# Patient Record
Sex: Female | Born: 1975
Health system: Southern US, Community
[De-identification: ages and names within clinical notes are randomized; demographics above are authoritative.]

## PROBLEM LIST (undated history)

## (undated) DIAGNOSIS — R454 Irritability and anger: Secondary | ICD-10-CM

## (undated) DIAGNOSIS — M545 Low back pain, unspecified: Secondary | ICD-10-CM

## (undated) DIAGNOSIS — O3410 Maternal care for benign tumor of corpus uteri, unspecified trimester: Secondary | ICD-10-CM

## (undated) DIAGNOSIS — D259 Leiomyoma of uterus, unspecified: Secondary | ICD-10-CM

## (undated) DIAGNOSIS — F419 Anxiety disorder, unspecified: Secondary | ICD-10-CM

## (undated) DIAGNOSIS — I10 Essential (primary) hypertension: Secondary | ICD-10-CM

## (undated) DIAGNOSIS — F32A Depression, unspecified: Secondary | ICD-10-CM

## (undated) DIAGNOSIS — F439 Reaction to severe stress, unspecified: Secondary | ICD-10-CM

## (undated) DIAGNOSIS — J45909 Unspecified asthma, uncomplicated: Secondary | ICD-10-CM

## (undated) DIAGNOSIS — G43909 Migraine, unspecified, not intractable, without status migrainosus: Secondary | ICD-10-CM

## (undated) HISTORY — PX: TONSILLECTOMY: SUR1361

## (undated) HISTORY — PX: ABDOMINAL HYSTERECTOMY: SUR658

## (undated) HISTORY — PX: MYOMECTOMY: SHX85

## (undated) HISTORY — DX: Irritability and anger: R45.4

## (undated) HISTORY — DX: Depression, unspecified: F32.A

## (undated) HISTORY — DX: Anxiety disorder, unspecified: F41.9

## (undated) HISTORY — PX: ABDOMINAL HYSTERECTOMY: SHX81

---

## 2000-08-20 ENCOUNTER — Encounter: Payer: Self-pay | Admitting: Emergency Medicine

## 2000-08-20 ENCOUNTER — Emergency Department (HOSPITAL_COMMUNITY): Admission: EM | Admit: 2000-08-20 | Discharge: 2000-08-20 | Payer: Self-pay | Admitting: Emergency Medicine

## 2001-04-17 ENCOUNTER — Emergency Department (HOSPITAL_COMMUNITY): Admission: EM | Admit: 2001-04-17 | Discharge: 2001-04-17 | Payer: Self-pay | Admitting: Emergency Medicine

## 2001-06-28 ENCOUNTER — Emergency Department (HOSPITAL_COMMUNITY): Admission: EM | Admit: 2001-06-28 | Discharge: 2001-06-29 | Payer: Self-pay | Admitting: Emergency Medicine

## 2001-07-16 ENCOUNTER — Encounter: Admission: RE | Admit: 2001-07-16 | Discharge: 2001-07-16 | Payer: Self-pay | Admitting: Internal Medicine

## 2001-07-19 ENCOUNTER — Ambulatory Visit (HOSPITAL_COMMUNITY): Admission: RE | Admit: 2001-07-19 | Discharge: 2001-07-19 | Payer: Self-pay | Admitting: Internal Medicine

## 2001-07-19 ENCOUNTER — Encounter: Payer: Self-pay | Admitting: Internal Medicine

## 2001-10-06 ENCOUNTER — Emergency Department (HOSPITAL_COMMUNITY): Admission: EM | Admit: 2001-10-06 | Discharge: 2001-10-06 | Payer: Self-pay

## 2001-10-07 ENCOUNTER — Emergency Department (HOSPITAL_COMMUNITY): Admission: EM | Admit: 2001-10-07 | Discharge: 2001-10-08 | Payer: Self-pay | Admitting: Emergency Medicine

## 2001-10-21 ENCOUNTER — Other Ambulatory Visit: Admission: RE | Admit: 2001-10-21 | Discharge: 2001-10-21 | Payer: Self-pay | Admitting: Obstetrics and Gynecology

## 2001-11-25 ENCOUNTER — Emergency Department (HOSPITAL_COMMUNITY): Admission: EM | Admit: 2001-11-25 | Discharge: 2001-11-25 | Payer: Self-pay | Admitting: Emergency Medicine

## 2001-12-02 ENCOUNTER — Encounter: Admission: RE | Admit: 2001-12-02 | Discharge: 2001-12-02 | Payer: Self-pay | Admitting: Internal Medicine

## 2002-01-27 ENCOUNTER — Encounter: Admission: RE | Admit: 2002-01-27 | Discharge: 2002-01-27 | Payer: Self-pay | Admitting: Internal Medicine

## 2002-09-01 ENCOUNTER — Ambulatory Visit (HOSPITAL_COMMUNITY): Admission: RE | Admit: 2002-09-01 | Discharge: 2002-09-01 | Payer: Self-pay | Admitting: *Deleted

## 2002-10-06 ENCOUNTER — Encounter: Payer: Self-pay | Admitting: Obstetrics & Gynecology

## 2002-10-06 ENCOUNTER — Ambulatory Visit (HOSPITAL_COMMUNITY): Admission: RE | Admit: 2002-10-06 | Discharge: 2002-10-06 | Payer: Self-pay | Admitting: Obstetrics & Gynecology

## 2002-11-11 ENCOUNTER — Encounter: Payer: Self-pay | Admitting: Obstetrics & Gynecology

## 2002-11-11 ENCOUNTER — Ambulatory Visit (HOSPITAL_COMMUNITY): Admission: RE | Admit: 2002-11-11 | Discharge: 2002-11-11 | Payer: Self-pay | Admitting: Obstetrics & Gynecology

## 2002-12-19 ENCOUNTER — Ambulatory Visit (HOSPITAL_COMMUNITY): Admission: RE | Admit: 2002-12-19 | Discharge: 2002-12-19 | Payer: Self-pay | Admitting: Obstetrics & Gynecology

## 2002-12-19 ENCOUNTER — Encounter: Payer: Self-pay | Admitting: Obstetrics & Gynecology

## 2003-01-29 ENCOUNTER — Inpatient Hospital Stay (HOSPITAL_COMMUNITY): Admission: AD | Admit: 2003-01-29 | Discharge: 2003-02-01 | Payer: Self-pay | Admitting: *Deleted

## 2003-01-29 ENCOUNTER — Encounter (INDEPENDENT_AMBULATORY_CARE_PROVIDER_SITE_OTHER): Payer: Self-pay | Admitting: *Deleted

## 2003-10-30 ENCOUNTER — Emergency Department (HOSPITAL_COMMUNITY): Admission: EM | Admit: 2003-10-30 | Discharge: 2003-10-30 | Payer: Self-pay | Admitting: *Deleted

## 2003-11-15 ENCOUNTER — Emergency Department (HOSPITAL_COMMUNITY): Admission: EM | Admit: 2003-11-15 | Discharge: 2003-11-16 | Payer: Self-pay | Admitting: Emergency Medicine

## 2003-11-26 ENCOUNTER — Emergency Department (HOSPITAL_COMMUNITY): Admission: EM | Admit: 2003-11-26 | Discharge: 2003-11-26 | Payer: Self-pay | Admitting: Emergency Medicine

## 2003-12-02 ENCOUNTER — Emergency Department (HOSPITAL_COMMUNITY): Admission: EM | Admit: 2003-12-02 | Discharge: 2003-12-02 | Payer: Self-pay | Admitting: Family Medicine

## 2004-02-05 ENCOUNTER — Emergency Department (HOSPITAL_COMMUNITY): Admission: EM | Admit: 2004-02-05 | Discharge: 2004-02-05 | Payer: Self-pay | Admitting: Emergency Medicine

## 2004-02-06 ENCOUNTER — Emergency Department (HOSPITAL_COMMUNITY): Admission: EM | Admit: 2004-02-06 | Discharge: 2004-02-06 | Payer: Self-pay | Admitting: Emergency Medicine

## 2004-04-19 ENCOUNTER — Emergency Department (HOSPITAL_COMMUNITY): Admission: EM | Admit: 2004-04-19 | Discharge: 2004-04-19 | Payer: Self-pay | Admitting: Emergency Medicine

## 2004-06-01 ENCOUNTER — Emergency Department (HOSPITAL_COMMUNITY): Admission: EM | Admit: 2004-06-01 | Discharge: 2004-06-01 | Payer: Self-pay | Admitting: Family Medicine

## 2004-11-06 ENCOUNTER — Emergency Department (HOSPITAL_COMMUNITY): Admission: EM | Admit: 2004-11-06 | Discharge: 2004-11-06 | Payer: Self-pay | Admitting: Emergency Medicine

## 2004-12-26 ENCOUNTER — Emergency Department (HOSPITAL_COMMUNITY): Admission: EM | Admit: 2004-12-26 | Discharge: 2004-12-26 | Payer: Self-pay | Admitting: Emergency Medicine

## 2005-01-30 ENCOUNTER — Emergency Department (HOSPITAL_COMMUNITY): Admission: EM | Admit: 2005-01-30 | Discharge: 2005-01-31 | Payer: Self-pay | Admitting: Emergency Medicine

## 2005-02-18 ENCOUNTER — Ambulatory Visit (HOSPITAL_COMMUNITY): Admission: RE | Admit: 2005-02-18 | Discharge: 2005-02-18 | Payer: Self-pay | Admitting: *Deleted

## 2005-03-05 ENCOUNTER — Ambulatory Visit: Payer: Self-pay | Admitting: *Deleted

## 2005-03-05 ENCOUNTER — Inpatient Hospital Stay (HOSPITAL_COMMUNITY): Admission: AD | Admit: 2005-03-05 | Discharge: 2005-03-06 | Payer: Self-pay | Admitting: Obstetrics and Gynecology

## 2005-05-02 ENCOUNTER — Ambulatory Visit (HOSPITAL_COMMUNITY): Admission: RE | Admit: 2005-05-02 | Discharge: 2005-05-02 | Payer: Self-pay | Admitting: *Deleted

## 2005-05-30 ENCOUNTER — Inpatient Hospital Stay (HOSPITAL_COMMUNITY): Admission: AD | Admit: 2005-05-30 | Discharge: 2005-05-30 | Payer: Self-pay | Admitting: *Deleted

## 2005-05-30 ENCOUNTER — Ambulatory Visit: Payer: Self-pay | Admitting: Family Medicine

## 2005-06-11 ENCOUNTER — Ambulatory Visit: Payer: Self-pay | Admitting: Family Medicine

## 2005-06-11 ENCOUNTER — Inpatient Hospital Stay (HOSPITAL_COMMUNITY): Admission: AD | Admit: 2005-06-11 | Discharge: 2005-06-11 | Payer: Self-pay | Admitting: *Deleted

## 2005-06-28 ENCOUNTER — Inpatient Hospital Stay (HOSPITAL_COMMUNITY): Admission: AD | Admit: 2005-06-28 | Discharge: 2005-06-28 | Payer: Self-pay | Admitting: Obstetrics and Gynecology

## 2005-07-10 ENCOUNTER — Encounter (INDEPENDENT_AMBULATORY_CARE_PROVIDER_SITE_OTHER): Payer: Self-pay | Admitting: Specialist

## 2005-07-10 ENCOUNTER — Inpatient Hospital Stay (HOSPITAL_COMMUNITY): Admission: RE | Admit: 2005-07-10 | Discharge: 2005-07-14 | Payer: Self-pay | Admitting: Obstetrics & Gynecology

## 2005-07-10 ENCOUNTER — Ambulatory Visit: Payer: Self-pay | Admitting: Obstetrics & Gynecology

## 2005-07-16 ENCOUNTER — Ambulatory Visit: Payer: Self-pay | Admitting: Gynecology

## 2005-07-23 ENCOUNTER — Ambulatory Visit: Payer: Self-pay | Admitting: Obstetrics & Gynecology

## 2005-11-22 ENCOUNTER — Emergency Department (HOSPITAL_COMMUNITY): Admission: EM | Admit: 2005-11-22 | Discharge: 2005-11-22 | Payer: Self-pay | Admitting: Emergency Medicine

## 2006-03-31 ENCOUNTER — Emergency Department (HOSPITAL_COMMUNITY): Admission: EM | Admit: 2006-03-31 | Discharge: 2006-04-01 | Payer: Self-pay | Admitting: Emergency Medicine

## 2006-11-20 ENCOUNTER — Emergency Department (HOSPITAL_COMMUNITY): Admission: EM | Admit: 2006-11-20 | Discharge: 2006-11-20 | Payer: Self-pay | Admitting: Emergency Medicine

## 2007-04-18 ENCOUNTER — Emergency Department (HOSPITAL_COMMUNITY): Admission: EM | Admit: 2007-04-18 | Discharge: 2007-04-18 | Payer: Self-pay | Admitting: Emergency Medicine

## 2010-08-02 NOTE — Op Note (Signed)
NAMEKEALY, LEWTER            ACCOUNT NO.:  [id number]   MEDICAL RECORD NO.:  [id number]          PATIENT TYPE:  INP   LOCATION:  9316                          FACILITY:  [facility]   PHYSICIAN:  Angie B. Merlene Morse, MD  DATE OF BIRTH:  30-Nov-1975   DATE OF PROCEDURE:  DATE OF DISCHARGE:                                 OPERATIVE REPORT   PREOPERATIVE DIAGNOSES:  1.  38 and 5 week intrauterine pregnancy.  2.  Previous C section.   POSTOPERATIVE DIAGNOSES:  Not given.   PROCEDURE:  Repeat low transverse cesarean via Pfannenstiel.   SURGEON:  Lesly Dukes, M.D.   ASSISTANTKaroline Caldwell B. Merlene Morse, M.D.   ANESTHESIA:  Spinal.   COMPLICATIONS:  None.   ESTIMATED BLOOD LOSS:  800 mL.   FLUIDS:  3300.   URINE OUTPUT:  20 mL of concentrated urine at the end of the procedure.   INDICATIONS FOR PROCEDURE:  A 35 year old, G4, P2-0-1-2 with previous C  section at 63 and 5 here for repeat C section.   FINDINGS:  Female infant in cephalic presentation. Apgar's are 8 and 9, normal  uterus, tubes and ovaries, except small fibroids noted on the uterine  subserosa.   DESCRIPTION OF PROCEDURE:  The patient was taken to the operating room where  spinal anesthesia was obtained and found to be adequate. She was then  prepped and draped in normal sterile fashion, dorsal supine position with  leftward tilt. A Pfannenstiel skin incision was then made with a scalpel and  carried through to the underlying layer of fascia with the Bovie. The fascia  was incised in the midline, excision extended laterally with the Mayo  scissors. The patient had significant scarring of the superior aspect  ___________ was then grasped with Kocher clamps, elevated and underlying  rectus muscles were resected off bluntly. The rectus muscle was separated in  the midline, the peritoneum identified, tented up and entered sharply with  Metzenbaum scissors. The peritoneal incision was extended superiorly and  inferiorly  with good visualization of the bladder. A bladder blade was then  inserted. The vesicouterine peritoneum was identified and grasped with  pickups and entered sharply with the Metzenbaum scissors. The incision was  then extended laterally digitally. The bladder blade was then reinserted and  the lower uterine segment incised in a transverse fashion with the scalpel.  The uterine incision was then extended laterally with the ___________ and  infant's head delivered atraumatically with the assistance of a vacuum. Nose  and mouth were suctioned clear with bulb syringe, cord clamped and cut. The  infant was handed off to the waiting physician. Cord blood was sent. The  placenta was removed manually and uterus exteriorized, cleared of all clots  and debris. The uterine incision was repaired in a running locked fashion.  Gutters were cleared of all clots and the fascia was reapproximated in a  running fashion. The skin was closed with staples. The patient tolerated the  procedure well. Sponge, lap and needle count were correct x2. One gram of  Ancef was given at cord clamp. The patient was taken  to the recovery room in  stable condition.           ______________________________  August Saucer Merlene Morse, MD     ABC/MEDQ  D:  07/10/2005  T:  07/11/2005  Job:  [id number]

## 2010-08-02 NOTE — Discharge Summary (Signed)
NAMEMarland Kitchen  SAMAN, GIDDENS            ACCOUNT NO.:  [id number]   MEDICAL RECORD NO.:  [id number]          PATIENT TYPE:  INP   LOCATION:                                [facility]  [facility]   PHYSICIAN:  Angie B. Merlene Morse, MD  DATE OF BIRTH:  [birth year]   DATE OF ADMISSION:  DATE OF DISCHARGE:                                 DISCHARGE SUMMARY   ADDENDUM:  Significant scarring was identified. A malar incision was  performed to allow for more visualization and good hemostasis was obtained.  Inner seed was placed on the uterus.   In addition to the significant scarring, the patient may benefit from a  vertical incision later and future C-section.           ______________________________  August Saucer Merlene Morse, MD     ABC/MEDQ  D:  07/14/2005  T:  07/14/2005  Job:  [id number]

## 2010-08-02 NOTE — Discharge Summary (Signed)
NAME:  Shirley [name]                 ACCOUNT NO.:  [id number]   MEDICAL RECORD NO.:  [id number]          PATIENT TYPE:  INP   LOCATION:                                [facility]   PHYSICIAN:  Angie B. Merlene Morse, MD  DATE OF BIRTH:  11-10-1975   DATE OF ADMISSION:  07/10/2005  DATE OF DISCHARGE:  07/14/2005                                 DISCHARGE SUMMARY   HISTORY OF PRESENT ILLNESS:  The patient is a 35 year old G4, P2-0-1-2 who  presented for repeat  C-section at 38-5/7 weeks, who wished to put the baby up for adoption.  Please see history and physical for more information.   HOSPITAL COURSE:  The patient was admitted.  She underwent a C-section done  by Dr. Penne Lash on April 26.  She remained stable after her C-section.  On  April 29 she started complaining of left-sided numbness which she states was  her left upper and lower extremity as well as her trunk and abdomen.  She  denied any head numbness.  Anesthesia was consulted.  They felt that it was  unrelated to her anesthesia.  The patient did have a BMP which was  essentially normal with the exception of a slightly low calcium as well as a  B12 which was 226.  She was found to be anemic and started on iron.  The  patient was reassured about the numbness as it later on April 29 extended  from her shoulder to her elbow and her hip to her knee.  She was reassured  that this did not fit any neurological pattern and was counseled that it  would get better over time.  The numbness was intermittent and lasted at the  most 20 minutes.   DISPOSITION:  The patient was discharged to home.   FOLLOW UP:  She was to follow up with [facility] on May 2 at 11  a.m. for staple removal.  The patient will return to [facility] in six  weeks.   DISCHARGE MEDICATIONS:  Percocet, ibuprofen, iron, Colace.           ______________________________  August Saucer. Merlene Morse, MD     ABC/MEDQ  D:  07/14/2005  T:  07/14/2005  Job:   [id number]

## 2010-08-02 NOTE — Discharge Summary (Signed)
NAMEDario [name]                          ACCOUNT NO.:  [id number]   MEDICAL RECORD NO.:  [id number]                   PATIENT TYPE:  INP   LOCATION:  9304                                 [facility]   PHYSICIAN:  Charles A. Clearance Coots, M.D.             DATE OF BIRTH:  1976/03/16   DATE OF ADMISSION:  01/29/2003  DATE OF DISCHARGE:                                 DISCHARGE SUMMARY   ADMITTING DIAGNOSIS:  Intrauterine pregnancy at term with previous dystocia  at delivery.  The patient desirous of a primary cesarean section.   DISCHARGE DIAGNOSES:  1. Intrauterine pregnancy at term with previous dystocia at delivery.  The     patient desirous of a primary cesarean section.  2. Status post primary low transverse cesarean section.  3. Viable female delivered on January 29, 2003 at 0931.  Apgars of 9 at one     minute and 9 at five minutes, weight of 3770 grams, length of 51.5 cm.     Mother and infant discharged home in good condition.   REASON FOR ADMISSION:  A 35 year old black female at term presented to  [facility] with uterine contractions.  The patient had been scheduled  for a primary low transverse cesarean section on the following Monday  because of previous dystocia with her last vaginal delivery and did not want  to attempt another vaginal delivery.  It was agreed that cesarean section  delivery would be performed for this delivery.  The patient was therefore  taken for a primary cesarean section.   PAST MEDICAL HISTORY:  1. Surgery:  None.  2. Illnesses:  Asthma, depression.   MEDICATIONS:  Prenatal vitamins.   ALLERGIES:  No known drug allergies.   SOCIAL HISTORY:  Single.  Negative tobacco, alcohol, or recreational drug  use.   FAMILY HISTORY:  Noncontributory.   PHYSICAL EXAMINATION:  GENERAL:  Well-nourished, well-developed black female  in no acute distress.  VITAL SIGNS:  She is afebrile and vital signs are stable.  LUNGS:  Clear to auscultation  bilaterally.  HEART:  Regular rate and rhythm.  ABDOMEN:  Gravid, soft, nontender.  PELVIC:  Cervix on examination was 1-2 cm, 50%, and the vertex was at a -2  station.   ADMITTING LABORATORY VALUES:  Hemoglobin 12.3; hematocrit 36; white blood  cell count 14,000; platelets 221,000.   HOSPITAL COURSE:  The patient underwent a primary low transverse cesarean  section on January 29, 2003.  There were no intraoperative complications.  Postoperative course was uncomplicated and the patient was discharged home  on postoperative day #3 in good condition.   DISCHARGE LABORATORY VALUES:  Hemoglobin 9.4; hematocrit 27.7; white cell  count 11,000; platelets 185,000.   DISCHARGE DISPOSITION:  1. Medications:  Tylox, ibuprofen, and iron were prescribed.  2. The patient was given routine written instructions for obstetrical     discharge after cesarean section.  3. She  is to follow up at the [facility] in six weeks.                                               Charles A. Clearance Coots, M.D.    CAH/MEDQ  D:  02/01/2003  T:  02/01/2003  Job:  [id number]

## 2010-08-02 NOTE — Op Note (Signed)
   NAMEDario [name]                          ACCOUNT NO.:  [id number]   MEDICAL RECORD NO.:  [id number]                   PATIENT TYPE:   LOCATION:                                       [facility]   PHYSICIAN:  Kathreen Cosier, M.D.           DATE OF BIRTH:  [birth year]   DATE OF PROCEDURE:  01/29/2003  DATE OF DISCHARGE:                                 OPERATIVE REPORT   PREOPERATIVE DIAGNOSIS:  Intrauterine pregnancy at term with a previous  macrosomic baby.  The patient desired cesarean section.   SURGEON:  Kathreen Cosier, M.D.   ANESTHESIA:  Spinal.   DESCRIPTION OF PROCEDURE:  The patient was placed on the operating table in  the supine position after the abdomen had been prepped and draped.  The  bladder was emptied with a Foley catheter.  A transverse suprapubic incision  was made and carried down through the rectus fascia.  The fascia was cleaned  and incised the length of the incision.  The recti muscles were retracted  laterally.  The peritoneum was incised longitudinally.  A transverse  incision was made in the distal peritoneum above the bladder and the bladder  mobilized inferiorly.  A transverse lower uterine incision was made.  The  fluid was meconium stained.  The baby was DeLee suctioned prior to delivery  of the shoulders.  There was a nuchal cord present x 1.  The team was in  attendance.  She was a vertex LOA.  The baby weighed 8 pounds 5 ounces with  Apgars of 4 and 9 __________.  The placenta was posterior, removed manually,  and sent to pathology.  The uterine cavity was cleaned with dry laps.  The  uterine incision was closed in one layer with continuous suture of 1-0  chromic.  The bladder flap with reattached with 2-0 chromic.  The uterus was  well contracted.  The tubes and ovaries were normal.  There was a 1 cm  midline fundal myoma and there was a 4 cm fundal myoma on the right.  The  lap and sponge counts were correct.  The abdomen was  closed in layers; the  peritoneum with continuous suture of 0 chromic, the fascia with continuous  suture of 0 Dexon, and the skin closed with subcuticular stitch of 3-0  Monocryl.  Blood loss 600 mL.  The patient tolerated the procedure well and  was taken to the recovery room in good condition.                                               Kathreen Cosier, M.D.    BAM/MEDQ  D:  01/29/2003  T:  01/29/2003  Job:  [id number]

## 2010-08-02 NOTE — H&P (Signed)
   NAME:  Dario Guardian                          ACCOUNT NO.:  [id number]   MEDICAL RECORD NO.:  [id number]                   PATIENT TYPE:  INP   LOCATION:  NA                                   [facility]   PHYSICIAN:  Kathreen Cosier, M.D.           DATE OF BIRTH:  01/19/1976   DATE OF ADMISSION:  01/29/2003  DATE OF DISCHARGE:                                HISTORY & PHYSICAL   HISTORY OF PRESENT ILLNESS:  The patient is a 35 year old gravida 4, para 1-  0-2-1 who had a macrosomic baby the first time and had some fetal damage and  she is now at term, [facility] February 01, 2003 and desired cesarean section  elective.  She was admitted in labor, contracting every three minutes.  The  cervix was 3 cm, 50% with the vertex at -2.   PHYSICAL EXAMINATION:  GENERAL:  Obese female in labor.  HEENT:  Negative.  LUNGS:  Clear.  HEART:  Regular rhythm.  No murmurs.  No gallops.  ABDOMEN:  Term sized uterus.  Estimated fetal weight 8 pounds 10 ounces.  BREAST:  No masses.  EXTREMITIES:  Legs:  Negative.                                               Kathreen Cosier, M.D.    BAM/MEDQ  D:  01/29/2003  T:  01/29/2003  Job:  [id number]

## 2010-12-06 LAB — RAPID STREP SCREEN (MED CTR MEBANE ONLY): Streptococcus, Group A Screen (Direct): NEGATIVE

## 2012-12-09 ENCOUNTER — Encounter (HOSPITAL_BASED_OUTPATIENT_CLINIC_OR_DEPARTMENT_OTHER): Payer: Self-pay | Admitting: *Deleted

## 2012-12-09 ENCOUNTER — Emergency Department (HOSPITAL_BASED_OUTPATIENT_CLINIC_OR_DEPARTMENT_OTHER)
Admission: EM | Admit: 2012-12-09 | Discharge: 2012-12-09 | Disposition: A | Discharge status: Home / Self Care | Payer: Medicaid Other | Attending: Emergency Medicine | Admitting: Emergency Medicine

## 2012-12-09 DIAGNOSIS — I10 Essential (primary) hypertension: Secondary | ICD-10-CM | POA: Insufficient documentation

## 2012-12-09 DIAGNOSIS — J45909 Unspecified asthma, uncomplicated: Secondary | ICD-10-CM | POA: Insufficient documentation

## 2012-12-09 DIAGNOSIS — Z91040 Latex allergy status: Secondary | ICD-10-CM | POA: Insufficient documentation

## 2012-12-09 DIAGNOSIS — E876 Hypokalemia: Secondary | ICD-10-CM | POA: Insufficient documentation

## 2012-12-09 DIAGNOSIS — K2970 Gastritis, unspecified, without bleeding: Secondary | ICD-10-CM | POA: Insufficient documentation

## 2012-12-09 HISTORY — DX: Essential (primary) hypertension: I10

## 2012-12-09 HISTORY — DX: Unspecified asthma, uncomplicated: J45.909

## 2012-12-09 HISTORY — DX: Reaction to severe stress, unspecified: F43.9

## 2012-12-09 LAB — CBC WITH DIFFERENTIAL/PLATELET
Basophils Absolute: 0 10*3/uL (ref 0.0–0.1)
Basophils Relative: 0 % (ref 0–1)
Eosinophils Absolute: 0.2 10*3/uL (ref 0.0–0.7)
Eosinophils Relative: 2 % (ref 0–5)
HCT: 39.3 % (ref 36.0–46.0)
MCH: 27.7 pg (ref 26.0–34.0)
MCHC: 33.6 g/dL (ref 30.0–36.0)
MCV: 82.4 fL (ref 78.0–100.0)
Monocytes Absolute: 0.7 10*3/uL (ref 0.1–1.0)
Neutro Abs: 6.7 10*3/uL (ref 1.7–7.7)
RDW: 13.9 % (ref 11.5–15.5)

## 2012-12-09 LAB — URINALYSIS, ROUTINE W REFLEX MICROSCOPIC
Glucose, UA: NEGATIVE mg/dL
Leukocytes, UA: NEGATIVE
Nitrite: NEGATIVE
pH: 5.5 (ref 5.0–8.0)

## 2012-12-09 LAB — COMPREHENSIVE METABOLIC PANEL
AST: 35 U/L (ref 0–37)
Albumin: 4.7 g/dL (ref 3.5–5.2)
BUN: 20 mg/dL (ref 6–23)
Calcium: 10.4 mg/dL (ref 8.4–10.5)
Creatinine, Ser: 0.9 mg/dL (ref 0.50–1.10)
Total Protein: 7.9 g/dL (ref 6.0–8.3)

## 2012-12-09 LAB — LIPASE, BLOOD: Lipase: 32 U/L (ref 11–59)

## 2012-12-09 MED ORDER — POTASSIUM CHLORIDE CRYS ER 20 MEQ PO TBCR
40.0000 meq | EXTENDED_RELEASE_TABLET | Freq: Once | ORAL | Status: AC
  Administered 2012-12-09: 40 meq via ORAL
  Filled 2012-12-09: qty 2

## 2012-12-09 MED ORDER — LANSOPRAZOLE 30 MG PO CPDR: 30.0000 mg | DELAYED_RELEASE_CAPSULE | Freq: Every day | ORAL | Status: DC

## 2012-12-09 MED ORDER — ONDANSETRON 4 MG PO TBDP
4.0000 mg | ORAL_TABLET | Freq: Once | ORAL | Status: AC
  Administered 2012-12-09: 4 mg via ORAL
  Filled 2012-12-09: qty 1

## 2012-12-09 MED ORDER — ONDANSETRON 4 MG PO TBDP: ORAL_TABLET | ORAL | Status: DC

## 2012-12-09 MED ORDER — GI COCKTAIL ~~LOC~~
30.0000 mL | Freq: Once | ORAL | Status: AC
  Administered 2012-12-09: 30 mL via ORAL
  Filled 2012-12-09: qty 30

## 2012-12-09 NOTE — ED Provider Notes (Signed)
CSN: [id number]     Arrival date & time 12/09/12  1242 History   First MD Initiated Contact with Patient 12/09/12 1253     Chief Complaint  Patient presents with  . Dizziness   (Consider location/radiation/quality/duration/timing/severity/associated sxs/prior Treatment) HPI Patient states that she has had a history of a gastric ulcer in the past. She has been under increased stress lately do to school. Yesterday she was feeling nausea and epigastric pain. She also states that she had mild lightheadedness. This morning after class she had an episode of vomiting she does a very small amount of red streaks in the vomit. She's had no coffee-ground emesis and no melena. She denies taking any ibuprofen or other NSAIDs. She's had no fevers or chills. She states she still has mild lightheadedness especially when standing. Her abdominal pain has improved. She denies persistent cough, shortness of breath or chest pain. Patient has had a hysterectomy. She denies urinary symptoms.  Past Medical History  Diagnosis Date  . Hypertension   . Asthma   . Stress    History reviewed. No pertinent past surgical history. No family history on file. History  Substance Use Topics  . Smoking status: Not on file  . Smokeless tobacco: Not on file  . Alcohol Use: Not on file   OB History   Grav Para Term Preterm Abortions TAB SAB Ect Mult Living                 Review of Systems  Constitutional: Negative for fever and chills.  Respiratory: Positive for cough. Negative for shortness of breath and wheezing.   Cardiovascular: Negative for chest pain and palpitations.  Gastrointestinal: Positive for nausea, vomiting and abdominal pain. Negative for diarrhea, constipation and blood in stool.  Genitourinary: Negative for dysuria, vaginal bleeding and pelvic pain.  Musculoskeletal: Negative for back pain.  Skin: Negative for rash and wound.  Neurological: Positive for dizziness and light-headedness. Negative for  syncope, weakness, numbness and headaches.  All other systems reviewed and are negative.    Allergies  Latex  Home Medications  No current outpatient prescriptions on file. BP 143/90  Pulse 66  Temp(Src) 98.8 F (37.1 C) (Oral)  Resp 16  Ht 5' 6.5" (1.689 m)  Wt 255 lb (115.667 kg)  BMI 40.55 kg/m2  SpO2 100% Physical Exam  Nursing note and vitals reviewed. Constitutional: She is oriented to person, place, and time. She appears well-developed and well-nourished. No distress.  HENT:  Head: Normocephalic and atraumatic.  Mouth/Throat: Oropharynx is clear and moist.  Eyes: EOM are normal. Pupils are equal, round, and reactive to light.  No nystagmus  Neck: Normal range of motion. Neck supple.  Cardiovascular: Normal rate and regular rhythm.   Pulmonary/Chest: Effort normal and breath sounds normal. No respiratory distress. She has no wheezes. She has no rales.  Abdominal: Soft. Bowel sounds are normal. She exhibits no distension and no mass. There is tenderness (mild epigastric tenderness to palpation.). There is no rebound and no guarding.  Musculoskeletal: Normal range of motion. She exhibits no edema and no tenderness.  swelling or tenderness.  Neurological: She is alert and oriented to person, place, and time.  Patient is alert and oriented x3 with clear, goal oriented speech. Patient has 5/5 motor in all extremities. Sensation is intact to light touch. Patient has a normal gait and walks without assistance.   Skin: Skin is warm and dry. No rash noted. No erythema.  Psychiatric: She has a normal mood and  affect. Her behavior is normal.    ED Course  Procedures (including critical care time) Labs Review Labs Reviewed  CBC WITH DIFFERENTIAL  COMPREHENSIVE METABOLIC PANEL  LIPASE, BLOOD  URINALYSIS, ROUTINE W REFLEX MICROSCOPIC   Imaging Review No results found.  Date: 12/09/2012  Rate: 60  Rhythm: normal sinus rhythm  QRS Axis: normal  Intervals: normal  ST/T  Wave abnormalities: normal  Conduction Disutrbances:none  Narrative Interpretation:   Old EKG Reviewed: unchanged   MDM    On further questioning patient says she's had chronic symptoms of gastrointestinal reflux disease. She is to see a gastroenterologist. She has no more episodes of dizziness in the emergency department. Her epigastric pain is resolved after GI cocktail. Her hemoglobin vital signs are normal. We replace her potassium in the emergency department. Start the patient on PPI. I've given a handout on dietary and behavioral modification to improve symptoms. I've given return precautions which include worsening or persistent blood in vomit, melena, syncope, worsening abdominal pain, fevers or any concerns. patient has been given a referral to the gastroenterologist on call. She is been advised to followup for persistent symptoms.  Loren Racer, MD 12/09/12 [phone]

## 2012-12-09 NOTE — ED Notes (Signed)
States her nausea is better 

## 2012-12-09 NOTE — ED Notes (Signed)
Nausea last night. Today at school she had an episode of nausea when she strained to vomit a small amount of gastric contents and noticed streaks of blood. Afterward she coughed and noted streaks of blood in the sputum.

## 2013-05-19 ENCOUNTER — Encounter (HOSPITAL_BASED_OUTPATIENT_CLINIC_OR_DEPARTMENT_OTHER): Payer: Self-pay | Admitting: Emergency Medicine

## 2013-05-19 ENCOUNTER — Emergency Department (HOSPITAL_BASED_OUTPATIENT_CLINIC_OR_DEPARTMENT_OTHER)
Admission: EM | Admit: 2013-05-19 | Discharge: 2013-05-19 | Disposition: A | Discharge status: Home / Self Care | Payer: Medicaid Other | Attending: Emergency Medicine | Admitting: Emergency Medicine

## 2013-05-19 ENCOUNTER — Emergency Department (HOSPITAL_BASED_OUTPATIENT_CLINIC_OR_DEPARTMENT_OTHER): Payer: Medicaid Other

## 2013-05-19 DIAGNOSIS — J45909 Unspecified asthma, uncomplicated: Secondary | ICD-10-CM | POA: Insufficient documentation

## 2013-05-19 DIAGNOSIS — J3489 Other specified disorders of nose and nasal sinuses: Secondary | ICD-10-CM | POA: Insufficient documentation

## 2013-05-19 DIAGNOSIS — R519 Headache, unspecified: Secondary | ICD-10-CM

## 2013-05-19 DIAGNOSIS — R05 Cough: Secondary | ICD-10-CM

## 2013-05-19 DIAGNOSIS — Z79899 Other long term (current) drug therapy: Secondary | ICD-10-CM | POA: Insufficient documentation

## 2013-05-19 DIAGNOSIS — I10 Essential (primary) hypertension: Secondary | ICD-10-CM | POA: Insufficient documentation

## 2013-05-19 DIAGNOSIS — F17200 Nicotine dependence, unspecified, uncomplicated: Secondary | ICD-10-CM | POA: Insufficient documentation

## 2013-05-19 DIAGNOSIS — R059 Cough, unspecified: Secondary | ICD-10-CM

## 2013-05-19 DIAGNOSIS — R51 Headache: Secondary | ICD-10-CM | POA: Insufficient documentation

## 2013-05-19 LAB — RAPID STREP SCREEN (MED CTR MEBANE ONLY): Streptococcus, Group A Screen (Direct): NEGATIVE

## 2013-05-19 MED ORDER — HYDROCOD POLST-CHLORPHEN POLST 10-8 MG/5ML PO LQCR: 5.0000 mL | Freq: Two times a day (BID) | ORAL | Status: DC | PRN

## 2013-05-19 MED ORDER — SODIUM CHLORIDE 0.9 % IV BOLUS (SEPSIS)
1000.0000 mL | Freq: Once | INTRAVENOUS | Status: AC
  Administered 2013-05-19: 1000 mL via INTRAVENOUS

## 2013-05-19 MED ORDER — DIPHENHYDRAMINE HCL 50 MG/ML IJ SOLN
25.0000 mg | Freq: Once | INTRAMUSCULAR | Status: AC
  Administered 2013-05-19: 25 mg via INTRAVENOUS
  Filled 2013-05-19: qty 1

## 2013-05-19 MED ORDER — METOCLOPRAMIDE HCL 5 MG/ML IJ SOLN
10.0000 mg | Freq: Once | INTRAMUSCULAR | Status: AC
  Administered 2013-05-19: 10 mg via INTRAVENOUS
  Filled 2013-05-19: qty 2

## 2013-05-19 MED ORDER — KETOROLAC TROMETHAMINE 30 MG/ML IJ SOLN
30.0000 mg | Freq: Once | INTRAMUSCULAR | Status: AC
  Administered 2013-05-19: 30 mg via INTRAVENOUS
  Filled 2013-05-19: qty 2

## 2013-05-19 MED ORDER — DEXAMETHASONE SODIUM PHOSPHATE 10 MG/ML IJ SOLN
10.0000 mg | Freq: Once | INTRAMUSCULAR | Status: AC
  Administered 2013-05-19: 10 mg via INTRAVENOUS
  Filled 2013-05-19: qty 1

## 2013-05-19 NOTE — Discharge Instructions (Signed)
As discussed, your evaluation today has been largely reassuring.  But, it is important that you monitor your condition carefully, and do not hesitate to return to the ED if you develop new, or concerning changes in your condition. ? ?Otherwise, please follow-up with your physician for appropriate ongoing care. ? ?

## 2013-05-19 NOTE — ED Notes (Signed)
Lights dimmed per pt request. TV on at loud volume, pt requests tv remain on so she can watch. Pt denies any further needs at this time.

## 2013-05-19 NOTE — ED Notes (Signed)
Patient returned from X-ray 

## 2013-05-19 NOTE — ED Provider Notes (Signed)
CSN: [id number]     Arrival date & time 05/19/13  0726 History   First MD Initiated Contact with Patient 05/19/13 0730     Chief Complaint  Patient presents with  . Sore Throat  . Cough  . Nasal Congestion      HPI  Patient presents with concern of a headache, cough, congestion, sore throat.  Symptoms began at 1.5 weeks ago.  Since onset symptoms have been persistent. Patient has not taken any medication for relief thus far. Patient notes that the headache is right sided, nonradiating, sore, throbbing.  Lungs there is no associated visual loss, confusion, syncope, vomiting or No unilateral weakness. Patient has one sick child in her family. Patient has a history of migraines, states that this headache is similar to all prior episodes. The patient has not seen a neurologist, but does see a primary care physician for migraine management.   Past Medical History  Diagnosis Date  . Hypertension   . Asthma   . Stress    Past Surgical History  Procedure Laterality Date  . Abdominal hysterectomy    . Tonsillectomy     History reviewed. No pertinent family history. History  Substance Use Topics  . Smoking status: Current Some Day Smoker    Types: Cigarettes  . Smokeless tobacco: Not on file  . Alcohol Use: No   OB History   Grav Para Term Preterm Abortions TAB SAB Ect Mult Living                 Review of Systems  Constitutional:       Per HPI, otherwise negative  HENT:       Per HPI, otherwise negative  Respiratory:       Per HPI, otherwise negative  Cardiovascular:       Per HPI, otherwise negative  Gastrointestinal: Negative for nausea, vomiting and abdominal pain.  Endocrine:       Negative aside from HPI  Genitourinary:       Neg aside from HPI   Musculoskeletal:       Per HPI, otherwise negative  Skin: Negative.   Neurological: Positive for headaches. Negative for syncope.      Allergies  Latex and Peanuts  Home Medications   Current Outpatient Rx   Name  Route  Sig  Dispense  Refill  . ALBUTEROL IN   Inhalation   Inhale into the lungs.         Marland Kitchen HYDROCHLOROTHIAZIDE PO   Oral   Take by mouth.         . lansoprazole (PREVACID) 30 MG capsule   Oral   Take 1 capsule (30 mg total) by mouth daily.   30 capsule   0   . LORazepam (ATIVAN PO)   Oral   Take by mouth.         . ondansetron (ZOFRAN ODT) 4 MG disintegrating tablet      4mg  ODT q4 hours prn nausea/vomit   8 tablet   0    BP 121/76  Pulse 79  Temp(Src) 98.4 F (36.9 C) (Oral)  Resp 16  Ht 5' 6.5" (1.689 m)  Wt 207 lb (93.895 kg)  BMI 32.91 kg/m2  SpO2 99% Physical Exam  Nursing note and vitals reviewed. Constitutional: She is oriented to person, place, and time. She appears well-developed and well-nourished. No distress.  HENT:  Head: Normocephalic and atraumatic.  Eyes: Conjunctivae and EOM are normal.  Cardiovascular: Normal rate and regular rhythm.  Pulmonary/Chest: Effort normal and breath sounds normal. No stridor. No respiratory distress.  Persistent cough  Abdominal: She exhibits no distension.  Musculoskeletal: She exhibits no edema.  Neurological: She is alert and oriented to person, place, and time. No cranial nerve deficit.  Skin: Skin is warm and dry.  Psychiatric: She has a normal mood and affect.    ED Course  Procedures (including critical care time) Labs Review Labs Reviewed  RAPID STREP SCREEN   I saw the x-ray, agree with the interpretation.  No notable findings.  9:01 AM Patient feeling "much better."  MDM   This patient with a history of migraines presents with one week of URI like symptoms and headache.  The patient is neurologically intact hemodynamically stable, and in no distress, though she continues to complain of cough.  Patient's pain improved.  Absent red flags, there is low suspicion for occult systemic infection or imminent decompensation.  Patient has a primary care physician.  She'll follow up with that  individual as well as with neurology.  Carmin Muskrat, MD 05/19/13 [phone]

## 2013-05-19 NOTE — ED Notes (Signed)
Pt amb to room 11 with quick steady gait in nad. Pt reports 10 days of cough, congestion and sore throat, her son recently dx with strep.

## 2013-05-19 NOTE — ED Notes (Signed)
Patient transported to X-ray 

## 2013-05-19 NOTE — ED Notes (Signed)
Pt calling family member to arrange ride home from ER.

## 2013-05-21 LAB — CULTURE, GROUP A STREP

## 2013-06-02 ENCOUNTER — Encounter: Payer: Self-pay | Admitting: Neurology

## 2013-06-02 ENCOUNTER — Ambulatory Visit (INDEPENDENT_AMBULATORY_CARE_PROVIDER_SITE_OTHER): Payer: Medicaid Other | Admitting: Neurology

## 2013-06-02 VITALS — BP 130/80 | HR 76 | Temp 97.9°F | Ht 66.0 in | Wt 257.0 lb

## 2013-06-02 DIAGNOSIS — R51 Headache: Secondary | ICD-10-CM

## 2013-06-02 DIAGNOSIS — G4440 Drug-induced headache, not elsewhere classified, not intractable: Secondary | ICD-10-CM | POA: Insufficient documentation

## 2013-06-02 DIAGNOSIS — I10 Essential (primary) hypertension: Secondary | ICD-10-CM

## 2013-06-02 DIAGNOSIS — R519 Headache, unspecified: Secondary | ICD-10-CM | POA: Insufficient documentation

## 2013-06-02 MED ORDER — ZOLMITRIPTAN 5 MG NA SOLN: 1.0000 | NASAL | Status: DC | PRN

## 2013-06-02 MED ORDER — PREDNISONE 10 MG PO TABS: ORAL_TABLET | ORAL | Status: DC

## 2013-06-02 NOTE — Patient Instructions (Addendum)
1. MRI brain without contrast April 6 @ 3pm please arrive 15 minutes prior for check in. [facility] Pontes [phone]  2. Start Prednisone taper: 10mg  tabs, take 6 tabs on day 1, 5 tabs on day 2, 4 tabs on day 3, 3 tabs on day 4, 2 tabs on days 5 and 6, 1 tab on days 7 and 8 3. As needed Zomig nasal spray, use at onset of headache, may use second dose after 1 hour. Do not use more than 3 times a week 4. Minimize use of as needed rescue medication 5. Start magnesium 400mg  daily and riboflavin 400mg  daily as supplements to help with headaches 6. Follow-up in 6 week
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
## 2013-06-03 ENCOUNTER — Telehealth: Payer: Self-pay | Admitting: Neurology

## 2013-06-03 MED ORDER — ONDANSETRON 4 MG PO TBDP: ORAL_TABLET | ORAL | Status: DC

## 2013-06-03 NOTE — Telephone Encounter (Signed)
Pt called and wanted to give you the medication she was on this is her spelling of them  omeprozle 20 mg heart burn  nortiptyline 25 mg depression  omeprozle 500 mg migraine Naproxen 500 mg  Oxycodone back pain

## 2013-06-03 NOTE — Addendum Note (Signed)
Addended by: Melissa Noon C on: 06/03/2013 10:20 AM   Modules accepted: Orders

## 2013-06-03 NOTE — Telephone Encounter (Signed)
Pt called/returning your call at 10:50AM.

## 2013-06-03 NOTE — Telephone Encounter (Signed)
updated

## 2013-06-03 NOTE — Addendum Note (Signed)
Addended by: Melissa Noon C on: 06/03/2013 11:06 AM   Modules accepted: Orders

## 2013-06-20 ENCOUNTER — Ambulatory Visit (HOSPITAL_COMMUNITY)
Admission: RE | Admit: 2013-06-20 | Discharge: 2013-06-20 | Disposition: A | Discharge status: Home / Self Care | Payer: Medicaid Other | Source: Ambulatory Visit | Attending: Neurology | Admitting: Neurology

## 2013-06-20 DIAGNOSIS — R51 Headache: Secondary | ICD-10-CM | POA: Insufficient documentation

## 2013-06-22 ENCOUNTER — Telehealth: Payer: Self-pay | Admitting: Neurology

## 2013-06-22 NOTE — Telephone Encounter (Signed)
Patient called.

## 2013-06-22 NOTE — Telephone Encounter (Signed)
Pt said someone had called her and she was returning the call please call [phone]

## 2013-11-09 ENCOUNTER — Emergency Department (HOSPITAL_BASED_OUTPATIENT_CLINIC_OR_DEPARTMENT_OTHER): Payer: Medicaid Other

## 2013-11-09 ENCOUNTER — Encounter (HOSPITAL_BASED_OUTPATIENT_CLINIC_OR_DEPARTMENT_OTHER): Payer: Self-pay | Admitting: Emergency Medicine

## 2013-11-09 ENCOUNTER — Emergency Department (HOSPITAL_BASED_OUTPATIENT_CLINIC_OR_DEPARTMENT_OTHER)
Admission: EM | Admit: 2013-11-09 | Discharge: 2013-11-09 | Disposition: A | Discharge status: Home / Self Care | Payer: Medicaid Other | Attending: Emergency Medicine | Admitting: Emergency Medicine

## 2013-11-09 DIAGNOSIS — Z91040 Latex allergy status: Secondary | ICD-10-CM | POA: Diagnosis not present

## 2013-11-09 DIAGNOSIS — J45909 Unspecified asthma, uncomplicated: Secondary | ICD-10-CM | POA: Diagnosis not present

## 2013-11-09 DIAGNOSIS — Z791 Long term (current) use of non-steroidal anti-inflammatories (NSAID): Secondary | ICD-10-CM | POA: Diagnosis not present

## 2013-11-09 DIAGNOSIS — F17200 Nicotine dependence, unspecified, uncomplicated: Secondary | ICD-10-CM | POA: Insufficient documentation

## 2013-11-09 DIAGNOSIS — S99919A Unspecified injury of unspecified ankle, initial encounter: Secondary | ICD-10-CM | POA: Diagnosis present

## 2013-11-09 DIAGNOSIS — Z79899 Other long term (current) drug therapy: Secondary | ICD-10-CM | POA: Diagnosis not present

## 2013-11-09 DIAGNOSIS — S8990XA Unspecified injury of unspecified lower leg, initial encounter: Secondary | ICD-10-CM | POA: Diagnosis present

## 2013-11-09 DIAGNOSIS — S93409A Sprain of unspecified ligament of unspecified ankle, initial encounter: Secondary | ICD-10-CM | POA: Diagnosis not present

## 2013-11-09 DIAGNOSIS — I10 Essential (primary) hypertension: Secondary | ICD-10-CM | POA: Diagnosis not present

## 2013-11-09 DIAGNOSIS — IMO0002 Reserved for concepts with insufficient information to code with codable children: Secondary | ICD-10-CM | POA: Insufficient documentation

## 2013-11-09 DIAGNOSIS — Y9389 Activity, other specified: Secondary | ICD-10-CM | POA: Insufficient documentation

## 2013-11-09 DIAGNOSIS — S82899A Other fracture of unspecified lower leg, initial encounter for closed fracture: Secondary | ICD-10-CM | POA: Diagnosis not present

## 2013-11-09 DIAGNOSIS — X500XXA Overexertion from strenuous movement or load, initial encounter: Secondary | ICD-10-CM | POA: Diagnosis not present

## 2013-11-09 DIAGNOSIS — S93402A Sprain of unspecified ligament of left ankle, initial encounter: Secondary | ICD-10-CM

## 2013-11-09 DIAGNOSIS — S99929A Unspecified injury of unspecified foot, initial encounter: Secondary | ICD-10-CM

## 2013-11-09 DIAGNOSIS — Y9289 Other specified places as the place of occurrence of the external cause: Secondary | ICD-10-CM | POA: Insufficient documentation

## 2013-11-09 DIAGNOSIS — S82892A Other fracture of left lower leg, initial encounter for closed fracture: Secondary | ICD-10-CM

## 2013-11-09 MED ORDER — NAPROXEN 500 MG PO TABS: 500.0000 mg | ORAL_TABLET | Freq: Two times a day (BID) | ORAL | Status: DC

## 2013-11-09 NOTE — ED Provider Notes (Signed)
CSN: [id number]     Arrival date & time 11/09/13  1101 History   First MD Initiated Contact with Patient 11/09/13 1115     Chief Complaint  Patient presents with  . Ankle Injury     (Consider location/radiation/quality/duration/timing/severity/associated sxs/prior Treatment) HPI Comments: 2 weeks of L ankle pain/edema after inversion injury on steps 2 weeks ago. Both sides of ankle hurt, lateral > medial  Patient is a 38 y.o. female presenting with lower extremity injury. The history is provided by the patient. No language interpreter was used.  Ankle Injury This is a new problem. The current episode started more than 1 week ago (2 weeks). The problem occurs constantly. The problem has not changed since onset.Pertinent negatives include no chest pain, no abdominal pain, no headaches and no shortness of breath. The symptoms are aggravated by walking and standing. Nothing relieves the symptoms. She has tried rest for the symptoms. The treatment provided no relief.    Past Medical History  Diagnosis Date  . Hypertension   . Asthma   . Stress    Past Surgical History  Procedure Laterality Date  . Abdominal hysterectomy    . Tonsillectomy     Family History  Problem Relation Age of Onset  . Ataxia Neg Hx   . Chorea Neg Hx   . Dementia Neg Hx   . Mental retardation Neg Hx   . Migraines Neg Hx   . Multiple sclerosis Neg Hx   . Neurofibromatosis Neg Hx   . Neuropathy Neg Hx   . Parkinsonism Neg Hx   . Seizures Neg Hx   . Stroke Neg Hx    History  Substance Use Topics  . Smoking status: Current Some Day Smoker    Types: Cigarettes  . Smokeless tobacco: Not on file  . Alcohol Use: No   OB History   Grav Para Term Preterm Abortions TAB SAB Ect Mult Living                 Review of Systems  Constitutional: Negative for fever, chills, diaphoresis, activity change, appetite change and fatigue.  HENT: Negative for congestion, facial swelling, rhinorrhea and sore throat.    Eyes: Negative for photophobia and discharge.  Respiratory: Negative for cough, chest tightness and shortness of breath.   Cardiovascular: Negative for chest pain, palpitations and leg swelling.  Gastrointestinal: Negative for nausea, vomiting, abdominal pain and diarrhea.  Endocrine: Negative for polydipsia and polyuria.  Genitourinary: Negative for dysuria, frequency, difficulty urinating and pelvic pain.  Musculoskeletal: Negative for arthralgias, back pain, neck pain and neck stiffness.  Skin: Negative for color change and wound.  Allergic/Immunologic: Negative for immunocompromised state.  Neurological: Negative for facial asymmetry, weakness, numbness and headaches.  Hematological: Does not bruise/bleed easily.  Psychiatric/Behavioral: Negative for confusion and agitation.      Allergies  Latex and Peanuts  Home Medications   Prior to Admission medications   Medication Sig Start Date End Date Taking? Authorizing Provider  ALBUTEROL IN Inhale into the lungs.    Historical Provider, MD  naproxen (NAPROSYN) 500 MG tablet Take 500 mg by mouth 2 (two) times daily with a meal.    Historical Provider, MD  naproxen (NAPROSYN) 500 MG tablet Take 1 tablet (500 mg total) by mouth 2 (two) times daily. 11/09/13   Ernestina Patches, MD  nortriptyline (PAMELOR) 25 MG capsule Take 25 mg by mouth at bedtime.    Historical Provider, MD  omeprazole (PRILOSEC) 20 MG capsule Take 20 mg  by mouth daily.    Historical Provider, MD  ondansetron (ZOFRAN ODT) 4 MG disintegrating tablet 4mg  ODT q4 hours prn nausea/vomit 06/03/13   Cameron Sprang, MD  predniSONE (DELTASONE) 10 MG tablet Take 10 mg by mouth daily with breakfast.    Historical Provider, MD  topiramate (TOPAMAX) 50 MG tablet Take 50 mg by mouth 2 (two) times daily.    Historical Provider, MD  zolmitriptan (ZOMIG) 5 MG nasal solution Place 1 spray into the nose as needed for migraine (spray in nostril at onset of headache, may use second dose after  1 hour. Do not use more than 3 times a week). 06/02/13   Cameron Sprang, MD   BP 138/85  Pulse 65  Temp(Src) 98.1 F (36.7 C) (Oral)  Resp 18  Ht 5\' 5"  (1.651 m)  Wt 207 lb (93.895 kg)  BMI 34.45 kg/m2  SpO2 100% Physical Exam  Constitutional: She is oriented to person, place, and time. She appears well-developed and well-nourished. No distress.  HENT:  Head: Normocephalic and atraumatic.  Mouth/Throat: No oropharyngeal exudate.  Eyes: Pupils are equal, round, and reactive to light.  Neck: Normal range of motion. Neck supple.  Cardiovascular: Normal rate, regular rhythm and normal heart sounds.  Exam reveals no gallop and no friction rub.   No murmur heard. Pulmonary/Chest: Effort normal and breath sounds normal. No respiratory distress. She has no wheezes. She has no rales.  Abdominal: Soft. Bowel sounds are normal. She exhibits no distension and no mass. There is no tenderness. There is no rebound and no guarding.  Musculoskeletal: Normal range of motion. She exhibits no edema.       Left ankle: She exhibits swelling. She exhibits no ecchymosis, no deformity, no laceration and normal pulse. Tenderness. Lateral malleolus and medial malleolus tenderness found. No posterior TFL, no head of 5th metatarsal and no proximal fibula tenderness found.  Neurological: She is alert and oriented to person, place, and time.  Skin: Skin is warm and dry.  Psychiatric: She has a normal mood and affect.    ED Course  Procedures (including critical care time) Labs Review Labs Reviewed - No data to display  Imaging Review Dg Ankle Complete Left  11/09/2013   CLINICAL DATA:  Pain post trauma  EXAM: LEFT ANKLE COMPLETE - 3+ VIEW  COMPARISON:  None.  FINDINGS: Frontal, oblique, and lateral views were obtained. There is soft tissue swelling. There is a small avulsion arising from the lateral malleolus. No other fracture. Ankle mortise appears intact. No appreciable joint effusion. There is a benign  exostosis arising from the dorsal distal talus.  IMPRESSION: Avulsion along the lateral malleolus. Soft tissue swelling. Ankle mortise appears intact.   Electronically Signed   By: Lowella Grip M.D.   On: 11/09/2013 11:40     EKG Interpretation None      MDM   Final diagnoses:  Left ankle sprain, initial encounter  Avulsion fracture of ankle, left, closed, initial encounter    Pt is a 38 y.o. female with Pmhx as above who presents with 2 weeks of L ankle pain/edema after inversion injury on steps 2 weeks ago. NVI distally. No knee pain. +ttp over lateral>medial malleolus. XR w/ avulsion fx of lateral malleolus. Will place in ASO brace, crutches. WBAT, f/u with sports medicine.         Ernestina Patches, MD 11/09/13 1253

## 2013-11-09 NOTE — Discharge Instructions (Signed)
Ankle Fracture A fracture is a break in a bone. A cast or splint may be used to protect the ankle and heal the break. Sometimes, surgery is needed. HOME CARE  Use crutches as told by your doctor. It is very important that you use your crutches correctly.  Do not put weight or pressure on the injured ankle until told by your doctor.  Keep your ankle raised (elevated) when sitting or lying down.  Apply ice to the ankle:  Put ice in a plastic bag.  Place a towel between your cast and the bag.  Leave the ice on for 20 minutes, 2-3 times a day.  If you have a plaster or fiberglass cast:  Do not try to scratch under the cast with any objects.  Check the skin around the cast every day. You may put lotion on red or sore areas.  Keep your cast dry and clean.  If you have a plaster splint:  Wear the splint as told by your doctor.  You can loosen the elastic around the splint if your toes get numb, tingle, or turn cold or blue.  Do not put pressure on any part of your cast or splint. It may break. Rest your plaster splint or cast only on a pillow the first 24 hours until it is fully hardened.  Cover your cast or splint with a plastic bag during showers.  Do not lower your cast or splint into water.  Take medicine as told by your doctor.  Do not drive until your doctor says it is safe.  Follow-up with your doctor as told. It is very important that you go to your follow-up visits. GET HELP IF: The swelling and discomfort gets worse.  GET HELP RIGHT AWAY IF:   Your splint or cast breaks.  You continue to have very bad pain.  You have new pain or swelling after your splint or cast was put on.  Your skin or toes below the injured ankle:  Turn blue or gray.  Feel cold, numb, or you cannot feel them.  There is a bad smell or yellowish white fluid (pus) coming from under the splint or cast. MAKE SURE YOU:   Understand these instructions.  Will watch your  condition.  Will get help right away if you are not doing well or get worse. Document Released: 12/29/2008 Document Revised: 12/22/2012 Document Reviewed: 09/30/2012 [facility] Patient Information 2015 [place], [place]. This information is not intended to replace advice given to you by your health care provider. Make sure you discuss any questions you have with your health care provider.  Ankle Sprain An ankle sprain is an injury to the strong, fibrous tissues (ligaments) that hold the bones of your ankle joint together.  CAUSES An ankle sprain is usually caused by a fall or by twisting your ankle. Ankle sprains most commonly occur when you step on the outer edge of your foot, and your ankle turns inward. People who participate in sports are more prone to these types of injuries.  SYMPTOMS   Pain in your ankle. The pain may be present at rest or only when you are trying to stand or walk.  Swelling.  Bruising. Bruising may develop immediately or within 1 to 2 days after your injury.  Difficulty standing or walking, particularly when turning corners or changing directions. DIAGNOSIS  Your caregiver will ask you details about your injury and perform a physical exam of your ankle to determine if you have an ankle sprain.  During the physical exam, your caregiver will press on and apply pressure to specific areas of your foot and ankle. Your caregiver will try to move your ankle in certain ways. An X-ray exam may be done to be sure a bone was not broken or a ligament did not separate from one of the bones in your ankle (avulsion fracture).  TREATMENT  Certain types of braces can help stabilize your ankle. Your caregiver can make a recommendation for this. Your caregiver may recommend the use of medicine for pain. If your sprain is severe, your caregiver may refer you to a surgeon who helps to restore function to parts of your skeletal system (orthopedist) or a physical therapist. Otero ice to your injury for 1-2 days or as directed by your caregiver. Applying ice helps to reduce inflammation and pain.  Put ice in a plastic bag.  Place a towel between your skin and the bag.  Leave the ice on for 15-20 minutes at a time, every 2 hours while you are awake.  Only take over-the-counter or prescription medicines for pain, discomfort, or fever as directed by your caregiver.  Elevate your injured ankle above the level of your heart as much as possible for 2-3 days.  If your caregiver recommends crutches, use them as instructed. Gradually put weight on the affected ankle. Continue to use crutches or a cane until you can walk without feeling pain in your ankle.  If you have a plaster splint, wear the splint as directed by your caregiver. Do not rest it on anything harder than a pillow for the first 24 hours. Do not put weight on it. Do not get it wet. You may take it off to take a shower or bath.  You may have been given an elastic bandage to wear around your ankle to provide support. If the elastic bandage is too tight (you have numbness or tingling in your foot or your foot becomes cold and blue), adjust the bandage to make it comfortable.  If you have an air splint, you may blow more air into it or let air out to make it more comfortable. You may take your splint off at night and before taking a shower or bath. Wiggle your toes in the splint several times per day to decrease swelling. SEEK MEDICAL CARE IF:   You have rapidly increasing bruising or swelling.  Your toes feel extremely cold or you lose feeling in your foot.  Your pain is not relieved with medicine. SEEK IMMEDIATE MEDICAL CARE IF:  Your toes are numb or blue.  You have severe pain that is increasing. MAKE SURE YOU:   Understand these instructions.  Will watch your condition.  Will get help right away if you are not doing well or get worse. Document Released: 03/03/2005 Document Revised:  11/26/2011 Document Reviewed: 03/15/2011 [facility] Patient Information 2015 [place], [place]. This information is not intended to replace advice given to you by your health care provider. Make sure you discuss any questions you have with your health care provider.

## 2013-11-09 NOTE — ED Notes (Signed)
Left ankle injury 2 weeks ago-cont'd pain, swelling

## 2013-12-28 ENCOUNTER — Encounter (HOSPITAL_BASED_OUTPATIENT_CLINIC_OR_DEPARTMENT_OTHER): Payer: Self-pay | Admitting: Emergency Medicine

## 2013-12-28 ENCOUNTER — Emergency Department (HOSPITAL_BASED_OUTPATIENT_CLINIC_OR_DEPARTMENT_OTHER): Payer: Medicaid Other

## 2013-12-28 ENCOUNTER — Emergency Department (HOSPITAL_BASED_OUTPATIENT_CLINIC_OR_DEPARTMENT_OTHER)
Admission: EM | Admit: 2013-12-28 | Discharge: 2013-12-28 | Disposition: A | Discharge status: Home / Self Care | Payer: Medicaid Other | Attending: Emergency Medicine | Admitting: Emergency Medicine

## 2013-12-28 DIAGNOSIS — Z79899 Other long term (current) drug therapy: Secondary | ICD-10-CM | POA: Diagnosis not present

## 2013-12-28 DIAGNOSIS — Z91040 Latex allergy status: Secondary | ICD-10-CM | POA: Diagnosis not present

## 2013-12-28 DIAGNOSIS — N390 Urinary tract infection, site not specified: Secondary | ICD-10-CM | POA: Diagnosis not present

## 2013-12-28 DIAGNOSIS — M722 Plantar fascial fibromatosis: Secondary | ICD-10-CM | POA: Insufficient documentation

## 2013-12-28 DIAGNOSIS — G43909 Migraine, unspecified, not intractable, without status migrainosus: Secondary | ICD-10-CM

## 2013-12-28 DIAGNOSIS — R102 Pelvic and perineal pain: Secondary | ICD-10-CM | POA: Diagnosis not present

## 2013-12-28 DIAGNOSIS — J45909 Unspecified asthma, uncomplicated: Secondary | ICD-10-CM | POA: Diagnosis not present

## 2013-12-28 DIAGNOSIS — Z791 Long term (current) use of non-steroidal anti-inflammatories (NSAID): Secondary | ICD-10-CM | POA: Diagnosis not present

## 2013-12-28 DIAGNOSIS — Z720 Tobacco use: Secondary | ICD-10-CM | POA: Diagnosis not present

## 2013-12-28 DIAGNOSIS — Z3202 Encounter for pregnancy test, result negative: Secondary | ICD-10-CM | POA: Diagnosis not present

## 2013-12-28 DIAGNOSIS — I10 Essential (primary) hypertension: Secondary | ICD-10-CM | POA: Insufficient documentation

## 2013-12-28 DIAGNOSIS — R109 Unspecified abdominal pain: Secondary | ICD-10-CM | POA: Diagnosis present

## 2013-12-28 LAB — URINALYSIS, ROUTINE W REFLEX MICROSCOPIC
Bilirubin Urine: NEGATIVE
Glucose, UA: NEGATIVE mg/dL
HGB URINE DIPSTICK: NEGATIVE
Ketones, ur: 15 mg/dL — AB
LEUKOCYTES UA: NEGATIVE
NITRITE: NEGATIVE
PROTEIN: NEGATIVE mg/dL
SPECIFIC GRAVITY, URINE: 1.035 — AB (ref 1.005–1.030)
Urobilinogen, UA: 0.2 mg/dL (ref 0.0–1.0)
pH: 5 (ref 5.0–8.0)

## 2013-12-28 LAB — CBC WITH DIFFERENTIAL/PLATELET
Basophils Absolute: 0 10*3/uL (ref 0.0–0.1)
Basophils Relative: 0 % (ref 0–1)
EOS ABS: 0.2 10*3/uL (ref 0.0–0.7)
EOS PCT: 1 % (ref 0–5)
HCT: 41.2 % (ref 36.0–46.0)
Hemoglobin: 13.8 g/dL (ref 12.0–15.0)
LYMPHS ABS: 2.7 10*3/uL (ref 0.7–4.0)
Lymphocytes Relative: 25 % (ref 12–46)
MCH: 27.9 pg (ref 26.0–34.0)
MCHC: 33.5 g/dL (ref 30.0–36.0)
MCV: 83.4 fL (ref 78.0–100.0)
MONO ABS: 0.5 10*3/uL (ref 0.1–1.0)
Monocytes Relative: 5 % (ref 3–12)
Neutro Abs: 7.5 10*3/uL (ref 1.7–7.7)
Neutrophils Relative %: 69 % (ref 43–77)
PLATELETS: 247 10*3/uL (ref 150–400)
RBC: 4.94 MIL/uL (ref 3.87–5.11)
RDW: 14.3 % (ref 11.5–15.5)
WBC: 10.8 10*3/uL — ABNORMAL HIGH (ref 4.0–10.5)

## 2013-12-28 LAB — URINE MICROSCOPIC-ADD ON

## 2013-12-28 LAB — COMPREHENSIVE METABOLIC PANEL
ALT: 41 U/L — AB (ref 0–35)
ANION GAP: 13 (ref 5–15)
AST: 27 U/L (ref 0–37)
Albumin: 4.3 g/dL (ref 3.5–5.2)
Alkaline Phosphatase: 100 U/L (ref 39–117)
BUN: 15 mg/dL (ref 6–23)
CALCIUM: 9.6 mg/dL (ref 8.4–10.5)
CO2: 24 mEq/L (ref 19–32)
CREATININE: 0.8 mg/dL (ref 0.50–1.10)
Chloride: 104 mEq/L (ref 96–112)
GFR calc non Af Amer: >90 mL/min (ref >90)
GLUCOSE: 105 mg/dL — AB (ref 70–99)
Potassium: 4.7 mEq/L (ref 3.7–5.3)
SODIUM: 141 meq/L (ref 137–147)
TOTAL PROTEIN: 7.8 g/dL (ref 6.0–8.3)
Total Bilirubin: 0.3 mg/dL (ref 0.3–1.2)

## 2013-12-28 LAB — PREGNANCY, URINE: Preg Test, Ur: NEGATIVE

## 2013-12-28 MED ORDER — CEPHALEXIN 500 MG PO CAPS: 500.0000 mg | ORAL_CAPSULE | Freq: Four times a day (QID) | ORAL | Status: DC

## 2013-12-28 MED ORDER — NAPROXEN 375 MG PO TABS: 375.0000 mg | ORAL_TABLET | Freq: Two times a day (BID) | ORAL | Status: DC

## 2013-12-28 MED ORDER — NAPROXEN 250 MG PO TABS
250.0000 mg | ORAL_TABLET | Freq: Once | ORAL | Status: AC
  Administered 2013-12-28: 250 mg via ORAL
  Filled 2013-12-28: qty 1

## 2013-12-28 NOTE — ED Provider Notes (Signed)
CSN: [id number]     Arrival date & time 12/28/13  8295 History   First MD Initiated Contact with Patient 12/28/13 [phone]     Chief Complaint  Patient presents with  . Abdominal Pain     (Consider location/radiation/quality/duration/timing/severity/associated sxs/prior Treatment) HPI Multiple complaints 1- migraine headache- headache present for several days.  She has not taken meds as out of meds.  Naproxen usually works.  Headache bitemporal and pain "unable to describe" but like usual migraines.  Comes on without prodrome.  No fever, injury, neck stiffness, or neuro symptoms.  F/ B pmd Dr. Vista Lawman and was sent to headache specialist but did not follow up, because "they never called me back with a follow up appointment."  2- left foot pain- patient states she injured her foot before last visit, then has had left foot pressure which is worse at night.  In am feels like she can hardly put weight on foot and walks on toe, then able to gradually dorsiflex and walk.  No new injury, redness, warmth.  3- lower abdominal pain- several days, crampy midline, no nausea, vomiting, or diarrhea.  Patient states no periods since giving birth nine years ago and thinks hysterectomy.  Nothing make pain better or worse.  No uti symptoms.   Past Medical History  Diagnosis Date  . Hypertension   . Asthma   . Stress    Past Surgical History  Procedure Laterality Date  . Abdominal hysterectomy    . Tonsillectomy     Family History  Problem Relation Age of Onset  . Ataxia Neg Hx   . Chorea Neg Hx   . Dementia Neg Hx   . Mental retardation Neg Hx   . Migraines Neg Hx   . Multiple sclerosis Neg Hx   . Neurofibromatosis Neg Hx   . Neuropathy Neg Hx   . Parkinsonism Neg Hx   . Seizures Neg Hx   . Stroke Neg Hx    History  Substance Use Topics  . Smoking status: Current Some Day Smoker    Types: Cigarettes  . Smokeless tobacco: Not on file  . Alcohol Use: No   OB History   Grav Para Term  Preterm Abortions TAB SAB Ect Mult Living                 Review of Systems  All other systems reviewed and are negative.     Allergies  Latex  Home Medications   Prior to Admission medications   Medication Sig Start Date End Date Taking? Authorizing Provider  ALBUTEROL IN Inhale into the lungs.    Historical Provider, MD  naproxen (NAPROSYN) 500 MG tablet Take 500 mg by mouth 2 (two) times daily with a meal.    Historical Provider, MD  naproxen (NAPROSYN) 500 MG tablet Take 1 tablet (500 mg total) by mouth 2 (two) times daily. 11/09/13   Ernestina Patches, MD  nortriptyline (PAMELOR) 25 MG capsule Take 25 mg by mouth at bedtime.    Historical Provider, MD  omeprazole (PRILOSEC) 20 MG capsule Take 20 mg by mouth daily.    Historical Provider, MD  ondansetron (ZOFRAN ODT) 4 MG disintegrating tablet 4mg  ODT q4 hours prn nausea/vomit 06/03/13   Cameron Sprang, MD  predniSONE (DELTASONE) 10 MG tablet Take 10 mg by mouth daily with breakfast.    Historical Provider, MD  topiramate (TOPAMAX) 50 MG tablet Take 50 mg by mouth 2 (two) times daily.    Historical Provider, MD  zolmitriptan (ZOMIG) 5 MG nasal solution Place 1 spray into the nose as needed for migraine (spray in nostril at onset of headache, may use second dose after 1 hour. Do not use more than 3 times a week). 06/02/13   Cameron Sprang, MD   BP 138/67  Pulse 60  Temp(Src) 98.5 F (36.9 C) (Oral)  Resp 18  Ht 5\' 6"  (1.676 m)  Wt 187 lb (84.823 kg)  BMI 30.20 kg/m2  SpO2 100% Physical Exam  Nursing note and vitals reviewed. Constitutional: She is oriented to person, place, and time. She appears well-developed and well-nourished.  Morbidly obese  HENT:  Head: Normocephalic and atraumatic.  Right Ear: External ear normal.  Left Ear: External ear normal.  Nose: Nose normal.  Mouth/Throat: Oropharynx is clear and moist.  Eyes: Conjunctivae and EOM are normal. Pupils are equal, round, and reactive to light.  Neck: Normal range  of motion. Neck supple.  Cardiovascular: Normal rate, regular rhythm, normal heart sounds and intact distal pulses.   Pulmonary/Chest: Effort normal and breath sounds normal.  Abdominal: Soft. Bowel sounds are normal.  Mild diffuse ttp  Musculoskeletal: Normal range of motion.  Neurological: She is alert and oriented to person, place, and time. She has normal reflexes.  Skin: Skin is warm and dry.  Psychiatric: She has a normal mood and affect. Her behavior is normal. Judgment and thought content normal.    ED Course  Procedures (including critical care time) Labs Review Labs Reviewed  CBC WITH DIFFERENTIAL - Abnormal; Notable for the following:    WBC 10.8 (*)    All other components within normal limits  COMPREHENSIVE METABOLIC PANEL - Abnormal; Notable for the following:    Glucose, Bld 105 (*)    ALT 41 (*)    All other components within normal limits  URINALYSIS, ROUTINE W REFLEX MICROSCOPIC - Abnormal; Notable for the following:    Color, Urine AMBER (*)    APPearance TURBID (*)    Specific Gravity, Urine 1.035 (*)    Ketones, ur 15 (*)    All other components within normal limits  URINE MICROSCOPIC-ADD ON - Abnormal; Notable for the following:    Squamous Epithelial / LPF FEW (*)    Crystals CA OXALATE CRYSTALS (*)    All other components within normal limits  URINE CULTURE  PREGNANCY, URINE    Imaging Review Dg Abd 1 View  12/28/2013   CLINICAL DATA:  Pelvic pain for 2 days. Diarrhea for 4 days. Vomiting for 3 days.  EXAM: ABDOMEN - 1 VIEW  COMPARISON:  None.  FINDINGS: The bowel gas pattern is normal. No radio-opaque calculi or other significant radiographic abnormality are seen.  IMPRESSION: Negative.   Electronically Signed   By: Rolm Baptise M.D.   On: 12/28/2013 10:17     EKG Interpretation None      MDM   Final diagnoses:  Migraine without status migrainosus, not intractable, unspecified migraine type  Plantar fasciitis, left  UTI (lower urinary tract  infection)  Pelvic pain in female    1- migraine- improved here after naprosyn. 2- plantar fasciitis 3- pelvic pain- unclear etiology- possible uti.  Plan keflex and follow up.    Shaune Pollack, MD 12/28/13 [phone]

## 2013-12-28 NOTE — ED Notes (Addendum)
Patient states she has had a migraine for a week (has not taken any medications) having pain in the bottom of her right foot, and having abd pain. Abd pain started two days ago, vomited 3 times and has had diarrhea for 4 days. States pain is in lower mid area. Patient states that she "wants a test to verify that she had a hysterectomy" because she states she still has menstrual cramps. She also states that she was seen last month for a sprained ankle and her ankle no longer hurts, but now has pain towards her toes on the bottom of her foot.

## 2013-12-28 NOTE — Discharge Instructions (Signed)
Migraine Headache A migraine headache is very bad, throbbing pain on one or both sides of your head. Talk to your doctor about what things may bring on (trigger) your migraine headaches. HOME CARE  Only take medicines as told by your doctor.  Lie down in a dark, quiet room when you have a migraine.  Keep a journal to find out if certain things bring on migraine headaches. For example, write down:  What you eat and drink.  How much sleep you get.  Any change to your diet or medicines.  Lessen how much alcohol you drink.  Quit smoking if you smoke.  Get enough sleep.  Lessen any stress in your life.  Keep lights dim if bright lights bother you or make your migraines worse. GET HELP RIGHT AWAY IF:   Your migraine becomes really bad.  You have a fever.  You have a stiff neck.  You have trouble seeing.  Your muscles are weak, or you lose muscle control.  You lose your balance or have trouble walking.  You feel like you will pass out (faint), or you pass out.  You have really bad symptoms that are different than your first symptoms. MAKE SURE YOU:   Understand these instructions.  Will watch your condition.  Will get help right away if you are not doing well or get worse. Document Released: 12/11/2007 Document Revised: 05/26/2011 Document Reviewed: 11/08/2012 [facility] Patient Information 2015 [place], [place]. This information is not intended to replace advice given to you by your health care provider. Make sure you discuss any questions you have with your health care provider. Pelvic Pain Pelvic pain is pain felt below the belly button and between your hips. It can be caused by many different things. It is important to get help right away. This is especially true for severe, sharp, or unusual pain that comes on suddenly.  HOME CARE  Only take medicine as told by your doctor.  Rest as told by your doctor.  Eat a healthy diet, such as fruits, vegetables, and lean  meats.  Drink enough fluids to keep your pee (urine) clear or pale yellow, or as told.  Avoid sex (intercourse) if it causes pain.  Apply warm or cold packs to your lower belly (abdomen). Use the type of pack that helps the pain.  Avoid situations that cause you stress.  Keep a journal to track your pain. Write down:  When the pain started.  Where it is located.  If there are things that seem to be related to the pain, such as food or your period.  Follow up with your doctor as told. GET HELP RIGHT AWAY IF:   You have heavy bleeding from the vagina.  You have more pelvic pain.  You feel lightheaded or pass out (faint).  You have chills.  You have pain when you pee or have blood in your pee.  You cannot stop having watery poop (diarrhea).  You cannot stop throwing up (vomiting).  You have a fever or lasting symptoms for more than 3 days.  You have a fever and your symptoms suddenly get worse.  You are being physically or sexually abused.  Your medicine does not help your pain.  You have fluid (discharge) coming from your vagina that is not normal. MAKE SURE YOU:  Understand these instructions.  Will watch your condition.  Will get help if you are not doing well or get worse. Document Released: 08/20/2007 Document Revised: 09/02/2011 Document Reviewed: 06/23/2011 [employer]  Patient Information 2015 [place]. This information is not intended to replace advice given to you by your health care provider. Make sure you discuss any questions you have with your health care provider. Plantar Fasciitis Plantar fasciitis is a common condition that causes foot pain. It is soreness (inflammation) of the band of tough fibrous tissue on the bottom of the foot that runs from the heel bone (calcaneus) to the ball of the foot. The cause of this soreness may be from excessive standing, poor fitting shoes, running on hard surfaces, being overweight, having an abnormal walk, or  overuse (this is common in runners) of the painful foot or feet. It is also common in aerobic exercise dancers and ballet dancers. SYMPTOMS  Most people with plantar fasciitis complain of:  Severe pain in the morning on the bottom of their foot especially when taking the first steps out of bed. This pain recedes after a few minutes of walking.  Severe pain is experienced also during walking following a long period of inactivity.  Pain is worse when walking barefoot or up stairs DIAGNOSIS   Your caregiver will diagnose this condition by examining and feeling your foot.  Special tests such as X-rays of your foot, are usually not needed. PREVENTION   Consult a sports medicine professional before beginning a new exercise program.  Walking programs offer a good workout. With walking there is a lower chance of overuse injuries common to runners. There is less impact and less jarring of the joints.  Begin all new exercise programs slowly. If problems or pain develop, decrease the amount of time or distance until you are at a comfortable level.  Wear good shoes and replace them regularly.  Stretch your foot and the heel cords at the back of the ankle (Achilles tendon) both before and after exercise.  Run or exercise on even surfaces that are not hard. For example, asphalt is better than pavement.  Do not run barefoot on hard surfaces.  If using a treadmill, vary the incline.  Do not continue to workout if you have foot or joint problems. Seek professional help if they do not improve. HOME CARE INSTRUCTIONS   Avoid activities that cause you pain until you recover.  Use ice or cold packs on the problem or painful areas after working out.  Only take over-the-counter or prescription medicines for pain, discomfort, or fever as directed by your caregiver.  Soft shoe inserts or athletic shoes with air or gel sole cushions may be helpful.  If problems continue or become more severe,  consult a sports medicine caregiver or your own health care provider. Cortisone is a potent anti-inflammatory medication that may be injected into the painful area. You can discuss this treatment with your caregiver. MAKE SURE YOU:   Understand these instructions.  Will watch your condition.  Will get help right away if you are not doing well or get worse. Document Released: 11/26/2000 Document Revised: 05/26/2011 Document Reviewed: 01/26/2008 [facility] Patient Information 2015 [place], [place]. This information is not intended to replace advice given to you by your health care provider. Make sure you discuss any questions you have with your health care provider.

## 2013-12-30 LAB — URINE CULTURE
Colony Count: >=100000
Special Requests: NORMAL

## 2014-05-11 ENCOUNTER — Emergency Department (HOSPITAL_BASED_OUTPATIENT_CLINIC_OR_DEPARTMENT_OTHER)
Admission: EM | Admit: 2014-05-11 | Discharge: 2014-05-11 | Disposition: A | Discharge status: Home / Self Care | Payer: Medicaid Other | Attending: Emergency Medicine | Admitting: Emergency Medicine

## 2014-05-11 ENCOUNTER — Encounter (HOSPITAL_BASED_OUTPATIENT_CLINIC_OR_DEPARTMENT_OTHER): Payer: Self-pay | Admitting: Emergency Medicine

## 2014-05-11 ENCOUNTER — Emergency Department (HOSPITAL_BASED_OUTPATIENT_CLINIC_OR_DEPARTMENT_OTHER): Payer: Medicaid Other

## 2014-05-11 DIAGNOSIS — I10 Essential (primary) hypertension: Secondary | ICD-10-CM | POA: Diagnosis not present

## 2014-05-11 DIAGNOSIS — M549 Dorsalgia, unspecified: Secondary | ICD-10-CM | POA: Insufficient documentation

## 2014-05-11 DIAGNOSIS — Z91040 Latex allergy status: Secondary | ICD-10-CM | POA: Diagnosis not present

## 2014-05-11 DIAGNOSIS — J45909 Unspecified asthma, uncomplicated: Secondary | ICD-10-CM | POA: Insufficient documentation

## 2014-05-11 DIAGNOSIS — R102 Pelvic and perineal pain: Secondary | ICD-10-CM | POA: Diagnosis not present

## 2014-05-11 DIAGNOSIS — Z720 Tobacco use: Secondary | ICD-10-CM | POA: Insufficient documentation

## 2014-05-11 DIAGNOSIS — Z7952 Long term (current) use of systemic steroids: Secondary | ICD-10-CM | POA: Diagnosis not present

## 2014-05-11 DIAGNOSIS — Z791 Long term (current) use of non-steroidal anti-inflammatories (NSAID): Secondary | ICD-10-CM | POA: Insufficient documentation

## 2014-05-11 DIAGNOSIS — R109 Unspecified abdominal pain: Secondary | ICD-10-CM | POA: Diagnosis present

## 2014-05-11 DIAGNOSIS — R1030 Lower abdominal pain, unspecified: Secondary | ICD-10-CM

## 2014-05-11 LAB — CBC WITH DIFFERENTIAL/PLATELET
BASOS ABS: 0 10*3/uL (ref 0.0–0.1)
Basophils Relative: 0 % (ref 0–1)
EOS PCT: 2 % (ref 0–5)
Eosinophils Absolute: 0.1 10*3/uL (ref 0.0–0.7)
HCT: 39.9 % (ref 36.0–46.0)
HEMOGLOBIN: 13.1 g/dL (ref 12.0–15.0)
Lymphocytes Relative: 31 % (ref 12–46)
Lymphs Abs: 2.8 10*3/uL (ref 0.7–4.0)
MCH: 27.9 pg (ref 26.0–34.0)
MCHC: 32.8 g/dL (ref 30.0–36.0)
MCV: 84.9 fL (ref 78.0–100.0)
MONO ABS: 0.6 10*3/uL (ref 0.1–1.0)
Monocytes Relative: 6 % (ref 3–12)
NEUTROS ABS: 5.5 10*3/uL (ref 1.7–7.7)
Neutrophils Relative %: 61 % (ref 43–77)
Platelets: 240 10*3/uL (ref 150–400)
RBC: 4.7 MIL/uL (ref 3.87–5.11)
RDW: 13.8 % (ref 11.5–15.5)
WBC: 9 10*3/uL (ref 4.0–10.5)

## 2014-05-11 LAB — BASIC METABOLIC PANEL
Anion gap: 2 — ABNORMAL LOW (ref 5–15)
BUN: 15 mg/dL (ref 6–23)
CO2: 26 mmol/L (ref 19–32)
Calcium: 9 mg/dL (ref 8.4–10.5)
Chloride: 110 mmol/L (ref 96–112)
Creatinine, Ser: 0.98 mg/dL (ref 0.50–1.10)
GFR calc Af Amer: 83 mL/min — ABNORMAL LOW (ref >90)
GFR calc non Af Amer: 72 mL/min — ABNORMAL LOW (ref >90)
GLUCOSE: 88 mg/dL (ref 70–99)
Potassium: 3.9 mmol/L (ref 3.5–5.1)
SODIUM: 138 mmol/L (ref 135–145)

## 2014-05-11 LAB — PREGNANCY, URINE: Preg Test, Ur: NEGATIVE

## 2014-05-11 LAB — WET PREP, GENITAL
TRICH WET PREP: NONE SEEN
YEAST WET PREP: NONE SEEN

## 2014-05-11 LAB — URINALYSIS, ROUTINE W REFLEX MICROSCOPIC
BILIRUBIN URINE: NEGATIVE
Glucose, UA: NEGATIVE mg/dL
Hgb urine dipstick: NEGATIVE
KETONES UR: NEGATIVE mg/dL
NITRITE: NEGATIVE
PH: 5.5 (ref 5.0–8.0)
Protein, ur: NEGATIVE mg/dL
Specific Gravity, Urine: 1.021 (ref 1.005–1.030)
UROBILINOGEN UA: 0.2 mg/dL (ref 0.0–1.0)

## 2014-05-11 LAB — URINE MICROSCOPIC-ADD ON

## 2014-05-11 LAB — I-STAT CG4 LACTIC ACID, ED: Lactic Acid, Venous: 1.36 mmol/L (ref 0.5–2.0)

## 2014-05-11 MED ORDER — METRONIDAZOLE 500 MG PO TABS: 500.0000 mg | ORAL_TABLET | Freq: Two times a day (BID) | ORAL | Status: DC

## 2014-05-11 MED ORDER — IOHEXOL 300 MG/ML  SOLN
25.0000 mL | Freq: Once | INTRAMUSCULAR | Status: AC | PRN
  Administered 2014-05-11: 25 mL via ORAL

## 2014-05-11 MED ORDER — IOHEXOL 300 MG/ML  SOLN
100.0000 mL | Freq: Once | INTRAMUSCULAR | Status: AC | PRN
  Administered 2014-05-11: 100 mL via INTRAVENOUS

## 2014-05-11 MED ORDER — HYDROCODONE-ACETAMINOPHEN 5-325 MG PO TABS: 2.0000 | ORAL_TABLET | ORAL | Status: DC | PRN

## 2014-05-11 MED ORDER — CEFTRIAXONE SODIUM 250 MG IJ SOLR
250.0000 mg | Freq: Once | INTRAMUSCULAR | Status: AC
  Administered 2014-05-11: 250 mg via INTRAMUSCULAR
  Filled 2014-05-11: qty 250

## 2014-05-11 MED ORDER — SODIUM CHLORIDE 0.9 % IV SOLN
Freq: Once | INTRAVENOUS | Status: AC
  Administered 2014-05-11: 1000 mL via INTRAVENOUS

## 2014-05-11 MED ORDER — LIDOCAINE HCL (PF) 1 % IJ SOLN
INTRAMUSCULAR | Status: AC
  Administered 2014-05-11: 5 mL
  Filled 2014-05-11: qty 5

## 2014-05-11 MED ORDER — AZITHROMYCIN 250 MG PO TABS
1000.0000 mg | ORAL_TABLET | Freq: Once | ORAL | Status: AC
  Administered 2014-05-11: 1000 mg via ORAL
  Filled 2014-05-11: qty 4

## 2014-05-11 NOTE — ED Provider Notes (Signed)
CSN: [id number]     Arrival date & time 05/11/14  9767 History   First MD Initiated Contact with Patient 05/11/14 [phone]     Chief Complaint  Patient presents with  . Abdominal Pain     (Consider location/radiation/quality/duration/timing/severity/associated sxs/prior Treatment) HPI Comments: Patient presents with complaints of back and abdominal pain. Patient thinks these are 2 separate problems, she has been experiencing low back pain for 4 days. Patient reports that she has had chronic intermittent low back pain that has been identical to her symptoms currently. No radiation of pain, no loss of strength or sensation in lower extremities. No change in bowel or bladder function.  Circumflex complaining of low abdominal and pelvic pain. This started 2 days ago. She has associated nausea but no vomiting. She denies diarrhea and constipation. She has not had any urinary frequency, dysuria, hematuria. Denies vaginal bleeding and discharge. Pain is constant but she has a sharp stabbing component that occurs intermittently.  Patient is a 39 y.o. female presenting with abdominal pain.  Abdominal Pain   Past Medical History  Diagnosis Date  . Hypertension   . Asthma   . Stress    Past Surgical History  Procedure Laterality Date  . Tonsillectomy    . Myomectomy     Family History  Problem Relation Age of Onset  . Ataxia Neg Hx   . Chorea Neg Hx   . Dementia Neg Hx   . Mental retardation Neg Hx   . Migraines Neg Hx   . Multiple sclerosis Neg Hx   . Neurofibromatosis Neg Hx   . Neuropathy Neg Hx   . Parkinsonism Neg Hx   . Seizures Neg Hx   . Stroke Neg Hx    History  Substance Use Topics  . Smoking status: Current Some Day Smoker    Types: Cigarettes  . Smokeless tobacco: Not on file  . Alcohol Use: No   OB History    No data available     Review of Systems  Gastrointestinal: Positive for abdominal pain.  Genitourinary: Positive for pelvic pain.  Musculoskeletal:  Positive for back pain.  All other systems reviewed and are negative.     Allergies  Latex  Home Medications   Prior to Admission medications   Medication Sig Start Date End Date Taking? Authorizing Provider  ALBUTEROL IN Inhale into the lungs.    Historical Provider, MD  HYDROcodone-acetaminophen (NORCO/VICODIN) 5-325 MG per tablet Take 2 tablets by mouth every 4 (four) hours as needed for moderate pain. 05/11/14   Orpah Greek, MD  metroNIDAZOLE (FLAGYL) 500 MG tablet Take 1 tablet (500 mg total) by mouth 2 (two) times daily. One po bid x 7 days 05/11/14   Orpah Greek, MD  naproxen (NAPROSYN) 500 MG tablet Take 500 mg by mouth 2 (two) times daily with a meal.    Historical Provider, MD  naproxen (NAPROSYN) 500 MG tablet Take 1 tablet (500 mg total) by mouth 2 (two) times daily. 11/09/13   Ernestina Patches, MD  predniSONE (DELTASONE) 10 MG tablet Take 10 mg by mouth daily with breakfast.    Historical Provider, MD   BP 124/80 mmHg  Pulse 66  Temp(Src) 98 F (36.7 C) (Oral)  Resp 16  Ht 5' 5.5" (1.664 m)  Wt 258 lb 11.2 oz (117.346 kg)  BMI 42.38 kg/m2  SpO2 100% Physical Exam  Constitutional: She is oriented to person, place, and time. She appears well-developed and well-nourished. No distress.  HENT:  Head: Normocephalic and atraumatic.  Right Ear: Hearing normal.  Left Ear: Hearing normal.  Nose: Nose normal.  Mouth/Throat: Oropharynx is clear and moist and mucous membranes are normal.  Eyes: Conjunctivae and EOM are normal. Pupils are equal, round, and reactive to light.  Neck: Normal range of motion. Neck supple.  Cardiovascular: Regular rhythm, S1 normal and S2 normal.  Exam reveals no gallop and no friction rub.   No murmur heard. Pulmonary/Chest: Effort normal and breath sounds normal. No respiratory distress. She exhibits no tenderness.  Abdominal: Soft. Normal appearance and bowel sounds are normal. There is no hepatosplenomegaly. There is  tenderness in the right lower quadrant, suprapubic area and left lower quadrant. There is no rebound, no guarding, no tenderness at McBurney's point and negative Murphy's sign. No hernia.  Genitourinary: Vagina normal. There is no rash or tenderness on the right labia. There is no rash or tenderness on the left labia. Cervix exhibits no motion tenderness, no discharge and no friability. Right adnexum displays no mass. Left adnexum displays no mass.  Musculoskeletal: Normal range of motion.  Neurological: She is alert and oriented to person, place, and time. She has normal strength. No cranial nerve deficit or sensory deficit. Coordination normal. GCS eye subscore is 4. GCS verbal subscore is 5. GCS motor subscore is 6.  Skin: Skin is warm, dry and intact. No rash noted. No cyanosis.  Psychiatric: She has a normal mood and affect. Her speech is normal and behavior is normal. Thought content normal.  Nursing note and vitals reviewed.   ED Course  Procedures (including critical care time) Labs Review Labs Reviewed  WET PREP, GENITAL - Abnormal; Notable for the following:    Clue Cells Wet Prep HPF POC MODERATE (*)    WBC, Wet Prep HPF POC FEW (*)    All other components within normal limits  BASIC METABOLIC PANEL - Abnormal; Notable for the following:    GFR calc non Af Amer 72 (*)    GFR calc Af Amer 83 (*)    Anion gap 2 (*)    All other components within normal limits  URINALYSIS, ROUTINE W REFLEX MICROSCOPIC - Abnormal; Notable for the following:    APPearance CLOUDY (*)    Leukocytes, UA SMALL (*)    All other components within normal limits  URINE MICROSCOPIC-ADD ON - Abnormal; Notable for the following:    Squamous Epithelial / LPF FEW (*)    Bacteria, UA MANY (*)    All other components within normal limits  CBC WITH DIFFERENTIAL/PLATELET  PREGNANCY, URINE  RPR  HIV ANTIBODY (ROUTINE TESTING)  I-STAT CG4 LACTIC ACID, ED  GC/CHLAMYDIA PROBE AMP ([place])    Imaging  Review Ct Abdomen Pelvis W Contrast  05/11/2014   CLINICAL DATA:  LEFT lower quadrant pain for a few days, nausea, hypertension, asthma, smoker, history of cyst and mass removed from uterus 10+ years ago  EXAM: CT ABDOMEN AND PELVIS WITH CONTRAST  TECHNIQUE: Multidetector CT imaging of the abdomen and pelvis was performed using the standard protocol following bolus administration of intravenous contrast. Sagittal and coronal MPR images reconstructed from axial data set.  CONTRAST:  9mL OMNIPAQUE IOHEXOL 300 MG/ML SOLN, 150mL OMNIPAQUE IOHEXOL 300 MG/ML SOLN IV  COMPARISON:  12/19/2007  FINDINGS: Lung bases clear.  Gallbladder contracted.  Small cyst upper pole LEFT kidney 10 mm diameter.  Liver, spleen, pancreas, kidneys, and adrenal glands otherwise unremarkable.  Question minimal food debris within stomach but unable to exclude small  polyp at fundus.  Normal appendix located low in pelvis.  Bowel loops unremarkable.  Bladder decompressed with unremarkable ureters.  No mass, adenopathy, free fluid, inflammatory process or hernia.  Osseous structures unremarkable.  IMPRESSION: Unable to exclude small polyp at gallbladder fundus.  No acute intra-abdominal or intra pelvic inflammatory process.   Electronically Signed   By: Lavonia Dana M.D.   On: 05/11/2014 10:06     EKG Interpretation None      MDM   Final diagnoses:  Lower abdominal pain  Pelvic pain in female    Patient presents to the ER for evaluation of lower abdominal and pelvic pain. Symptoms have been present for 2 days. She has not had urinary symptoms. There is no vaginal discharge. Patient reports previous myomectomy, does not have menstrual cycles. Examination revealed mild tenderness throughout the lower abdominal and pelvis region. No signs of acute surgical process on exam. Pelvic exam revealed no evidence of mass. There was no vaginal discharge. CT scan was therefore performed. No intra-abdominal or intrapelvic process was identified.  Patient does have evidence of vaginosis. She'll be treated empirically with Rocephin, Zithromax here in the ER, analgesia and Flagyl as an outpatient. Follow-up with PCP and OB/GYN.    Orpah Greek, MD 05/11/14 [phone]

## 2014-05-11 NOTE — ED Notes (Signed)
Patient preparing for discharge. 

## 2014-05-11 NOTE — Discharge Instructions (Signed)
Abdominal Pain °Many things can cause abdominal pain. Usually, abdominal pain is not caused by a disease and will improve without treatment. It can often be observed and treated at home. Your health care provider will do a physical exam and possibly order blood tests and X-rays to help determine the seriousness of your pain. However, in many cases, more time must pass before a clear cause of the pain can be found. Before that point, your health care provider may not know if you need more testing or further treatment. °HOME CARE INSTRUCTIONS  °Monitor your abdominal pain for any changes. The following actions may help to alleviate any discomfort you are experiencing: °· Only take over-the-counter or prescription medicines as directed by your health care provider. °· Do not take laxatives unless directed to do so by your health care provider. °· Try a clear liquid diet (broth, tea, or water) as directed by your health care provider. Slowly move to a bland diet as tolerated. °SEEK MEDICAL CARE IF: °· You have unexplained abdominal pain. °· You have abdominal pain associated with nausea or diarrhea. °· You have pain when you urinate or have a bowel movement. °· You experience abdominal pain that wakes you in the night. °· You have abdominal pain that is worsened or improved by eating food. °· You have abdominal pain that is worsened with eating fatty foods. °· You have a fever. °SEEK IMMEDIATE MEDICAL CARE IF:  °· Your pain does not go away within 2 hours. °· You keep throwing up (vomiting). °· Your pain is felt only in portions of the abdomen, such as the right side or the left lower portion of the abdomen. °· You pass bloody or black tarry stools. °MAKE SURE YOU: °· Understand these instructions.   °· Will watch your condition.   °· Will get help right away if you are not doing well or get worse.   °Document Released: 12/11/2004 Document Revised: 03/08/2013 Document Reviewed: 11/10/2012 °[employer]® Patient Information  ©2015 [employer]. This information is not intended to replace advice given to you by your health care provider. Make sure you discuss any questions you have with your health care provider. ° ° ° °Pelvic Pain °Female pelvic pain can be caused by many different things and start from a variety of places. Pelvic pain refers to pain that is located in the lower half of the abdomen and between your hips. The pain may occur over a short period of time (acute) or may be reoccurring (chronic). The cause of pelvic pain may be related to disorders affecting the female reproductive organs (gynecologic), but it may also be related to the bladder, kidney stones, an intestinal complication, or muscle or skeletal problems. Getting help right away for pelvic pain is important, especially if there has been severe, sharp, or a sudden onset of unusual pain. It is also important to get help right away because some types of pelvic pain can be life threatening.  °CAUSES  °Below are only some of the causes of pelvic pain. The causes of pelvic pain can be in one of several categories.  °· Gynecologic. °¨ Pelvic inflammatory disease. °¨ Sexually transmitted infection. °¨ Ovarian cyst or a twisted ovarian ligament (ovarian torsion). °¨ Uterine lining that grows outside the uterus (endometriosis). °¨ Fibroids, cysts, or tumors. °¨ Ovulation. °· Pregnancy. °¨ Pregnancy that occurs outside the uterus (ectopic pregnancy). °¨ Miscarriage. °¨ Labor. °¨ Abruption of the placenta or ruptured uterus. °· Infection. °¨ Uterine infection (endometritis). °¨ Bladder infection. °¨ Diverticulitis. °¨   Miscarriage related to a uterine infection (septic abortion). °· Bladder. °¨ Inflammation of the bladder (cystitis). °¨ Kidney stone(s). °· Gastrointestinal. °¨ Constipation. °¨ Diverticulitis. °· Neurologic. °¨ Trauma. °¨ Feeling pelvic pain because of mental or emotional causes (psychosomatic). °· Cancers of the bowel or pelvis. °EVALUATION  °Your caregiver  will want to take a careful history of your concerns. This includes recent changes in your health, a careful gynecologic history of your periods (menses), and a sexual history. Obtaining your family history and medical history is also important. Your caregiver may suggest a pelvic exam. A pelvic exam will help identify the location and severity of the pain. It also helps in the evaluation of which organ system may be involved. In order to identify the cause of the pelvic pain and be properly treated, your caregiver may order tests. These tests may include:  °· A pregnancy test. °· Pelvic ultrasonography. °· An X-ray exam of the abdomen. °· A urinalysis or evaluation of vaginal discharge. °· Blood tests. °HOME CARE INSTRUCTIONS  °· Only take over-the-counter or prescription medicines for pain, discomfort, or fever as directed by your caregiver.   °· Rest as directed by your caregiver.   °· Eat a balanced diet.   °· Drink enough fluids to make your urine clear or pale yellow, or as directed.   °· Avoid sexual intercourse if it causes pain.   °· Apply warm or cold compresses to the lower abdomen depending on which one helps the pain.   °· Avoid stressful situations.   °· Keep a journal of your pelvic pain. Write down when it started, where the pain is located, and if there are things that seem to be associated with the pain, such as food or your menstrual cycle. °· Follow up with your caregiver as directed.   °SEEK MEDICAL CARE IF: °· Your medicine does not help your pain. °· You have abnormal vaginal discharge. °SEEK IMMEDIATE MEDICAL CARE IF:  °· You have heavy bleeding from the vagina.   °· Your pelvic pain increases.   °· You feel light-headed or faint.   °· You have chills.   °· You have pain with urination or blood in your urine.   °· You have uncontrolled diarrhea or vomiting.   °· You have a fever or persistent symptoms for more than 3 days. °· You have a fever and your symptoms suddenly get worse.   °· You are  being physically or sexually abused.   °MAKE SURE YOU: °· Understand these instructions. °· Will watch your condition. °· Will get help if you are not doing well or get worse. °Document Released: 01/29/2004 Document Revised: 07/18/2013 Document Reviewed: 06/23/2011 °[employer]® Patient Information ©2015 [employer]. This information is not intended to replace advice given to you by your health care provider. Make sure you discuss any questions you have with your health care provider. ° °

## 2014-05-11 NOTE — ED Notes (Addendum)
Lower abdominal pain and some lower back pain (chronic) for several days.  No fever.  Some nausea.  Some dysuria.  No vaginal discharge.

## 2014-05-12 LAB — RPR: RPR: NONREACTIVE

## 2014-05-12 LAB — HIV ANTIBODY (ROUTINE TESTING W REFLEX): HIV Screen 4th Generation wRfx: NONREACTIVE

## 2014-05-12 LAB — GC/CHLAMYDIA PROBE AMP (~~LOC~~) NOT AT ARMC
Chlamydia: NEGATIVE
Neisseria Gonorrhea: NEGATIVE

## 2014-07-25 ENCOUNTER — Ambulatory Visit: Payer: Self-pay | Admitting: Podiatry

## 2014-09-01 ENCOUNTER — Emergency Department (HOSPITAL_BASED_OUTPATIENT_CLINIC_OR_DEPARTMENT_OTHER)
Admission: EM | Admit: 2014-09-01 | Discharge: 2014-09-01 | Disposition: A | Discharge status: Home / Self Care | Payer: Medicaid Other | Attending: Emergency Medicine | Admitting: Emergency Medicine

## 2014-09-01 ENCOUNTER — Encounter (HOSPITAL_BASED_OUTPATIENT_CLINIC_OR_DEPARTMENT_OTHER): Payer: Self-pay | Admitting: *Deleted

## 2014-09-01 ENCOUNTER — Emergency Department (HOSPITAL_BASED_OUTPATIENT_CLINIC_OR_DEPARTMENT_OTHER): Payer: Medicaid Other

## 2014-09-01 DIAGNOSIS — Z91040 Latex allergy status: Secondary | ICD-10-CM | POA: Diagnosis not present

## 2014-09-01 DIAGNOSIS — Y9389 Activity, other specified: Secondary | ICD-10-CM | POA: Diagnosis not present

## 2014-09-01 DIAGNOSIS — Z8659 Personal history of other mental and behavioral disorders: Secondary | ICD-10-CM | POA: Insufficient documentation

## 2014-09-01 DIAGNOSIS — S8392XA Sprain of unspecified site of left knee, initial encounter: Secondary | ICD-10-CM | POA: Insufficient documentation

## 2014-09-01 DIAGNOSIS — J45909 Unspecified asthma, uncomplicated: Secondary | ICD-10-CM | POA: Insufficient documentation

## 2014-09-01 DIAGNOSIS — S29012A Strain of muscle and tendon of back wall of thorax, initial encounter: Secondary | ICD-10-CM | POA: Insufficient documentation

## 2014-09-01 DIAGNOSIS — Y998 Other external cause status: Secondary | ICD-10-CM | POA: Diagnosis not present

## 2014-09-01 DIAGNOSIS — W19XXXA Unspecified fall, initial encounter: Secondary | ICD-10-CM

## 2014-09-01 DIAGNOSIS — Z720 Tobacco use: Secondary | ICD-10-CM | POA: Diagnosis not present

## 2014-09-01 DIAGNOSIS — S39012A Strain of muscle, fascia and tendon of lower back, initial encounter: Secondary | ICD-10-CM

## 2014-09-01 DIAGNOSIS — Z79899 Other long term (current) drug therapy: Secondary | ICD-10-CM | POA: Diagnosis not present

## 2014-09-01 DIAGNOSIS — Y92190 Kitchen in other specified residential institution as the place of occurrence of the external cause: Secondary | ICD-10-CM | POA: Diagnosis not present

## 2014-09-01 DIAGNOSIS — I10 Essential (primary) hypertension: Secondary | ICD-10-CM | POA: Insufficient documentation

## 2014-09-01 DIAGNOSIS — S3992XA Unspecified injury of lower back, initial encounter: Secondary | ICD-10-CM | POA: Diagnosis present

## 2014-09-01 DIAGNOSIS — W1839XA Other fall on same level, initial encounter: Secondary | ICD-10-CM | POA: Diagnosis not present

## 2014-09-01 MED ORDER — CYCLOBENZAPRINE HCL 10 MG PO TABS: 10.0000 mg | ORAL_TABLET | Freq: Two times a day (BID) | ORAL | Status: DC | PRN

## 2014-09-01 MED ORDER — TRAMADOL HCL 50 MG PO TABS: 50.0000 mg | ORAL_TABLET | Freq: Four times a day (QID) | ORAL | Status: DC | PRN

## 2014-09-01 NOTE — ED Notes (Signed)
Pt amb to room 10 with quick steady gait, smiling in nad. Pt reports "moving my leg wrong last night" heard a pop in her left knee, tried to correct herself and fell to the floor onto her left side. Pt reports "my whole left side of my body is in severe pain, from my shoulder to my toes". Pt moving her left arm full rom while explaining the fall. Pt states "I just feel sore all over, especially my back". Denies any head injury or loc.

## 2014-09-01 NOTE — ED Notes (Signed)
Patient transported to X-ray 

## 2014-09-01 NOTE — ED Provider Notes (Signed)
CSN: [id number]     Arrival date & time 09/01/14  3536 History   First MD Initiated Contact with Patient 09/01/14 0805     Chief Complaint  Patient presents with  . Fall     (Consider location/radiation/quality/duration/timing/severity/associated sxs/prior Treatment) Patient is a 39 y.o. female presenting with fall. The history is provided by the patient.  Fall This is a new (Patient was in her kitchen cooking last night when she turned to the left and her knee popped. If she attempted to overcorrect by turning back to the right causing her to fall on her left side onto a tile floor) problem. The current episode started 6 to 12 hours ago. The problem occurs constantly. The problem has not changed since onset.Pertinent negatives include no headaches. Associated symptoms comments: Left sided back pain.  Left knee pain. The symptoms are aggravated by bending and twisting. Nothing relieves the symptoms. Treatments tried: Ibuprofen. The treatment provided no relief.    Past Medical History  Diagnosis Date  . Hypertension   . Asthma   . Stress    Past Surgical History  Procedure Laterality Date  . Tonsillectomy    . Myomectomy     Family History  Problem Relation Age of Onset  . Ataxia Neg Hx   . Chorea Neg Hx   . Dementia Neg Hx   . Mental retardation Neg Hx   . Migraines Neg Hx   . Multiple sclerosis Neg Hx   . Neurofibromatosis Neg Hx   . Neuropathy Neg Hx   . Parkinsonism Neg Hx   . Seizures Neg Hx   . Stroke Neg Hx    History  Substance Use Topics  . Smoking status: Current Some Day Smoker    Types: Cigarettes  . Smokeless tobacco: Not on file  . Alcohol Use: No   OB History    No data available     Review of Systems  Neurological: Negative for headaches.  All other systems reviewed and are negative.     Allergies  Latex  Home Medications   Prior to Admission medications   Medication Sig Start Date End Date Taking? Authorizing Provider  amLODipine  (NORVASC) 5 MG tablet Take 5 mg by mouth daily.   Yes Historical Provider, MD  butalbital-acetaminophen-caffeine (FIORICET, ESGIC) 50-325-40 MG per tablet Take by mouth 2 (two) times daily as needed for headache.   Yes Historical Provider, MD  topiramate (TOPAMAX) 50 MG tablet Take 50 mg by mouth 2 (two) times daily.   Yes Historical Provider, MD  ALBUTEROL IN Inhale into the lungs.    Historical Provider, MD   BP 129/75 mmHg  Pulse 60  Temp(Src) 98 F (36.7 C) (Oral)  Resp 18  Ht 5\' 6"  (1.676 m)  Wt 200 lb (90.719 kg)  BMI 32.30 kg/m2  SpO2 97%  LMP 09/01/2014 Physical Exam  Constitutional: She is oriented to person, place, and time. She appears well-developed and well-nourished. No distress.  HENT:  Head: Normocephalic and atraumatic.  Mouth/Throat: Oropharynx is clear and moist.  Eyes: Conjunctivae and EOM are normal. Pupils are equal, round, and reactive to light.  Neck: Normal range of motion. Neck supple.  Cardiovascular: Normal rate and intact distal pulses.   Pulmonary/Chest: Effort normal.  Musculoskeletal: Normal range of motion. She exhibits tenderness. She exhibits no edema.       Left knee: She exhibits normal range of motion, no swelling, no effusion, no ecchymosis, normal alignment and no LCL laxity. Tenderness found. Medial  joint line tenderness noted.       Thoracic back: She exhibits tenderness, bony tenderness, pain and spasm. She exhibits normal pulse.       Lumbar back: She exhibits tenderness, bony tenderness, pain and spasm.  Neurological: She is alert and oriented to person, place, and time.  Skin: Skin is warm and dry. No rash noted. No erythema.  Psychiatric: She has a normal mood and affect. Her behavior is normal.  Nursing note and vitals reviewed.   ED Course  Procedures (including critical care time) Labs Review Labs Reviewed - No data to display  Imaging Review Dg Thoracic Spine W/swimmers  09/01/2014   CLINICAL DATA:  Fall yesterday with  persistent back pain, initial encounter  EXAM: THORACIC SPINE - 2 VIEW + SWIMMERS  COMPARISON:  None.  FINDINGS: There is no evidence of thoracic spine fracture. Alignment is normal. No other significant bone abnormalities are identified.  IMPRESSION: No acute abnormality noted.   Electronically Signed   By: Inez Catalina M.D.   On: 09/01/2014 08:51   Dg Lumbar Spine Complete  09/01/2014   CLINICAL DATA:  39 year old female with lumbar spine pain and left-sided radiculopathy after falling yesterday  EXAM: LUMBAR SPINE - COMPLETE 4+ VIEW  COMPARISON:  CT abdomen/ pelvis 05/11/2014  FINDINGS: Mild levoconvex scoliosis of the lumbar spine centered at L3. Curvature measures approximately 11 degrees from the superior endplate of L1 to the inferior endplate of L4. No evidence of acute fracture or malalignment. Vertebral body heights are maintained. Minimal degenerative endplate changes. Positive for lower lumbar facet arthropathy at L4-L5 and L5-S1. The visualized bowel gas pattern is unremarkable. No lytic or blastic lesion by conventional radiography.  IMPRESSION: 1. No acute fracture or malalignment. 2. Mild levoconvex scoliosis of the lumbar spine centered at L3. 3. Lower lumbar facet arthropathy.   Electronically Signed   By: Jacqulynn Cadet M.D.   On: 09/01/2014 08:58   Dg Knee Ap/lat W/sunrise Left  09/01/2014   CLINICAL DATA:  Fall yesterday.  Left knee pain.  EXAM: LEFT KNEE 3 VIEWS  COMPARISON:  None.  FINDINGS: Minimal but somewhat advanced for age spurring articular spurring of the patella. Small amount of fluid in the suprapatellar bursa. No acute bony findings.  IMPRESSION: 1. Advanced for age spurring along the patella. Trace knee effusion. No fractures identified on this three view series.   Electronically Signed   By: Van Clines M.D.   On: 09/01/2014 08:48     EKG Interpretation None      MDM   Final diagnoses:  Fall    Patient with a mechanical fall last night after she felt  a pop in her left knee and it gave out.  Patient denies hitting her head or LOC. She's had persistent left-sided back pain and knee pain since the fall. She's been able to ambulate but is very uncomfortable with her back pain. She tried ibuprofen without improvement. On exam she has mostly muscular pain in the left parathoracic and lumbar muscles. However she does have point tenderness on the T and L-spine. Also having pain in the left knee but patient is able to ambulate without swelling or notable effusion. The knee joint is stable on exam.  Imaging pending  9:16 AM Imaging neg.  Pt d/ced home.  Blanchie Dessert, MD 09/01/14 [phone]

## 2014-09-01 NOTE — Discharge Instructions (Signed)
Back Pain, Adult Low back pain is very common. About 1 in 5 people have back pain.The cause of low back pain is rarely dangerous. The pain often gets better over time.About half of people with a sudden onset of back pain feel better in just 2 weeks. About 8 in 10 people feel better by 6 weeks.  CAUSES Some common causes of back pain include:  Strain of the muscles or ligaments supporting the spine.  Wear and tear (degeneration) of the spinal discs.  Arthritis.  Direct injury to the back. DIAGNOSIS Most of the time, the direct cause of low back pain is not known.However, back pain can be treated effectively even when the exact cause of the pain is unknown.Answering your caregiver's questions about your overall health and symptoms is one of the most accurate ways to make sure the cause of your pain is not dangerous. If your caregiver needs more information, he or she may order lab work or imaging tests (X-rays or MRIs).However, even if imaging tests show changes in your back, this usually does not require surgery. HOME CARE INSTRUCTIONS For many people, back pain returns.Since low back pain is rarely dangerous, it is often a condition that people can learn to [facility] their own.   Remain active. It is stressful on the back to sit or stand in one place. Do not sit, drive, or stand in one place for more than 30 minutes at a time. Take short walks on level surfaces as soon as pain allows.Try to increase the length of time you walk each day.  Do not stay in bed.Resting more than 1 or 2 days can delay your recovery.  Do not avoid exercise or work.Your body is made to move.It is not dangerous to be active, even though your back may hurt.Your back will likely heal faster if you return to being active before your pain is gone.  Pay attention to your body when you bend and lift. Many people have less discomfortwhen lifting if they bend their knees, keep the load close to their bodies,and  avoid twisting. Often, the most comfortable positions are those that put less stress on your recovering back.  Find a comfortable position to sleep. Use a firm mattress and lie on your side with your knees slightly bent. If you lie on your back, put a pillow under your knees.  Only take over-the-counter or prescription medicines as directed by your caregiver. Over-the-counter medicines to reduce pain and inflammation are often the most helpful.Your caregiver may prescribe muscle relaxant drugs.These medicines help dull your pain so you can more quickly return to your normal activities and healthy exercise.  Put ice on the injured area.  Put ice in a plastic bag.  Place a towel between your skin and the bag.  Leave the ice on for 15-20 minutes, 03-04 times a day for the first 2 to 3 days. After that, ice and heat may be alternated to reduce pain and spasms.  Ask your caregiver about trying back exercises and gentle massage. This may be of some benefit.  Avoid feeling anxious or stressed.Stress increases muscle tension and can worsen back pain.It is important to recognize when you are anxious or stressed and learn ways to manage it.Exercise is a great option. SEEK MEDICAL CARE IF:  You have pain that is not relieved with rest or medicine.  You have pain that does not improve in 1 week.  You have new symptoms.  You are generally not feeling well. SEEK  IMMEDIATE MEDICAL CARE IF:  °· You have pain that radiates from your back into your legs. °· You develop new bowel or bladder control problems. °· You have unusual weakness or numbness in your arms or legs. °· You develop nausea or vomiting. °· You develop abdominal pain. °· You feel faint. °Document Released: 03/03/2005 Document Revised: 09/02/2011 Document Reviewed: 07/05/2013 °[employer]® Patient Information ©2015 [employer]. This information is not intended to replace advice given to you by your health care provider. Make sure you  discuss any questions you have with your health care provider. ° °Back Exercises °Back exercises help treat and prevent back injuries. The goal is to increase your strength in your belly (abdominal) and back muscles. These exercises can also help with flexibility. Start these exercises when told by your doctor. °HOME CARE °Back exercises include: °Pelvic Tilt. °· Lie on your back with your knees bent. Tilt your pelvis until the lower part of your back is against the floor. Hold this position 5 to 10 sec. Repeat this exercise 5 to 10 times. °Knee to Chest. °· Pull 1 knee up against your chest and hold for 20 to 30 seconds. Repeat this with the other knee. This may be done with the other leg straight or bent, whichever feels better. Then, pull both knees up against your chest. °Sit-Ups or Curl-Ups. °· Bend your knees 90 degrees. Start with tilting your pelvis, and do a partial, slow sit-up. Only lift your upper half 30 to 45 degrees off the floor. Take at least 2 to 3 seonds for each sit-up. Do not do sit-ups with your knees out straight. If partial sit-ups are difficult, simply do the above but with only tightening your belly (abdominal) muscles and holding it as told. °Hip-Lift. °· Lie on your back with your knees flexed 90 degrees. Push down with your feet and shoulders as you raise your hips 2 inches off the floor. Hold for 10 seconds, repeat 5 to 10 times. °Back Arches. °· Lie on your stomach. Prop yourself up on bent elbows. Slowly press on your hands, causing an arch in your low back. Repeat 3 to 5 times. °Shoulder-Lifts. °· Lie face down with arms beside your body. Keep hips and belly pressed to floor as you slowly lift your head and shoulders off the floor. °Do not overdo your exercises. Be careful in the beginning. Exercises may cause you some mild back discomfort. If the pain lasts for more than 15 minutes, stop the exercises until you see your doctor. Improvement with exercise for back problems is slow.    °Document Released: 04/05/2010 Document Revised: 05/26/2011 Document Reviewed: 01/02/2011 °[employer]® Patient Information ©2015 [employer]. This information is not intended to replace advice given to you by your health care provider. Make sure you discuss any questions you have with your health care provider. ° °

## 2014-11-24 ENCOUNTER — Emergency Department (HOSPITAL_BASED_OUTPATIENT_CLINIC_OR_DEPARTMENT_OTHER)
Admission: EM | Admit: 2014-11-24 | Discharge: 2014-11-24 | Disposition: A | Discharge status: Home / Self Care | Payer: Medicaid Other | Attending: Emergency Medicine | Admitting: Emergency Medicine

## 2014-11-24 ENCOUNTER — Encounter (HOSPITAL_BASED_OUTPATIENT_CLINIC_OR_DEPARTMENT_OTHER): Payer: Self-pay | Admitting: Emergency Medicine

## 2014-11-24 DIAGNOSIS — I10 Essential (primary) hypertension: Secondary | ICD-10-CM | POA: Insufficient documentation

## 2014-11-24 DIAGNOSIS — Z91040 Latex allergy status: Secondary | ICD-10-CM | POA: Insufficient documentation

## 2014-11-24 DIAGNOSIS — M545 Low back pain, unspecified: Secondary | ICD-10-CM

## 2014-11-24 DIAGNOSIS — R102 Pelvic and perineal pain: Secondary | ICD-10-CM

## 2014-11-24 DIAGNOSIS — N760 Acute vaginitis: Secondary | ICD-10-CM | POA: Insufficient documentation

## 2014-11-24 DIAGNOSIS — Z79899 Other long term (current) drug therapy: Secondary | ICD-10-CM | POA: Insufficient documentation

## 2014-11-24 DIAGNOSIS — Z8659 Personal history of other mental and behavioral disorders: Secondary | ICD-10-CM | POA: Insufficient documentation

## 2014-11-24 DIAGNOSIS — J45909 Unspecified asthma, uncomplicated: Secondary | ICD-10-CM | POA: Insufficient documentation

## 2014-11-24 DIAGNOSIS — B9689 Other specified bacterial agents as the cause of diseases classified elsewhere: Secondary | ICD-10-CM

## 2014-11-24 DIAGNOSIS — Z720 Tobacco use: Secondary | ICD-10-CM | POA: Insufficient documentation

## 2014-11-24 LAB — URINALYSIS, ROUTINE W REFLEX MICROSCOPIC
Bilirubin Urine: NEGATIVE
Glucose, UA: NEGATIVE mg/dL
Hgb urine dipstick: NEGATIVE
KETONES UR: NEGATIVE mg/dL
LEUKOCYTES UA: NEGATIVE
NITRITE: NEGATIVE
PROTEIN: NEGATIVE mg/dL
Specific Gravity, Urine: 1.025 (ref 1.005–1.030)
Urobilinogen, UA: 0.2 mg/dL (ref 0.0–1.0)
pH: 5.5 (ref 5.0–8.0)

## 2014-11-24 LAB — COMPREHENSIVE METABOLIC PANEL
ALBUMIN: 3.8 g/dL (ref 3.5–5.0)
ALT: 46 U/L (ref 14–54)
ANION GAP: 6 (ref 5–15)
AST: 32 U/L (ref 15–41)
Alkaline Phosphatase: 82 U/L (ref 38–126)
BILIRUBIN TOTAL: 0.4 mg/dL (ref 0.3–1.2)
BUN: 13 mg/dL (ref 6–20)
CO2: 28 mmol/L (ref 22–32)
Calcium: 9 mg/dL (ref 8.9–10.3)
Chloride: 107 mmol/L (ref 101–111)
Creatinine, Ser: 0.75 mg/dL (ref 0.44–1.00)
GFR calc Af Amer: >60 mL/min (ref >60)
Glucose, Bld: 109 mg/dL — ABNORMAL HIGH (ref 65–99)
POTASSIUM: 4.3 mmol/L (ref 3.5–5.1)
Sodium: 141 mmol/L (ref 135–145)
TOTAL PROTEIN: 7 g/dL (ref 6.5–8.1)

## 2014-11-24 LAB — WET PREP, GENITAL
TRICH WET PREP: NONE SEEN
YEAST WET PREP: NONE SEEN

## 2014-11-24 LAB — CBC WITH DIFFERENTIAL/PLATELET
BASOS PCT: 0 % (ref 0–1)
Basophils Absolute: 0 10*3/uL (ref 0.0–0.1)
EOS PCT: 1 % (ref 0–5)
Eosinophils Absolute: 0.1 10*3/uL (ref 0.0–0.7)
HEMATOCRIT: 38 % (ref 36.0–46.0)
Hemoglobin: 12.4 g/dL (ref 12.0–15.0)
Lymphocytes Relative: 36 % (ref 12–46)
Lymphs Abs: 2.3 10*3/uL (ref 0.7–4.0)
MCH: 28.1 pg (ref 26.0–34.0)
MCHC: 32.6 g/dL (ref 30.0–36.0)
MCV: 86.2 fL (ref 78.0–100.0)
MONO ABS: 0.3 10*3/uL (ref 0.1–1.0)
MONOS PCT: 5 % (ref 3–12)
NEUTROS ABS: 3.8 10*3/uL (ref 1.7–7.7)
Neutrophils Relative %: 58 % (ref 43–77)
PLATELETS: 251 10*3/uL (ref 150–400)
RBC: 4.41 MIL/uL (ref 3.87–5.11)
RDW: 13.7 % (ref 11.5–15.5)
WBC: 6.8 10*3/uL (ref 4.0–10.5)

## 2014-11-24 MED ORDER — KETOROLAC TROMETHAMINE 60 MG/2ML IM SOLN
60.0000 mg | Freq: Once | INTRAMUSCULAR | Status: AC
  Administered 2014-11-24: 60 mg via INTRAMUSCULAR
  Filled 2014-11-24: qty 2

## 2014-11-24 MED ORDER — METRONIDAZOLE 500 MG PO TABS: 500.0000 mg | ORAL_TABLET | Freq: Two times a day (BID) | ORAL | Status: DC

## 2014-11-24 NOTE — Discharge Instructions (Signed)
Abdominal Pain, Women °Abdominal (stomach, pelvic, or belly) pain can be caused by many things. It is important to tell your doctor: °· The location of the pain. °· Does it come and go or is it present all the time? °· Are there things that start the pain (eating certain foods, exercise)? °· Are there other symptoms associated with the pain (fever, nausea, vomiting, diarrhea)? °All of this is helpful to know when trying to find the cause of the pain. °CAUSES  °· Stomach: virus or bacteria infection, or ulcer. °· Intestine: appendicitis (inflamed appendix), regional ileitis (Crohn's disease), ulcerative colitis (inflamed colon), irritable bowel syndrome, diverticulitis (inflamed diverticulum of the colon), or cancer of the stomach or intestine. °· Gallbladder disease or stones in the gallbladder. °· Kidney disease, kidney stones, or infection. °· Pancreas infection or cancer. °· Fibromyalgia (pain disorder). °· Diseases of the female organs: °¨ Uterus: fibroid (non-cancerous) tumors or infection. °¨ Fallopian tubes: infection or tubal pregnancy. °¨ Ovary: cysts or tumors. °¨ Pelvic adhesions (scar tissue). °¨ Endometriosis (uterus lining tissue growing in the pelvis and on the pelvic organs). °¨ Pelvic congestion syndrome (female organs filling up with blood just before the menstrual period). °¨ Pain with the menstrual period. °¨ Pain with ovulation (producing an egg). °¨ Pain with an IUD (intrauterine device, birth control) in the uterus. °¨ Cancer of the female organs. °· Functional pain (pain not caused by a disease, may improve without treatment). °· Psychological pain. °· Depression. °DIAGNOSIS  °Your doctor will decide the seriousness of your pain by doing an examination. °· Blood tests. °· X-rays. °· Ultrasound. °· CT scan (computed tomography, special type of X-ray). °· MRI (magnetic resonance imaging). °· Cultures, for infection. °· Barium enema (dye inserted in the large intestine, to better view it with  X-rays). °· Colonoscopy (looking in intestine with a lighted tube). °· Laparoscopy (minor surgery, looking in abdomen with a lighted tube). °· Major abdominal exploratory surgery (looking in abdomen with a large incision). °TREATMENT  °The treatment will depend on the cause of the pain.  °· Many cases can be observed and treated at home. °· Over-the-counter medicines recommended by your caregiver. °· Prescription medicine. °· Antibiotics, for infection. °· Birth control pills, for painful periods or for ovulation pain. °· Hormone treatment, for endometriosis. °· Nerve blocking injections. °· Physical therapy. °· Antidepressants. °· Counseling with a psychologist or psychiatrist. °· Minor or major surgery. °HOME CARE INSTRUCTIONS  °· Do not take laxatives, unless directed by your caregiver. °· Take over-the-counter pain medicine only if ordered by your caregiver. Do not take aspirin because it can cause an upset stomach or bleeding. °· Try a clear liquid diet (broth or water) as ordered by your caregiver. Slowly move to a bland diet, as tolerated, if the pain is related to the stomach or intestine. °· Have a thermometer and take your temperature several times a day, and record it. °· Bed rest and sleep, if it helps the pain. °· Avoid sexual intercourse, if it causes pain. °· Avoid stressful situations. °· Keep your follow-up appointments and tests, as your caregiver orders. °· If the pain does not go away with medicine or surgery, you may try: °¨ Acupuncture. °¨ Relaxation exercises (yoga, meditation). °¨ Group therapy. °¨ Counseling. °SEEK MEDICAL CARE IF:  °· You notice certain foods cause stomach pain. °· Your home care treatment is not helping your pain. °· You need stronger pain medicine. °· You want your IUD removed. °· You feel faint or   lightheaded. °· You develop nausea and vomiting. °· You develop a rash. °· You are having side effects or an allergy to your medicine. °SEEK IMMEDIATE MEDICAL CARE IF:  °· Your  pain does not go away or gets worse. °· You have a fever. °· Your pain is felt only in portions of the abdomen. The right side could possibly be appendicitis. The left lower portion of the abdomen could be colitis or diverticulitis. °· You are passing blood in your stools (bright red or black tarry stools, with or without vomiting). °· You have blood in your urine. °· You develop chills, with or without a fever. °· You pass out. °MAKE SURE YOU:  °· Understand these instructions. °· Will watch your condition. °· Will get help right away if you are not doing well or get worse. °Document Released: 12/29/2006 Document Revised: 07/18/2013 Document Reviewed: 01/18/2009 °Exit[employer]® Patient Information ©2015 [employer]. This information is not intended to replace advice given to you by your health care provider. Make sure you discuss any questions you have with your health care provider. ° °Back Pain, Adult °Low back pain is very common. About 1 in 5 people have back pain. The cause of low back pain is rarely dangerous. The pain often gets better over time. About half of people with a sudden onset of back pain feel better in just 2 weeks. About 8 in 10 people feel better by 6 weeks.  °CAUSES °Some common causes of back pain include: °· Strain of the muscles or ligaments supporting the spine. °· Wear and tear (degeneration) of the spinal discs. °· Arthritis. °· Direct injury to the back. °DIAGNOSIS °Most of the time, the direct cause of low back pain is not known. However, back pain can be treated effectively even when the exact cause of the pain is unknown. Answering your caregiver's questions about your overall health and symptoms is one of the most accurate ways to make sure the cause of your pain is not dangerous. If your caregiver needs more information, he or she may order lab work or imaging tests (X-rays or MRIs). However, even if imaging tests show changes in your back, this usually does not require surgery. °HOME  CARE INSTRUCTIONS °For many people, back pain returns. Since low back pain is rarely dangerous, it is often a condition that people can learn to manage on their own.  °· Remain active. It is stressful on the back to sit or stand in one place. Do not sit, drive, or stand in one place for more than 30 minutes at a time. Take short walks on level surfaces as soon as pain allows. Try to increase the length of time you walk each day. °· Do not stay in bed. Resting more than 1 or 2 days can delay your recovery. °· Do not avoid exercise or work. Your body is made to move. It is not dangerous to be active, even though your back may hurt. Your back will likely heal faster if you return to being active before your pain is gone. °· Pay attention to your body when you  bend and lift. Many people have less discomfort when lifting if they bend their knees, keep the load close to their bodies, and avoid twisting. Often, the most comfortable positions are those that put less stress on your recovering back. °· Find a comfortable position to sleep. Use a firm mattress and lie on your side with your knees slightly bent. If you lie on your back, put a pillow under your knees. °· Only take over-the-counter or prescription medicines as   directed by your caregiver. Over-the-counter medicines to reduce pain and inflammation are often the most helpful. Your caregiver may prescribe muscle relaxant drugs. These medicines help dull your pain so you can more quickly return to your normal activities and healthy exercise. °· Put ice on the injured area. °¨ Put ice in a plastic bag. °¨ Place a towel between your skin and the bag. °¨ Leave the ice on for 15-20 minutes, 03-04 times a day for the first 2 to 3 days. After that, ice and heat may be alternated to reduce pain and spasms. °· Ask your caregiver about trying back exercises and gentle massage. This may be of some benefit. °· Avoid feeling anxious or stressed. Stress increases muscle tension  and can worsen back pain. It is important to recognize when you are anxious or stressed and learn ways to manage it. Exercise is a great option. °SEEK MEDICAL CARE IF: °· You have pain that is not relieved with rest or medicine. °· You have pain that does not improve in 1 week. °· You have new symptoms. °· You are generally not feeling well. °SEEK IMMEDIATE MEDICAL CARE IF:  °· You have pain that radiates from your back into your legs. °· You develop new bowel or bladder control problems. °· You have unusual weakness or numbness in your arms or legs. °· You develop nausea or vomiting. °· You develop abdominal pain. °· You feel faint. °Document Released: 03/03/2005 Document Revised: 09/02/2011 Document Reviewed: 07/05/2013 °ExitCare® Patient Information ©2015 [employer]. This information is not intended to replace advice given to you by your health care provider. Make sure you discuss any questions you have with your health care provider. ° °
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
## 2014-11-24 NOTE — ED Notes (Signed)
Low abd pain and back pain

## 2014-11-27 LAB — GC/CHLAMYDIA PROBE AMP (~~LOC~~) NOT AT ARMC
Chlamydia: NEGATIVE
NEISSERIA GONORRHEA: NEGATIVE

## 2014-12-08 ENCOUNTER — Other Ambulatory Visit (HOSPITAL_COMMUNITY): Payer: Self-pay | Admitting: Emergency Medicine

## 2014-12-08 ENCOUNTER — Emergency Department (HOSPITAL_BASED_OUTPATIENT_CLINIC_OR_DEPARTMENT_OTHER)
Admission: EM | Admit: 2014-12-08 | Discharge: 2014-12-08 | Disposition: A | Discharge status: Home / Self Care | Payer: Medicaid Other | Attending: Emergency Medicine | Admitting: Emergency Medicine

## 2014-12-08 ENCOUNTER — Telehealth (HOSPITAL_BASED_OUTPATIENT_CLINIC_OR_DEPARTMENT_OTHER): Payer: Self-pay | Admitting: *Deleted

## 2014-12-08 ENCOUNTER — Encounter (HOSPITAL_BASED_OUTPATIENT_CLINIC_OR_DEPARTMENT_OTHER): Payer: Self-pay | Admitting: *Deleted

## 2014-12-08 ENCOUNTER — Ambulatory Visit (HOSPITAL_COMMUNITY)
Admit: 2014-12-08 | Discharge: 2014-12-08 | Disposition: A | Discharge status: Home / Self Care | Payer: Medicaid Other | Attending: Emergency Medicine | Admitting: Emergency Medicine

## 2014-12-08 DIAGNOSIS — M545 Low back pain: Secondary | ICD-10-CM | POA: Insufficient documentation

## 2014-12-08 DIAGNOSIS — M5440 Lumbago with sciatica, unspecified side: Secondary | ICD-10-CM

## 2014-12-08 DIAGNOSIS — Z79899 Other long term (current) drug therapy: Secondary | ICD-10-CM | POA: Insufficient documentation

## 2014-12-08 DIAGNOSIS — R52 Pain, unspecified: Secondary | ICD-10-CM

## 2014-12-08 DIAGNOSIS — M4806 Spinal stenosis, lumbar region: Secondary | ICD-10-CM | POA: Insufficient documentation

## 2014-12-08 DIAGNOSIS — Z3202 Encounter for pregnancy test, result negative: Secondary | ICD-10-CM | POA: Insufficient documentation

## 2014-12-08 DIAGNOSIS — I10 Essential (primary) hypertension: Secondary | ICD-10-CM | POA: Insufficient documentation

## 2014-12-08 DIAGNOSIS — J45909 Unspecified asthma, uncomplicated: Secondary | ICD-10-CM | POA: Insufficient documentation

## 2014-12-08 DIAGNOSIS — Z720 Tobacco use: Secondary | ICD-10-CM | POA: Insufficient documentation

## 2014-12-08 DIAGNOSIS — Z91040 Latex allergy status: Secondary | ICD-10-CM | POA: Insufficient documentation

## 2014-12-08 DIAGNOSIS — M5136 Other intervertebral disc degeneration, lumbar region: Secondary | ICD-10-CM | POA: Insufficient documentation

## 2014-12-08 LAB — URINALYSIS, ROUTINE W REFLEX MICROSCOPIC
Bilirubin Urine: NEGATIVE
GLUCOSE, UA: NEGATIVE mg/dL
HGB URINE DIPSTICK: NEGATIVE
KETONES UR: NEGATIVE mg/dL
Nitrite: NEGATIVE
PROTEIN: NEGATIVE mg/dL
Specific Gravity, Urine: 1.026 (ref 1.005–1.030)
UROBILINOGEN UA: 0.2 mg/dL (ref 0.0–1.0)
pH: 5.5 (ref 5.0–8.0)

## 2014-12-08 LAB — URINE MICROSCOPIC-ADD ON

## 2014-12-08 LAB — PREGNANCY, URINE: PREG TEST UR: NEGATIVE

## 2014-12-08 MED ORDER — HYDROCODONE-ACETAMINOPHEN 5-325 MG PO TABS: 2.0000 | ORAL_TABLET | ORAL | Status: DC | PRN

## 2014-12-08 MED ORDER — IBUPROFEN 600 MG PO TABS: 600.0000 mg | ORAL_TABLET | Freq: Four times a day (QID) | ORAL | Status: DC | PRN

## 2014-12-08 NOTE — ED Notes (Signed)
Pt has an appointment at [facility] MRI for today at 1:30 pm. Pt verbalized understanding of where to go. Pt is aware that Dr. Audie Pinto will call her later today with her results.

## 2014-12-08 NOTE — ED Notes (Signed)
Patient states she was seen here for approximately two weeks ago lower back and lower abdominal pain.  Was treated for BV and completed her antibiotic three or four days ago and had complete relief.  States the following day, she developed the same back and abdominal pain.  Denies any vaginal drainage at this time.

## 2014-12-08 NOTE — ED Notes (Signed)
I called pt at Dr. Antonietta Barcelona request and advised her to follow the discharge plan of obtaining an appointment to see Dr. Barbaraann Barthel. Pt verbalized understanding.

## 2014-12-08 NOTE — ED Provider Notes (Signed)
CSN: [id number]     Arrival date & time 12/08/14  1696 History   First MD Initiated Contact with Patient 12/08/14 0801     Chief Complaint  Patient presents with  . Back Pain      HPI Patient states she was seen here for approximately two weeks ago lower back and lower abdominal pain. Was treated for BV and completed her antibiotic three or four days ago and had complete relief. States the following day, she developed the same back and abdominal pain. Denies any vaginal drainage at this time. Past Medical History  Diagnosis Date  . Hypertension   . Asthma   . Stress    Past Surgical History  Procedure Laterality Date  . Tonsillectomy    . Myomectomy     Family History  Problem Relation Age of Onset  . Ataxia Neg Hx   . Chorea Neg Hx   . Dementia Neg Hx   . Mental retardation Neg Hx   . Migraines Neg Hx   . Multiple sclerosis Neg Hx   . Neurofibromatosis Neg Hx   . Neuropathy Neg Hx   . Parkinsonism Neg Hx   . Seizures Neg Hx   . Stroke Neg Hx    Social History  Substance Use Topics  . Smoking status: Current Some Day Smoker    Types: Cigarettes  . Smokeless tobacco: None  . Alcohol Use: No   OB History    No data available     Review of Systems  All other systems reviewed and are negative.     Allergies  Latex  Home Medications   Prior to Admission medications   Medication Sig Start Date End Date Taking? Authorizing Kathryne Ramella  ALBUTEROL IN Inhale into the lungs.   Yes Historical Eliav Mechling, MD  HYDROcodone-acetaminophen (NORCO/VICODIN) 5-325 MG per tablet Take 2 tablets by mouth every 4 (four) hours as needed. 12/08/14   Leonard Schwartz, MD  ibuprofen (ADVIL,MOTRIN) 600 MG tablet Take 1 tablet (600 mg total) by mouth every 6 (six) hours as needed. 12/08/14   Leonard Schwartz, MD  metroNIDAZOLE (FLAGYL) 500 MG tablet Take 1 tablet (500 mg total) by mouth 2 (two) times daily. 11/24/14   Serita Grit, MD   BP 139/94 mmHg  Pulse 72  Temp(Src) 98.4 F (36.9 C)  (Oral)  Resp 18  Ht 5\' 5"  (1.651 m)  Wt 190 lb (86.183 kg)  BMI 31.62 kg/m2  SpO2 100% Physical Exam  Constitutional: She is oriented to person, place, and time. She appears well-developed and well-nourished. No distress.  HENT:  Head: Normocephalic and atraumatic.  Eyes: Pupils are equal, round, and reactive to light.  Neck: Normal range of motion.  Cardiovascular: Normal rate and intact distal pulses.   Pulmonary/Chest: No respiratory distress.  Abdominal: Normal appearance. She exhibits no distension.  Musculoskeletal:       Lumbar back: She exhibits decreased range of motion.       Back:  Neurological: She is alert and oriented to person, place, and time. No cranial nerve deficit.  Skin: Skin is warm and dry. No rash noted.  Psychiatric: She has a normal mood and affect. Her behavior is normal.  Nursing note and vitals reviewed.   ED Course  Procedures (including critical care time) Labs Review Labs Reviewed  URINALYSIS, ROUTINE W REFLEX MICROSCOPIC (NOT AT [facility]) - Abnormal; Notable for the following:    APPearance CLOUDY (*)    Leukocytes, UA TRACE (*)    All other components  within normal limits  URINE MICROSCOPIC-ADD ON - Abnormal; Notable for the following:    Squamous Epithelial / LPF MANY (*)    Bacteria, UA MANY (*)    All other components within normal limits  PREGNANCY, URINE    Imaging Review No results found. I have personally reviewed and evaluated these images and lab results as part of my medical decision-making.  Patient is going to have an MRI of the lumbar spine's afternoon.  If negative then we'll refer to Dr. Barbaraann Barthel if positive will refer to neurosurgery  MDM   Final diagnoses:  Pain  Low back pain without sciatica, unspecified back pain laterality        Leonard Schwartz, MD 12/08/14 [phone]

## 2014-12-08 NOTE — Discharge Instructions (Signed)
Back Pain, Adult Low back pain is very common. About 1 in 5 people have back pain.The cause of low back pain is rarely dangerous. The pain often gets better over time.About half of people with a sudden onset of back pain feel better in just 2 weeks. About 8 in 10 people feel better by 6 weeks.  CAUSES Some common causes of back pain include:  Strain of the muscles or ligaments supporting the spine.  Wear and tear (degeneration) of the spinal discs.  Arthritis.  Direct injury to the back. DIAGNOSIS Most of the time, the direct cause of low back pain is not known.However, back pain can be treated effectively even when the exact cause of the pain is unknown.Answering your caregiver's questions about your overall health and symptoms is one of the most accurate ways to make sure the cause of your pain is not dangerous. If your caregiver needs more information, he or she may order lab work or imaging tests (X-rays or MRIs).However, even if imaging tests show changes in your back, this usually does not require surgery. HOME CARE INSTRUCTIONS For many people, back pain returns.Since low back pain is rarely dangerous, it is often a condition that people can learn to manageon their own.   Remain active. It is stressful on the back to sit or stand in one place. Do not sit, drive, or stand in one place for more than 30 minutes at a time. Take short walks on level surfaces as soon as pain allows.Try to increase the length of time you walk each day.  Do not stay in bed.Resting more than 1 or 2 days can delay your recovery.  Do not avoid exercise or work.Your body is made to move.It is not dangerous to be active, even though your back may hurt.Your back will likely heal faster if you return to being active before your pain is gone.  Pay attention to your body when you bend and lift. Many people have less discomfortwhen lifting if they bend their knees, keep the load close to their bodies,and  avoid twisting. Often, the most comfortable positions are those that put less stress on your recovering back.  Find a comfortable position to sleep. Use a firm mattress and lie on your side with your knees slightly bent. If you lie on your back, put a pillow under your knees.  Only take over-the-counter or prescription medicines as directed by your caregiver. Over-the-counter medicines to reduce pain and inflammation are often the most helpful.Your caregiver may prescribe muscle relaxant drugs.These medicines help dull your pain so you can more quickly return to your normal activities and healthy exercise.  Put ice on the injured area.  Put ice in a plastic bag.  Place a towel between your skin and the bag.  Leave the ice on for 15-20 minutes, 03-04 times a day for the first 2 to 3 days. After that, ice and heat may be alternated to reduce pain and spasms.  Ask your caregiver about trying back exercises and gentle massage. This may be of some benefit.  Avoid feeling anxious or stressed.Stress increases muscle tension and can worsen back pain.It is important to recognize when you are anxious or stressed and learn ways to manage it.Exercise is a great option. SEEK MEDICAL CARE IF:  You have pain that is not relieved with rest or medicine.  You have pain that does not improve in 1 week.  You have new symptoms.  You are generally not feeling well. SEEK   IMMEDIATE MEDICAL CARE IF:   You have pain that radiates from your back into your legs.  You develop new bowel or bladder control problems.  You have unusual weakness or numbness in your arms or legs.  You develop nausea or vomiting.  You develop abdominal pain.  You feel faint. Document Released: 03/03/2005 Document Revised: 09/02/2011 Document Reviewed: 07/05/2013 [employer] Patient Information 2015 [employer]. This information is not intended to replace advice given to you by your health care provider. Make sure you  discuss any questions you have with your health care provider.  

## 2015-03-29 ENCOUNTER — Encounter (HOSPITAL_BASED_OUTPATIENT_CLINIC_OR_DEPARTMENT_OTHER): Payer: Self-pay | Admitting: *Deleted

## 2015-03-29 ENCOUNTER — Emergency Department (HOSPITAL_BASED_OUTPATIENT_CLINIC_OR_DEPARTMENT_OTHER): Payer: Medicaid Other

## 2015-03-29 ENCOUNTER — Emergency Department (HOSPITAL_BASED_OUTPATIENT_CLINIC_OR_DEPARTMENT_OTHER)
Admission: EM | Admit: 2015-03-29 | Discharge: 2015-03-29 | Disposition: A | Discharge status: Home / Self Care | Payer: Medicaid Other | Attending: Emergency Medicine | Admitting: Emergency Medicine

## 2015-03-29 DIAGNOSIS — Z79899 Other long term (current) drug therapy: Secondary | ICD-10-CM | POA: Insufficient documentation

## 2015-03-29 DIAGNOSIS — Z792 Long term (current) use of antibiotics: Secondary | ICD-10-CM | POA: Diagnosis not present

## 2015-03-29 DIAGNOSIS — R509 Fever, unspecified: Secondary | ICD-10-CM | POA: Diagnosis present

## 2015-03-29 DIAGNOSIS — Z91040 Latex allergy status: Secondary | ICD-10-CM | POA: Diagnosis not present

## 2015-03-29 DIAGNOSIS — I10 Essential (primary) hypertension: Secondary | ICD-10-CM | POA: Insufficient documentation

## 2015-03-29 DIAGNOSIS — R0981 Nasal congestion: Secondary | ICD-10-CM | POA: Insufficient documentation

## 2015-03-29 DIAGNOSIS — F17210 Nicotine dependence, cigarettes, uncomplicated: Secondary | ICD-10-CM | POA: Diagnosis not present

## 2015-03-29 DIAGNOSIS — B349 Viral infection, unspecified: Secondary | ICD-10-CM | POA: Diagnosis not present

## 2015-03-29 DIAGNOSIS — J45909 Unspecified asthma, uncomplicated: Secondary | ICD-10-CM | POA: Insufficient documentation

## 2015-03-29 MED ORDER — IBUPROFEN 800 MG PO TABS: 800.0000 mg | ORAL_TABLET | Freq: Once | ORAL | Status: DC

## 2015-03-29 MED ORDER — IBUPROFEN 800 MG PO TABS: 800.0000 mg | ORAL_TABLET | Freq: Three times a day (TID) | ORAL | Status: DC | PRN

## 2015-03-29 MED ORDER — ACETAMINOPHEN 500 MG PO TABS
1000.0000 mg | ORAL_TABLET | Freq: Once | ORAL | Status: AC
Start: 2015-03-29 — End: 2015-03-29
  Administered 2015-03-29: 1000 mg via ORAL
  Filled 2015-03-29: qty 2

## 2015-03-29 MED ORDER — FLUTICASONE PROPIONATE 50 MCG/ACT NA SUSP: 2.0000 | Freq: Every day | NASAL | Status: DC

## 2015-03-29 MED ORDER — BENZONATATE 100 MG PO CAPS: 100.0000 mg | ORAL_CAPSULE | Freq: Three times a day (TID) | ORAL | Status: DC | PRN

## 2015-03-29 NOTE — ED Provider Notes (Signed)
CSN: [id number]     Arrival date & time 03/29/15  1235 History   First MD Initiated Contact with Patient 03/29/15 1246     Chief Complaint  Patient presents with  . Fever    HPI  Ms. Suzan Garibaldi is an 40 y.o. female with history of HTN who presents to the ED for evaluation of bodyaches, congestion, cough, and chills for the past week. She states that her symptoms began after she visited her parents one week ago. She states she has had progressively worsening diffuse body aches, particularly all over her back, hips, and legs. She states she is a [employer] and has to walk many miles on campus and her body aches have made it very difficult for her to do so. She also reports nasal congestion with night time rhinorrhea. Endorses intermittent nausea with one episode of emesis yesterday. She states she has also had an intermittent cough for the past week but it became significantly worse yesterday when she returned to campus for class. She states she has been taking mucinex with no relief. She is unsure if she has had a fever at home. Denies sick contacts. Denies abdominal pain, diarrhea, dysuria, urinary frequency/urgency. Denies SOB or wheezing.   Past Medical History  Diagnosis Date  . Hypertension   . Asthma   . Stress    Past Surgical History  Procedure Laterality Date  . Tonsillectomy    . Myomectomy     Family History  Problem Relation Age of Onset  . Ataxia Neg Hx   . Chorea Neg Hx   . Dementia Neg Hx   . Mental retardation Neg Hx   . Migraines Neg Hx   . Multiple sclerosis Neg Hx   . Neurofibromatosis Neg Hx   . Neuropathy Neg Hx   . Parkinsonism Neg Hx   . Seizures Neg Hx   . Stroke Neg Hx    Social History  Substance Use Topics  . Smoking status: Current Some Day Smoker    Types: Cigarettes  . Smokeless tobacco: None  . Alcohol Use: No   OB History    No data available     Review of Systems  All other systems reviewed and are negative.     Allergies  Latex  Home  Medications   Prior to Admission medications   Medication Sig Start Date End Date Taking? Authorizing Provider  ALBUTEROL IN Inhale into the lungs.    Historical Provider, MD  HYDROcodone-acetaminophen (NORCO/VICODIN) 5-325 MG per tablet Take 2 tablets by mouth every 4 (four) hours as needed. 12/08/14   Leonard Schwartz, MD  ibuprofen (ADVIL,MOTRIN) 600 MG tablet Take 1 tablet (600 mg total) by mouth every 6 (six) hours as needed. 12/08/14   Leonard Schwartz, MD  metroNIDAZOLE (FLAGYL) 500 MG tablet Take 1 tablet (500 mg total) by mouth 2 (two) times daily. 11/24/14   Serita Grit, MD   BP 123/68 mmHg  Pulse 94  Temp(Src) 98.3 F (36.8 C) (Oral)  Resp 20  Ht 5\' 6"  (1.676 m)  Wt 85.73 kg  BMI 30.52 kg/m2  SpO2 96% Physical Exam  Constitutional: She is oriented to person, place, and time. No distress.  HENT:  Head: Atraumatic.  Right Ear: External ear normal.  Left Ear: External ear normal.  Nose: Mucosal edema and rhinorrhea present. Right sinus exhibits no maxillary sinus tenderness and no frontal sinus tenderness. Left sinus exhibits no maxillary sinus tenderness and no frontal sinus tenderness.  Mouth/Throat: Oropharynx is clear and  moist and mucous membranes are normal. No trismus in the jaw. No oropharyngeal exudate, posterior oropharyngeal edema or posterior oropharyngeal erythema.  Eyes: Conjunctivae and EOM are normal. Right eye exhibits no discharge. Left eye exhibits no discharge.  Neck: Normal range of motion. Neck supple.  Cardiovascular: Normal rate, regular rhythm and normal heart sounds.   Pulmonary/Chest: Effort normal and breath sounds normal. No respiratory distress. She has no wheezes.  Abdominal: Soft. Bowel sounds are normal. She exhibits no distension. There is no tenderness.  Musculoskeletal: Normal range of motion. She exhibits no edema.  Back is diffusely ttp. FROM. UE and LE strength and sensation intact.  Lymphadenopathy:    She has no cervical adenopathy.   Neurological: She is alert and oriented to person, place, and time. No cranial nerve deficit. Coordination normal.  Skin: Skin is warm and dry. No rash noted. She is not diaphoretic.  Psychiatric: She has a normal mood and affect.   Filed Vitals:   03/29/15 1240 03/29/15 1414  BP: 123/68 125/86  Pulse: 94 80  Temp: 98.3 F (36.8 C)   TempSrc: Oral   Resp: 20 18  Height: 5\' 6"  (1.676 m)   Weight: 85.73 kg   SpO2: 96% 100%       ED Course  Procedures (including critical care time) Labs Review Labs Reviewed - No data to display  Imaging Review Dg Chest 2 View  03/29/2015  CLINICAL DATA:  Cough and congestion for 1 week EXAM: CHEST - 2 VIEW COMPARISON:  09/01/2014, 05/19/2013 FINDINGS: The heart size and mediastinal contours are within normal limits. Both lungs are clear. The visualized skeletal structures are unremarkable. IMPRESSION: No active disease. Electronically Signed   By: Inez Catalina M.D.   On: 03/29/2015 13:43   I have personally reviewed and evaluated these images and lab results as part of my medical decision-making.   EKG Interpretation None      MDM   Final diagnoses:  Viral syndrome  Nasal congestion    Pt is afebrile, not tachycardic in the ED today. I suspect viral etiology of pt's symptoms. However, given one week duration with progressive nature of symptoms I will obtain a chest x-ray to r/o pneumonia or other acute cardiopulmonary etiology. Will give motrin and tylenol here for chills and body aches. I anticipate d/c home with supportive meds including flonase, motrin, tessalon. Pt has mucinex at home.   CXR negative. Ibuprofen and acetaminophen given here. Rx given as above. Instructed to f/u with PCP. ER return precautions given.     Anne Ng, PA-C 03/29/15 1505  Tanna Furry, MD 04/05/15 1325

## 2015-03-29 NOTE — Discharge Instructions (Signed)
You were seen in the emergency room today for evaluation of body aches, congestion, cough, and chills. Your chest x-ray was completely normal. You do not have a fever. Your exam was unremarkable. Your symptoms are likely due to a virus causing you to have a bad cold. I will give you prescriptions to help with your symptoms. Drink plenty of fluids to stay hydrated. You may take the mucinex you have at home in addition to your prescriptions to help loosen the phlegm so it is easier to cough up. Please follow up with your primary care provider within one week. Return to the ER for new or worsening symptoms.   Viral Infections A viral infection can be caused by different types of viruses.Most viral infections are not serious and resolve on their own. However, some infections may cause severe symptoms and may lead to further complications. SYMPTOMS Viruses can frequently cause:  Minor sore throat.  Aches and pains.  Headaches.  Runny nose.  Different types of rashes.  Watery eyes.  Tiredness.  Cough.  Loss of appetite.  Gastrointestinal infections, resulting in nausea, vomiting, and diarrhea. These symptoms do not respond to antibiotics because the infection is not caused by bacteria. However, you might catch a bacterial infection following the viral infection. This is sometimes called a "superinfection." Symptoms of such a bacterial infection may include:  Worsening sore throat with pus and difficulty swallowing.  Swollen neck glands.  Chills and a high or persistent fever.  Severe headache.  Tenderness over the sinuses.  Persistent overall ill feeling (malaise), muscle aches, and tiredness (fatigue).  Persistent cough.  Yellow, green, or brown mucus production with coughing. HOME CARE INSTRUCTIONS   Only take over-the-counter or prescription medicines for pain, discomfort, diarrhea, or fever as directed by your caregiver.  Drink enough water and fluids to keep your urine  clear or pale yellow. Sports drinks can provide valuable electrolytes, sugars, and hydration.  Get plenty of rest and maintain proper nutrition. Soups and broths with crackers or rice are fine. SEEK IMMEDIATE MEDICAL CARE IF:   You have severe headaches, shortness of breath, chest pain, neck pain, or an unusual rash.  You have uncontrolled vomiting, diarrhea, or you are unable to keep down fluids.  You or your child has an oral temperature above 102 F (38.9 C), not controlled by medicine.  Your baby is older than 3 months with a rectal temperature of 102 F (38.9 C) or higher.  Your baby is 55 months old or younger with a rectal temperature of 100.4 F (38 C) or higher. MAKE SURE YOU:   Understand these instructions.  Will watch your condition.  Will get help right away if you are not doing well or get worse.   This information is not intended to replace advice given to you by your health care provider. Make sure you discuss any questions you have with your health care provider.   Document Released: 12/11/2004 Document Revised: 05/26/2011 Document Reviewed: 08/09/2014 Elsevier Interactive Patient Education [employer].

## 2015-03-29 NOTE — ED Notes (Signed)
Fever, bodyaches, cough and headache x 1 week.

## 2015-05-03 ENCOUNTER — Emergency Department (HOSPITAL_BASED_OUTPATIENT_CLINIC_OR_DEPARTMENT_OTHER)
Admission: EM | Admit: 2015-05-03 | Discharge: 2015-05-03 | Disposition: A | Discharge status: Home / Self Care | Payer: Medicaid Other | Attending: Emergency Medicine | Admitting: Emergency Medicine

## 2015-05-03 ENCOUNTER — Encounter (HOSPITAL_BASED_OUTPATIENT_CLINIC_OR_DEPARTMENT_OTHER): Payer: Self-pay | Admitting: *Deleted

## 2015-05-03 ENCOUNTER — Emergency Department (HOSPITAL_BASED_OUTPATIENT_CLINIC_OR_DEPARTMENT_OTHER): Payer: Medicaid Other

## 2015-05-03 DIAGNOSIS — F17210 Nicotine dependence, cigarettes, uncomplicated: Secondary | ICD-10-CM | POA: Diagnosis not present

## 2015-05-03 DIAGNOSIS — J45909 Unspecified asthma, uncomplicated: Secondary | ICD-10-CM | POA: Insufficient documentation

## 2015-05-03 DIAGNOSIS — R002 Palpitations: Secondary | ICD-10-CM

## 2015-05-03 DIAGNOSIS — Z7951 Long term (current) use of inhaled steroids: Secondary | ICD-10-CM | POA: Diagnosis not present

## 2015-05-03 DIAGNOSIS — Z79899 Other long term (current) drug therapy: Secondary | ICD-10-CM | POA: Insufficient documentation

## 2015-05-03 DIAGNOSIS — Z91040 Latex allergy status: Secondary | ICD-10-CM | POA: Insufficient documentation

## 2015-05-03 DIAGNOSIS — I10 Essential (primary) hypertension: Secondary | ICD-10-CM | POA: Diagnosis not present

## 2015-05-03 LAB — BASIC METABOLIC PANEL
ANION GAP: 10 (ref 5–15)
BUN: 16 mg/dL (ref 6–20)
CO2: 25 mmol/L (ref 22–32)
Calcium: 9.4 mg/dL (ref 8.9–10.3)
Chloride: 107 mmol/L (ref 101–111)
Creatinine, Ser: 0.98 mg/dL (ref 0.44–1.00)
Glucose, Bld: 108 mg/dL — ABNORMAL HIGH (ref 65–99)
POTASSIUM: 4.1 mmol/L (ref 3.5–5.1)
SODIUM: 142 mmol/L (ref 135–145)

## 2015-05-03 LAB — CBC WITH DIFFERENTIAL/PLATELET
BASOS ABS: 0 10*3/uL (ref 0.0–0.1)
Basophils Relative: 0 %
EOS PCT: 1 %
Eosinophils Absolute: 0.1 10*3/uL (ref 0.0–0.7)
HEMATOCRIT: 39.6 % (ref 36.0–46.0)
Hemoglobin: 12.6 g/dL (ref 12.0–15.0)
LYMPHS PCT: 30 %
Lymphs Abs: 2.3 10*3/uL (ref 0.7–4.0)
MCH: 27 pg (ref 26.0–34.0)
MCHC: 31.8 g/dL (ref 30.0–36.0)
MCV: 85 fL (ref 78.0–100.0)
Monocytes Absolute: 0.3 10*3/uL (ref 0.1–1.0)
Monocytes Relative: 4 %
NEUTROS ABS: 4.9 10*3/uL (ref 1.7–7.7)
NEUTROS PCT: 65 %
PLATELETS: 245 10*3/uL (ref 150–400)
RBC: 4.66 MIL/uL (ref 3.87–5.11)
RDW: 14.1 % (ref 11.5–15.5)
WBC: 7.7 10*3/uL (ref 4.0–10.5)

## 2015-05-03 LAB — TROPONIN I: Troponin I: <0.03 ng/mL (ref <0.031)

## 2015-05-03 LAB — TSH: TSH: 1.016 u[IU]/mL (ref 0.350–4.500)

## 2015-05-03 NOTE — ED Notes (Signed)
Patient transported to radiology department via stretcher.

## 2015-05-03 NOTE — ED Notes (Signed)
MD at bedside. 

## 2015-05-03 NOTE — ED Provider Notes (Signed)
CSN: [id number]     Arrival date & time 05/03/15  0810 History   First MD Initiated Contact with Patient 05/03/15 0830     Chief Complaint  Patient presents with  . Palpitations     (Consider location/radiation/quality/duration/timing/severity/associated sxs/prior Treatment) HPI Comments: Patient is a 40 year old female with no significant past medical history. She presents for evaluation of palpitations that have been occurring for the past 2 days. She states that she occasionally feels a "forceful beat" that is not painful, however is somewhat annoying to her. She also states that she sees her "chest move" when she has these abnormal beats. She denies any chest pain or difficulty breathing. She denies any fevers or chills. She denies any recent exertional symptoms and has no prior cardiac history. She does report that she had an upper respiratory illness approximately 3 weeks ago which has since resolved.  Patient is a 40 y.o. female presenting with palpitations. The history is provided by the patient.  Palpitations Palpitations quality:  Regular Duration:  2 days Timing:  Intermittent Progression:  Worsening Chronicity:  New Relieved by:  Nothing Worsened by:  Nothing Ineffective treatments:  None tried Associated symptoms: no chest pain, no chest pressure and no shortness of breath     Past Medical History  Diagnosis Date  . Hypertension   . Asthma   . Stress    Past Surgical History  Procedure Laterality Date  . Tonsillectomy    . Myomectomy     Family History  Problem Relation Age of Onset  . Ataxia Neg Hx   . Chorea Neg Hx   . Dementia Neg Hx   . Mental retardation Neg Hx   . Migraines Neg Hx   . Multiple sclerosis Neg Hx   . Neurofibromatosis Neg Hx   . Neuropathy Neg Hx   . Parkinsonism Neg Hx   . Seizures Neg Hx   . Stroke Neg Hx    Social History  Substance Use Topics  . Smoking status: Current Some Day Smoker    Types: Cigarettes  . Smokeless tobacco:  None  . Alcohol Use: No   OB History    No data available     Review of Systems  Respiratory: Negative for shortness of breath.   Cardiovascular: Positive for palpitations. Negative for chest pain.  All other systems reviewed and are negative.     Allergies  Latex  Home Medications   Prior to Admission medications   Medication Sig Start Date End Date Taking? Authorizing Provider  ALBUTEROL IN Inhale into the lungs.    Historical Provider, MD  fluticasone (FLONASE) 50 MCG/ACT nasal spray Place 2 sprays into both nostrils daily. 03/29/15   Anne Ng, PA-C  HYDROcodone-acetaminophen (NORCO/VICODIN) 5-325 MG per tablet Take 2 tablets by mouth every 4 (four) hours as needed. 12/08/14   Leonard Schwartz, MD  ibuprofen (ADVIL,MOTRIN) 600 MG tablet Take 1 tablet (600 mg total) by mouth every 6 (six) hours as needed. 12/08/14   Leonard Schwartz, MD  ibuprofen (ADVIL,MOTRIN) 800 MG tablet Take 1 tablet (800 mg total) by mouth every 8 (eight) hours as needed (chills, body aches). 03/29/15   Olivia Canter Sam, PA-C   BP 131/67 mmHg  Pulse 54  Temp(Src) 98.1 F (36.7 C) (Oral)  Resp 16  SpO2 99% Physical Exam  Constitutional: She is oriented to person, place, and time. She appears well-developed and well-nourished. No distress.  HENT:  Head: Normocephalic and atraumatic.  Neck: Normal range of motion.  Neck supple.  Cardiovascular: Normal rate and regular rhythm.  Exam reveals no gallop and no friction rub.   No murmur heard. Pulmonary/Chest: Effort normal and breath sounds normal. No respiratory distress. She has no wheezes.  Abdominal: Soft. Bowel sounds are normal. She exhibits no distension. There is no tenderness.  Musculoskeletal: Normal range of motion.  Neurological: She is alert and oriented to person, place, and time.  Skin: Skin is warm and dry. She is not diaphoretic.  Nursing note and vitals reviewed.   ED Course  Procedures (including critical care time) Labs Review Labs  Reviewed - No data to display  Imaging Review No results found. I have personally reviewed and evaluated these images and lab results as part of my medical decision-making.   EKG Interpretation   Date/Time:  Thursday May 03 2015 08:19:38 EST Ventricular Rate:  57 PR Interval:  135 QRS Duration: 95 QT Interval:  419 QTC Calculation: 408 R Axis:   43 Text Interpretation:  Sinus rhythm Low voltage, precordial leads Confirmed  by Hevin Jeffcoat  MD, Sanah Kraska ([id number]) on 05/03/2015 8:31:51 AM      MDM   Final diagnoses:  None    Workup reveals unremarkable electrolytes and normal EKG. She is observed on the monitor for a period of approximately 2 hours and had no episodes of ectopy. She was advised to watch her caffeine intake and she is to follow-up with cardiology if her palpitations persist to discuss a possible event monitor.    Veryl Speak, MD 05/03/15 1027

## 2015-05-03 NOTE — ED Notes (Signed)
Pt reports "compulsions" in her chest "like my heart is beating real hard". Pt states she had a "bad cold" a few weeks ago and she wonders if this is related. Pt denies any of these feelings right now, denies any c/o at this time.  Last happened yesterday.

## 2015-05-03 NOTE — Discharge Instructions (Signed)
Avoid caffeine intake for the next several days.  Follow-up with cardiology if not improving in the next several days, and return to the ER if symptoms significantly worsen.   Palpitations A palpitation is the feeling that your heartbeat is irregular or is faster than normal. It may feel like your heart is fluttering or skipping a beat. Palpitations are usually not a serious problem. However, in some cases, you may need further medical evaluation. CAUSES  Palpitations can be caused by:  Smoking.  Caffeine or other stimulants, such as diet pills or energy drinks.  Alcohol.  Stress and anxiety.  Strenuous physical activity.  Fatigue.  Certain medicines.  Heart disease, especially if you have a history of irregular heart rhythms (arrhythmias), such as atrial fibrillation, atrial flutter, or supraventricular tachycardia.  An improperly working pacemaker or defibrillator. DIAGNOSIS  To find the cause of your palpitations, your health care provider will take your medical history and perform a physical exam. Your health care provider may also have you take a test called an ambulatory electrocardiogram (ECG). An ECG records your heartbeat patterns over a 24-hour period. You may also have other tests, such as:  Transthoracic echocardiogram (TTE). During echocardiography, sound waves are used to evaluate how blood flows through your heart.  Transesophageal echocardiogram (TEE).  Cardiac monitoring. This allows your health care provider to monitor your heart rate and rhythm in real time.  Holter monitor. This is a portable device that records your heartbeat and can help diagnose heart arrhythmias. It allows your health care provider to track your heart activity for several days, if needed.  Stress tests by exercise or by giving medicine that makes the heart beat faster. TREATMENT  Treatment of palpitations depends on the cause of your symptoms and can vary greatly. Most cases of  palpitations do not require any treatment other than time, relaxation, and monitoring your symptoms. Other causes, such as atrial fibrillation, atrial flutter, or supraventricular tachycardia, usually require further treatment. HOME CARE INSTRUCTIONS   Avoid:  Caffeinated coffee, tea, soft drinks, diet pills, and energy drinks.  Chocolate.  Alcohol.  Stop smoking if you smoke.  Reduce your stress and anxiety. Things that can help you relax include:  A method of controlling things in your body, such as your heartbeats, with your mind (biofeedback).  Yoga.  Meditation.  Physical activity such as swimming, jogging, or walking.  Get plenty of rest and sleep. SEEK MEDICAL CARE IF:   You continue to have a fast or irregular heartbeat beyond 24 hours.  Your palpitations occur more often. SEEK IMMEDIATE MEDICAL CARE IF:  You have chest pain or shortness of breath.  You have a severe headache.  You feel dizzy or you faint. MAKE SURE YOU:  Understand these instructions.  Will watch your condition.  Will get help right away if you are not doing well or get worse.   This information is not intended to replace advice given to you by your health care provider. Make sure you discuss any questions you have with your health care provider.   Document Released: 02/29/2000 Document Revised: 03/08/2013 Document Reviewed: 05/02/2011 Elsevier Interactive Patient Education [employer].

## 2015-06-11 DIAGNOSIS — M5416 Radiculopathy, lumbar region: Secondary | ICD-10-CM | POA: Insufficient documentation

## 2015-09-27 ENCOUNTER — Encounter (HOSPITAL_BASED_OUTPATIENT_CLINIC_OR_DEPARTMENT_OTHER): Payer: Self-pay | Admitting: *Deleted

## 2015-09-27 ENCOUNTER — Emergency Department (HOSPITAL_BASED_OUTPATIENT_CLINIC_OR_DEPARTMENT_OTHER)
Admission: EM | Admit: 2015-09-27 | Discharge: 2015-09-27 | Disposition: A | Discharge status: Home / Self Care | Payer: Medicaid Other | Attending: Physician Assistant | Admitting: Physician Assistant

## 2015-09-27 DIAGNOSIS — I10 Essential (primary) hypertension: Secondary | ICD-10-CM | POA: Diagnosis not present

## 2015-09-27 DIAGNOSIS — G8929 Other chronic pain: Secondary | ICD-10-CM | POA: Insufficient documentation

## 2015-09-27 DIAGNOSIS — Z79899 Other long term (current) drug therapy: Secondary | ICD-10-CM | POA: Insufficient documentation

## 2015-09-27 DIAGNOSIS — M545 Low back pain: Secondary | ICD-10-CM | POA: Diagnosis present

## 2015-09-27 DIAGNOSIS — M549 Dorsalgia, unspecified: Secondary | ICD-10-CM

## 2015-09-27 DIAGNOSIS — J45909 Unspecified asthma, uncomplicated: Secondary | ICD-10-CM | POA: Insufficient documentation

## 2015-09-27 DIAGNOSIS — F17210 Nicotine dependence, cigarettes, uncomplicated: Secondary | ICD-10-CM | POA: Insufficient documentation

## 2015-09-27 MED ORDER — IBUPROFEN 800 MG PO TABS: 800.0000 mg | ORAL_TABLET | Freq: Three times a day (TID) | ORAL | Status: DC

## 2015-09-27 MED ORDER — OXYCODONE-ACETAMINOPHEN 5-325 MG PO TABS
1.0000 | ORAL_TABLET | Freq: Four times a day (QID) | ORAL | Status: DC | PRN
Start: 2015-09-27 — End: 2015-12-13

## 2015-09-27 MED ORDER — IBUPROFEN 400 MG PO TABS
600.0000 mg | ORAL_TABLET | Freq: Once | ORAL | Status: AC
  Administered 2015-09-27: 600 mg via ORAL
  Filled 2015-09-27: qty 1

## 2015-09-27 MED ORDER — METHOCARBAMOL 500 MG PO TABS: 500.0000 mg | ORAL_TABLET | Freq: Four times a day (QID) | ORAL | Status: DC | PRN
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
## 2015-09-27 NOTE — Discharge Instructions (Signed)
Back Exercises The following exercises strengthen the muscles that help to support the back. They also help to keep the lower back flexible. Doing these exercises can help to prevent back pain or lessen existing pain. If you have back pain or discomfort, try doing these exercises 2-3 times each day or as told by your health care provider. When the pain goes away, do them once each day, but increase the number of times that you repeat the steps for each exercise (do more repetitions). If you do not have back pain or discomfort, do these exercises once each day or as told by your health care provider. EXERCISES Single Knee to Chest Repeat these steps 3-5 times for each leg:  Lie on your back on a firm bed or the floor with your legs extended.  Bring one knee to your chest. Your other leg should stay extended and in contact with the floor.  Hold your knee in place by grabbing your knee or thigh.  Pull on your knee until you feel a gentle stretch in your lower back.  Hold the stretch for 10-30 seconds.  Slowly release and straighten your leg. Pelvic Tilt Repeat these steps 5-10 times:  Lie on your back on a firm bed or the floor with your legs extended.  Bend your knees so they are pointing toward the ceiling and your feet are flat on the floor.  Tighten your lower abdominal muscles to press your lower back against the floor. This motion will tilt your pelvis so your tailbone points up toward the ceiling instead of pointing to your feet or the floor.  With gentle tension and even breathing, hold this position for 5-10 seconds. Cat-Cow Repeat these steps until your lower back becomes more flexible:  Get into a hands-and-knees position on a firm surface. Keep your hands under your shoulders, and keep your knees under your hips. You may place padding under your knees for comfort.  Let your head hang down, and point your tailbone toward the floor so your lower back becomes rounded like the  back of a cat.  Hold this position for 5 seconds.  Slowly lift your head and point your tailbone up toward the ceiling so your back forms a sagging arch like the back of a cow.  Hold this position for 5 seconds. Press-Ups Repeat these steps 5-10 times:  Lie on your abdomen (face-down) on the floor.  Place your palms near your head, about shoulder-width apart.  While you keep your back as relaxed as possible and keep your hips on the floor, slowly straighten your arms to raise the top half of your body and lift your shoulders. Do not use your back muscles to raise your upper torso. You may adjust the placement of your hands to make yourself more comfortable.  Hold this position for 5 seconds while you keep your back relaxed.  Slowly return to lying flat on the floor. Bridges Repeat these steps 10 times:  Lie on your back on a firm surface.  Bend your knees so they are pointing toward the ceiling and your feet are flat on the floor.  Tighten your buttocks muscles and lift your buttocks off of the floor until your waist is at almost the same height as your knees. You should feel the muscles working in your buttocks and the back of your thighs. If you do not feel these muscles, slide your feet 1-2 inches farther away from your buttocks.  Hold this position for 3-5  seconds.  Slowly lower your hips to the starting position, and allow your buttocks muscles to relax completely. If this exercise is too easy, try doing it with your arms crossed over your chest. Abdominal Crunches Repeat these steps 5-10 times:  Lie on your back on a firm bed or the floor with your legs extended.  Bend your knees so they are pointing toward the ceiling and your feet are flat on the floor.  Cross your arms over your chest.  Tip your chin slightly toward your chest without bending your neck.  Tighten your abdominal muscles and slowly raise your trunk (torso) high enough to lift your shoulder blades a  tiny bit off of the floor. Avoid raising your torso higher than that, because it can put too much stress on your low back and it does not help to strengthen your abdominal muscles.  Slowly return to your starting position. Back Lifts Repeat these steps 5-10 times: 1. Lie on your abdomen (face-down) with your arms at your sides, and rest your forehead on the floor. 2. Tighten the muscles in your legs and your buttocks. 3. Slowly lift your chest off of the floor while you keep your hips pressed to the floor. Keep the back of your head in line with the curve in your back. Your eyes should be looking at the floor. 4. Hold this position for 3-5 seconds. 5. Slowly return to your starting position. SEEK MEDICAL CARE IF:  Your back pain or discomfort gets much worse when you do an exercise.  Your back pain or discomfort does not lessen within 2 hours after you exercise. If you have any of these problems, stop doing these exercises right away. Do not do them again unless your health care provider says that you can. SEEK IMMEDIATE MEDICAL CARE IF:  You develop sudden, severe back pain. If this happens, stop doing the exercises right away. Do not do them again unless your health care provider says that you can.   This information is not intended to replace advice given to you by your health care provider. Make sure you discuss any questions you have with your health care provider.   Document Released: 04/10/2004 Document Revised: 11/22/2014 Document Reviewed: 04/27/2014 Elsevier Interactive Patient Education 2016 [employer] Back Injury Prevention Back injuries can be very painful. They can also be difficult to heal. After having one back injury, you are more likely to injure your back again. It is important to learn how to avoid injuring or re-injuring your back. The following tips can help you to prevent a back injury. WHAT SHOULD I KNOW ABOUT PHYSICAL FITNESS?  Exercise for 30 minutes per day  on most days of the week or as directed by your health care provider. Make sure to:  Do aerobic exercises, such as walking, jogging, biking, or swimming.  Do exercises that increase balance and strength, such as tai chi and yoga. These can decrease your risk of falling and injuring your back.  Do stretching exercises to help with flexibility.  Try to develop strong abdominal muscles. Your abdominal muscles provide a lot of the support that is needed by your back.  Maintain a healthy weight. This helps to decrease your risk of a back injury. WHAT SHOULD I KNOW ABOUT MY DIET?  Talk with your health care provider about your overall diet. Take supplements and vitamins only as directed by your health care provider.  Talk with your health care provider about how much calcium and vitamin   vitamin D you need each day. These nutrients help to prevent weakening of the bones (osteoporosis). Osteoporosis can cause broken (fractured) bones, which lead to back pain.  Include good sources of calcium in your diet, such as dairy products, green leafy vegetables, and products that have had calcium added to them (fortified).  Include good sources of vitamin D in your diet, such as milk and foods that are fortified with vitamin D. WHAT SHOULD I KNOW ABOUT MY POSTURE?  Sit up straight and stand up straight. Avoid leaning forward when you sit or hunching over when you stand.  Choose chairs that have good low-back (lumbar) support.  If you work at a desk, sit close to it so you do not need to lean over. Keep your chin tucked in. Keep your neck drawn back, and keep your elbows bent at a right angle. Your arms should look like the letter "L."  Sit high and close to the steering wheel when you drive. Add a lumbar support to your car seat, if needed.  Avoid sitting or standing in one position for very long. Take breaks to get up, stretch, and walk around at least one time every hour. Take breaks every hour if you are  driving for long periods of time.  Sleep on your side with your knees slightly bent, or sleep on your back with a pillow under your knees. Do not lie on the front of your body to sleep. WHAT SHOULD I KNOW ABOUT LIFTING, TWISTING, AND REACHING? Lifting and Heavy Lifting  Avoid heavy lifting, especially repetitive heavy lifting. If you must do heavy lifting:  Stretch before lifting.  Work slowly.  Rest between lifts.  Use a tool such as a cart or a dolly to move objects if one is available.  Make several small trips instead of carrying one heavy load.  Ask for help when you need it, especially when moving big objects.  Follow these steps when lifting:  Stand with your feet shoulder-width apart.  Get as close to the object as you can. Do not try to pick up a heavy object that is far from your body.  Use handles or lifting straps if they are available.  Bend at your knees. Squat down, but keep your heels off the floor.  Keep your shoulders pulled back, your chin tucked in, and your back straight.  Lift the object slowly while you tighten the muscles in your legs, abdomen, and buttocks. Keep the object as close to the center of your body as possible.  Follow these steps when putting down a heavy load:  Stand with your feet shoulder-width apart.  Lower the object slowly while you tighten the muscles in your legs, abdomen, and buttocks. Keep the object as close to the center of your body as possible.  Keep your shoulders pulled back, your chin tucked in, and your back straight.  Bend at your knees. Squat down, but keep your heels off the floor.  Use handles or lifting straps if they are available. Twisting and Reaching  Avoid lifting heavy objects above your waist.  Do not twist at your waist while you are lifting or carrying a load. If you need to turn, move your feet.  Do not bend over without bending at your knees.  Avoid reaching over your head, across a table, or  for an object on a high surface. WHAT ARE SOME OTHER TIPS?  Avoid wet floors and icy ground. Keep sidewalks clear of ice to prevent  falls.  Do not sleep on a mattress that is too soft or too hard.  Keep items that are used frequently within easy reach.  Put heavier objects on shelves at waist level, and put lighter objects on lower or higher shelves.  Find ways to decrease your stress, such as exercise, massage, or relaxation techniques. Stress can build up in your muscles. Tense muscles are more vulnerable to injury.  Talk with your health care provider if you feel anxious or depressed. These conditions can make back pain worse.  Wear flat heel shoes with cushioned soles.  Avoid sudden movements.  Use both shoulder straps when carrying a backpack.  Do not use any tobacco products, including cigarettes, chewing tobacco, or electronic cigarettes. If you need help quitting, ask your health care provider.   This information is not intended to replace advice given to you by your health care provider. Make sure you discuss any questions you have with your health care provider.   Document Released: 04/10/2004 Document Revised: 07/18/2014 Document Reviewed: 03/07/2014 Elsevier Interactive Patient Education [employer].
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
## 2015-12-13 ENCOUNTER — Encounter (HOSPITAL_BASED_OUTPATIENT_CLINIC_OR_DEPARTMENT_OTHER): Payer: Self-pay | Admitting: *Deleted

## 2015-12-13 ENCOUNTER — Emergency Department (HOSPITAL_BASED_OUTPATIENT_CLINIC_OR_DEPARTMENT_OTHER)
Admission: EM | Admit: 2015-12-13 | Discharge: 2015-12-13 | Disposition: A | Discharge status: Home / Self Care | Payer: Medicaid Other | Attending: Emergency Medicine | Admitting: Emergency Medicine

## 2015-12-13 DIAGNOSIS — J45909 Unspecified asthma, uncomplicated: Secondary | ICD-10-CM | POA: Diagnosis not present

## 2015-12-13 DIAGNOSIS — Z91040 Latex allergy status: Secondary | ICD-10-CM | POA: Insufficient documentation

## 2015-12-13 DIAGNOSIS — F17210 Nicotine dependence, cigarettes, uncomplicated: Secondary | ICD-10-CM | POA: Insufficient documentation

## 2015-12-13 DIAGNOSIS — R197 Diarrhea, unspecified: Secondary | ICD-10-CM | POA: Insufficient documentation

## 2015-12-13 DIAGNOSIS — R1030 Lower abdominal pain, unspecified: Secondary | ICD-10-CM | POA: Diagnosis present

## 2015-12-13 DIAGNOSIS — I10 Essential (primary) hypertension: Secondary | ICD-10-CM | POA: Diagnosis not present

## 2015-12-13 DIAGNOSIS — R109 Unspecified abdominal pain: Secondary | ICD-10-CM | POA: Insufficient documentation

## 2015-12-13 LAB — URINALYSIS, ROUTINE W REFLEX MICROSCOPIC
Bilirubin Urine: NEGATIVE
GLUCOSE, UA: NEGATIVE mg/dL
HGB URINE DIPSTICK: NEGATIVE
Ketones, ur: NEGATIVE mg/dL
Leukocytes, UA: NEGATIVE
Nitrite: NEGATIVE
Protein, ur: NEGATIVE mg/dL
SPECIFIC GRAVITY, URINE: 1.018 (ref 1.005–1.030)
pH: 5 (ref 5.0–8.0)

## 2015-12-13 LAB — PREGNANCY, URINE: PREG TEST UR: NEGATIVE

## 2015-12-13 MED ORDER — IBUPROFEN 800 MG PO TABS: 800.0000 mg | ORAL_TABLET | Freq: Once | ORAL | Status: DC

## 2015-12-13 MED ORDER — KETOROLAC TROMETHAMINE 60 MG/2ML IM SOLN
60.0000 mg | Freq: Once | INTRAMUSCULAR | Status: AC
Start: 2015-12-13 — End: 2015-12-13
  Administered 2015-12-13: 60 mg via INTRAMUSCULAR
  Filled 2015-12-13: qty 2

## 2015-12-13 MED ORDER — NAPROXEN 500 MG PO TABS: 500.0000 mg | ORAL_TABLET | Freq: Two times a day (BID) | ORAL | 0 refills | Status: DC

## 2015-12-13 NOTE — ED Provider Notes (Signed)
[facility] DEPT Provider Note   CSN: [id number] Arrival date & time: 12/13/15  [id number]     History   Chief Complaint Chief Complaint  Patient presents with  . Abdominal Pain    HPI Shirley [name] is a 40 y.o. female.  HPI   Shirley [name] is a 40 y.o. female, patient with no pertinent past medical history, presenting to the ED with lower abdominal cramping intermittently for the last three days. Pt states that she hasn't had a menstrual cycle for the last 6 years following an ablation, however, she still gets monthly cramps like she did when she was having periods. These current cramps are consistent with her monthly cramps, but they are lasting longer. Has not tried any medications for her symptoms. Denies N/V/C/D, fevers/chills, urinary complaints, vaginal discharge, or any other complaints.      Past Medical History:  Diagnosis Date  . Asthma   . Hypertension   . Stress     Patient Active Problem List   Diagnosis Date Noted  . Headache(784.0) 06/02/2013  . Unspecified essential hypertension 06/02/2013    Past Surgical History:  Procedure Laterality Date  . MYOMECTOMY    . TONSILLECTOMY      OB History    No data available       Home Medications    Prior to Admission medications   Medication Sig Start Date End Date Taking? Authorizing Provider  ALBUTEROL IN Inhale into the lungs.    Historical Provider, MD  fluticasone (FLONASE) 50 MCG/ACT nasal spray Place 2 sprays into both nostrils daily. 03/29/15   Olivia Canter Sam, PA-C  naproxen (NAPROSYN) 500 MG tablet Take 1 tablet (500 mg total) by mouth 2 (two) times daily. 12/13/15   Lorayne Bender, PA-C    Family History Family History  Problem Relation Age of Onset  . Ataxia Neg Hx   . Chorea Neg Hx   . Dementia Neg Hx   . Mental retardation Neg Hx   . Migraines Neg Hx   . Multiple sclerosis Neg Hx   . Neurofibromatosis Neg Hx   . Neuropathy Neg Hx   . Parkinsonism Neg Hx   . Seizures Neg Hx   . Stroke  Neg Hx     Social History Social History  Substance Use Topics  . Smoking status: Current Some Day Smoker    Types: Cigarettes  . Smokeless tobacco: Never Used  . Alcohol use No     Allergies   Latex   Review of Systems Review of Systems  Constitutional: Negative for chills and fever.  Gastrointestinal: Positive for abdominal pain and diarrhea. Negative for blood in stool, constipation, nausea and vomiting.  Genitourinary: Negative for dysuria and hematuria.  All other systems reviewed and are negative.    Physical Exam Updated Vital Signs BP 130/63 (BP Location: Right Arm)   Pulse 72   Temp 97.5 F (36.4 C) (Oral)   Ht 5\' 6"  (1.676 m)   Wt 89.8 kg   SpO2 100%   BMI 31.96 kg/m   Physical Exam  Constitutional: She appears well-developed and well-nourished. No distress.  HENT:  Head: Normocephalic and atraumatic.  Eyes: Conjunctivae are normal.  Neck: Neck supple.  Cardiovascular: Normal rate, regular rhythm, normal heart sounds and intact distal pulses.   Pulmonary/Chest: Effort normal and breath sounds normal. No respiratory distress.  Abdominal: Soft. There is no tenderness. There is no guarding.  Musculoskeletal: She exhibits no edema.  Lymphadenopathy:    She  has no cervical adenopathy.  Neurological: She is alert.  Skin: Skin is warm and dry. She is not diaphoretic.  Psychiatric: She has a normal mood and affect. Her behavior is normal.  Nursing note and vitals reviewed.    ED Treatments / Results  Labs (all labs ordered are listed, but only abnormal results are displayed) Labs Reviewed  URINALYSIS, ROUTINE W REFLEX MICROSCOPIC (NOT AT [facility])  PREGNANCY, URINE    EKG  EKG Interpretation None       Radiology No results found.  Procedures Procedures (including critical care time)  Medications Ordered in ED Medications  ketorolac (TORADOL) injection 60 mg (60 mg Intramuscular Given 12/13/15 0928)     Initial Impression / Assessment  and Plan / ED Course  I have reviewed the triage vital signs and the nursing notes.  Pertinent labs & imaging results that were available during my care of the patient were reviewed by me and considered in my medical decision making (see chart for details).  Clinical Course    Patient presents with intermittent abdominal cramping consistent with her previous menstrual cramps. Patient is nontoxic appearing, afebrile, not tachycardic, not tachypneic, not hypotensive, maintains SPO2 of 100% on room air, and is in no apparent distress. Patient has no signs of sepsis or other serious or life-threatening condition. Benign abdominal exam. OB/GYN follow-up. Return precautions discussed.  Vitals:   12/13/15 0830 12/13/15 1103  BP: 130/63 133/89  Pulse: 72 (!) 51  Resp:  18  Temp: 97.5 F (36.4 C)   TempSrc: Oral   SpO2: 100% 100%  Weight: 89.8 kg   Height: 5\' 6"  (1.676 m)      Final Clinical Impressions(s) / ED Diagnoses   Final diagnoses:  Abdominal cramping    New Prescriptions Discharge Medication List as of 12/13/2015  9:59 AM    START taking these medications   Details  naproxen (NAPROSYN) 500 MG tablet Take 1 tablet (500 mg total) by mouth 2 (two) times daily., Starting Thu 12/13/2015, Print         Lorayne Bender, PA-C 12/14/15 [place], MD 12/15/15 [phone]

## 2015-12-13 NOTE — Discharge Instructions (Signed)
You have been seen today for abdominal cramping. Your lab tests showed no abnormalities. Follow up with OBGYN as soon as possible. Follow up with PCP as needed. Return to ED should symptoms worsen. Ibuprofen or naproxen for future cramping.

## 2015-12-13 NOTE — ED Triage Notes (Signed)
Pt reports abd pain "cramping" 2 days ago, resolved, then returned yesterday. Denies any n/v/d/fevers, last bm this am.

## 2016-01-23 ENCOUNTER — Emergency Department (HOSPITAL_BASED_OUTPATIENT_CLINIC_OR_DEPARTMENT_OTHER)
Admission: EM | Admit: 2016-01-23 | Discharge: 2016-01-23 | Disposition: A | Discharge status: Home / Self Care | Payer: Medicaid Other | Attending: Emergency Medicine | Admitting: Emergency Medicine

## 2016-01-23 ENCOUNTER — Encounter (HOSPITAL_BASED_OUTPATIENT_CLINIC_OR_DEPARTMENT_OTHER): Payer: Self-pay | Admitting: Emergency Medicine

## 2016-01-23 DIAGNOSIS — F17210 Nicotine dependence, cigarettes, uncomplicated: Secondary | ICD-10-CM | POA: Diagnosis not present

## 2016-01-23 DIAGNOSIS — I10 Essential (primary) hypertension: Secondary | ICD-10-CM | POA: Insufficient documentation

## 2016-01-23 DIAGNOSIS — J029 Acute pharyngitis, unspecified: Secondary | ICD-10-CM | POA: Diagnosis not present

## 2016-01-23 DIAGNOSIS — Z79899 Other long term (current) drug therapy: Secondary | ICD-10-CM | POA: Diagnosis not present

## 2016-01-23 DIAGNOSIS — J45909 Unspecified asthma, uncomplicated: Secondary | ICD-10-CM | POA: Insufficient documentation

## 2016-01-23 LAB — URINALYSIS, ROUTINE W REFLEX MICROSCOPIC
BILIRUBIN URINE: NEGATIVE
Glucose, UA: NEGATIVE mg/dL
HGB URINE DIPSTICK: NEGATIVE
KETONES UR: NEGATIVE mg/dL
Leukocytes, UA: NEGATIVE
NITRITE: NEGATIVE
PH: 7 (ref 5.0–8.0)
Protein, ur: NEGATIVE mg/dL
Specific Gravity, Urine: 1.019 (ref 1.005–1.030)

## 2016-01-23 LAB — PREGNANCY, URINE: Preg Test, Ur: NEGATIVE

## 2016-01-23 LAB — RAPID STREP SCREEN (MED CTR MEBANE ONLY): Streptococcus, Group A Screen (Direct): NEGATIVE

## 2016-01-23 MED ORDER — IBUPROFEN 800 MG PO TABS: 800.0000 mg | ORAL_TABLET | Freq: Three times a day (TID) | ORAL | 0 refills | Status: DC

## 2016-01-23 MED ORDER — CHLORHEXIDINE GLUCONATE 0.12 % MT SOLN: 15.0000 mL | Freq: Two times a day (BID) | OROMUCOSAL | 0 refills | Status: DC

## 2016-01-23 NOTE — ED Triage Notes (Signed)
Pt states she has sore throat x 2 days.  Pt also has side pain for two months.  No fever.  Son recently had strep throat.

## 2016-01-23 NOTE — ED Provider Notes (Signed)
[facility] DEPT [facility] Provider Note   CSN: [id number] Arrival date & time: 01/23/16  1023     History   Chief Complaint Chief Complaint  Patient presents with  . Sore Throat  . Flank Pain    HPI Shirley [name] is a 40 y.o. female.  HPI   40 year old female presenting with recurrence of sore throat. Patient states she has throat irritation that started yesterday and intensify today. Report nasal congestion, hoarseness in her voice, and pain with swallowing. No associated fever, chills, headache, productive cough, ear pain, neck pain, chest pain, shortness of breath, abdominal pain. No specific treatment tried Her son was recently diagnosed with strep throat last week. She also report having pain to her right side" for the past 2 months but her primary concern is sore throat. No dysuria.  Past Medical History:  Diagnosis Date  . Asthma   . Hypertension   . Stress     Patient Active Problem List   Diagnosis Date Noted  . Headache(784.0) 06/02/2013  . Unspecified essential hypertension 06/02/2013    Past Surgical History:  Procedure Laterality Date  . MYOMECTOMY    . TONSILLECTOMY      OB History    No data available       Home Medications    Prior to Admission medications   Medication Sig Start Date End Date Taking? Authorizing Provider  topiramate (TOPAMAX) 25 MG capsule Take 50 mg by mouth daily.   Yes Historical Provider, MD    Family History Family History  Problem Relation Age of Onset  . Ataxia Neg Hx   . Chorea Neg Hx   . Dementia Neg Hx   . Mental retardation Neg Hx   . Migraines Neg Hx   . Multiple sclerosis Neg Hx   . Neurofibromatosis Neg Hx   . Neuropathy Neg Hx   . Parkinsonism Neg Hx   . Seizures Neg Hx   . Stroke Neg Hx     Social History Social History  Substance Use Topics  . Smoking status: Current Some Day Smoker    Types: Cigarettes  . Smokeless tobacco: Never Used  . Alcohol use No     Allergies   Latex   Review  of Systems Review of Systems  Constitutional: Negative for fever.  HENT: Positive for sore throat.   Respiratory: Negative for cough.      Physical Exam Updated Vital Signs BP 129/80 (BP Location: Left Arm)   Pulse 73   Temp 97.9 F (36.6 C) (Oral)   Resp 18   Ht 5\' 6"  (1.676 m)   Wt 89.8 kg   SpO2 100%   BMI 31.96 kg/m   Physical Exam  Constitutional: She appears well-developed and well-nourished. No distress.  HENT:  Head: Atraumatic.  Right Ear: External ear normal.  Left Ear: External ear normal.  Mouth/Throat: Oropharynx is clear and moist.  Throat: Uvula is midline no tonsillar enlargement or exudates, no trismus  Eyes: Conjunctivae are normal.  Neck: Neck supple.  Cardiovascular: Normal rate and regular rhythm.   Pulmonary/Chest: Effort normal and breath sounds normal.  Abdominal: Soft. She exhibits no distension. There is no tenderness.  Genitourinary:  Genitourinary Comments: No CVA tenderness  Lymphadenopathy:    She has no cervical adenopathy.  Neurological: She is alert.  Skin: No rash noted.  Psychiatric: She has a normal mood and affect.  Nursing note and vitals reviewed.    ED Treatments / Results  Labs (all labs ordered  are listed, but only abnormal results are displayed) Labs Reviewed  URINALYSIS, ROUTINE W REFLEX MICROSCOPIC (NOT AT [facility]) - Abnormal; Notable for the following:       Result Value   APPearance CLOUDY (*)    All other components within normal limits  RAPID STREP SCREEN (NOT AT [facility])  CULTURE, GROUP A STREP [facility])  PREGNANCY, URINE    EKG  EKG Interpretation None       Radiology No results found.  Procedures Procedures (including critical care time)  Medications Ordered in ED Medications - No data to display   Initial Impression / Assessment and Plan / ED Course  I have reviewed the triage vital signs and the nursing notes.  Pertinent labs & imaging results that were available during my care of the patient  were reviewed by me and considered in my medical decision making (see chart for details).  Clinical Course     BP 129/80 (BP Location: Left Arm)   Pulse 73   Temp 97.9 F (36.6 C) (Oral)   Resp 18   Ht 5\' 6"  (1.676 m)   Wt 89.8 kg   SpO2 100%   BMI 31.96 kg/m    Final Clinical Impressions(s) / ED Diagnoses   Final diagnoses:  Viral pharyngitis    New Prescriptions New Prescriptions   CHLORHEXIDINE (PERIDEX) 0.12 % SOLUTION    Use as directed 15 mLs in the mouth or throat 2 (two) times daily.   IBUPROFEN (ADVIL,MOTRIN) 800 MG TABLET    Take 1 tablet (800 mg total) by mouth 3 (three) times daily.   11:25 AM Patient here with sore throat, son recently diagnosed with strep throat. Will obtain strep screen. She reported having right sided pain in her abdomen and flank without any CVA tenderness or dysuria. Her urine shows no signs of infection. Her pregnancy test is negative. No reproducible pain on exam.  12:22 PM Strep test is negative. Throat culture sent. Will provide symptomatic treatment including NSAIDs and mouth gargles. Return precaution discussed.   Domenic Moras, PA-C 01/23/16 [place], MD 01/23/16 [phone]

## 2016-01-25 ENCOUNTER — Emergency Department (HOSPITAL_BASED_OUTPATIENT_CLINIC_OR_DEPARTMENT_OTHER): Payer: Medicaid Other

## 2016-01-25 ENCOUNTER — Emergency Department (HOSPITAL_BASED_OUTPATIENT_CLINIC_OR_DEPARTMENT_OTHER)
Admission: EM | Admit: 2016-01-25 | Discharge: 2016-01-25 | Disposition: A | Discharge status: Home / Self Care | Payer: Medicaid Other | Attending: Emergency Medicine | Admitting: Emergency Medicine

## 2016-01-25 ENCOUNTER — Encounter (HOSPITAL_BASED_OUTPATIENT_CLINIC_OR_DEPARTMENT_OTHER): Payer: Self-pay | Admitting: Emergency Medicine

## 2016-01-25 DIAGNOSIS — J45909 Unspecified asthma, uncomplicated: Secondary | ICD-10-CM | POA: Diagnosis not present

## 2016-01-25 DIAGNOSIS — F17210 Nicotine dependence, cigarettes, uncomplicated: Secondary | ICD-10-CM | POA: Diagnosis not present

## 2016-01-25 DIAGNOSIS — R05 Cough: Secondary | ICD-10-CM | POA: Diagnosis present

## 2016-01-25 DIAGNOSIS — J40 Bronchitis, not specified as acute or chronic: Secondary | ICD-10-CM | POA: Diagnosis not present

## 2016-01-25 DIAGNOSIS — I10 Essential (primary) hypertension: Secondary | ICD-10-CM | POA: Insufficient documentation

## 2016-01-25 DIAGNOSIS — Z791 Long term (current) use of non-steroidal anti-inflammatories (NSAID): Secondary | ICD-10-CM | POA: Diagnosis not present

## 2016-01-25 DIAGNOSIS — Z91040 Latex allergy status: Secondary | ICD-10-CM | POA: Insufficient documentation

## 2016-01-25 DIAGNOSIS — Z79899 Other long term (current) drug therapy: Secondary | ICD-10-CM | POA: Insufficient documentation

## 2016-01-25 LAB — CBC WITH DIFFERENTIAL/PLATELET
BASOS PCT: 0 %
Basophils Absolute: 0 10*3/uL (ref 0.0–0.1)
Eosinophils Absolute: 0.2 10*3/uL (ref 0.0–0.7)
Eosinophils Relative: 4 %
HEMATOCRIT: 41 % (ref 36.0–46.0)
Hemoglobin: 13.3 g/dL (ref 12.0–15.0)
Lymphocytes Relative: 33 %
Lymphs Abs: 1.9 10*3/uL (ref 0.7–4.0)
MCH: 27.7 pg (ref 26.0–34.0)
MCHC: 32.4 g/dL (ref 30.0–36.0)
MCV: 85.2 fL (ref 78.0–100.0)
MONO ABS: 0.6 10*3/uL (ref 0.1–1.0)
MONOS PCT: 10 %
NEUTROS ABS: 3 10*3/uL (ref 1.7–7.7)
Neutrophils Relative %: 53 %
Platelets: 246 10*3/uL (ref 150–400)
RBC: 4.81 MIL/uL (ref 3.87–5.11)
RDW: 13.8 % (ref 11.5–15.5)
WBC: 5.7 10*3/uL (ref 4.0–10.5)

## 2016-01-25 LAB — BASIC METABOLIC PANEL
Anion gap: 8 (ref 5–15)
BUN: 22 mg/dL — ABNORMAL HIGH (ref 6–20)
CALCIUM: 9.2 mg/dL (ref 8.9–10.3)
CO2: 22 mmol/L (ref 22–32)
CREATININE: 1.06 mg/dL — AB (ref 0.44–1.00)
Chloride: 110 mmol/L (ref 101–111)
GFR calc Af Amer: >60 mL/min (ref >60)
GFR calc non Af Amer: >60 mL/min (ref >60)
GLUCOSE: 123 mg/dL — AB (ref 65–99)
Potassium: 3.9 mmol/L (ref 3.5–5.1)
Sodium: 140 mmol/L (ref 135–145)

## 2016-01-25 LAB — D-DIMER, QUANTITATIVE: D-Dimer, Quant: 0.73 ug/mL-FEU — ABNORMAL HIGH (ref 0.00–0.50)

## 2016-01-25 MED ORDER — IOPAMIDOL (ISOVUE-370) INJECTION 76%
100.0000 mL | Freq: Once | INTRAVENOUS | Status: AC | PRN
  Administered 2016-01-25: 100 mL via INTRAVENOUS

## 2016-01-25 MED ORDER — DEXAMETHASONE SODIUM PHOSPHATE 10 MG/ML IJ SOLN
10.0000 mg | Freq: Once | INTRAMUSCULAR | Status: AC
  Administered 2016-01-25: 10 mg via INTRAVENOUS
  Filled 2016-01-25: qty 1

## 2016-01-25 MED ORDER — FAMOTIDINE 20 MG PO TABS
40.0000 mg | ORAL_TABLET | Freq: Once | ORAL | Status: AC
  Administered 2016-01-25: 40 mg via ORAL
  Filled 2016-01-25: qty 2

## 2016-01-25 MED ORDER — ALBUTEROL SULFATE (2.5 MG/3ML) 0.083% IN NEBU
2.5000 mg | INHALATION_SOLUTION | Freq: Once | RESPIRATORY_TRACT | Status: AC
  Administered 2016-01-25: 2.5 mg via RESPIRATORY_TRACT
  Filled 2016-01-25: qty 3

## 2016-01-25 MED ORDER — ALBUTEROL SULFATE HFA 108 (90 BASE) MCG/ACT IN AERS
1.0000 | INHALATION_SPRAY | RESPIRATORY_TRACT | Status: DC | PRN
  Administered 2016-01-25: 1 via RESPIRATORY_TRACT
  Filled 2016-01-25: qty 6.7

## 2016-01-25 MED ORDER — AEROCHAMBER PLUS FLO-VU MEDIUM MISC
1.0000 | Freq: Once | Status: DC
  Filled 2016-01-25: qty 1

## 2016-01-25 MED ORDER — HYDROCODONE-HOMATROPINE 5-1.5 MG/5ML PO SYRP: 5.0000 mL | ORAL_SOLUTION | Freq: Four times a day (QID) | ORAL | 0 refills | Status: DC | PRN

## 2016-01-25 MED ORDER — IPRATROPIUM-ALBUTEROL 0.5-2.5 (3) MG/3ML IN SOLN
3.0000 mL | Freq: Once | RESPIRATORY_TRACT | Status: AC
  Administered 2016-01-25: 3 mL via RESPIRATORY_TRACT
  Filled 2016-01-25: qty 3

## 2016-01-25 MED ORDER — DIPHENHYDRAMINE HCL 50 MG/ML IJ SOLN
25.0000 mg | Freq: Once | INTRAMUSCULAR | Status: AC
  Administered 2016-01-25: 25 mg via INTRAVENOUS
  Filled 2016-01-25: qty 1

## 2016-01-25 NOTE — ED Notes (Addendum)
Pt returned from CT c/o bil eye itching; faint pinkish coloring noted to back. No resp distress or other sxs present. PA notified.

## 2016-01-25 NOTE — ED Notes (Signed)
Pt continues to c/o eyes itching; no additional or worsening sxs of allergic reaction.

## 2016-01-25 NOTE — ED Triage Notes (Signed)
Patient states that she was seen here recently for a sore throat. Now has a cough and SOB

## 2016-01-25 NOTE — Discharge Instructions (Signed)
Your symptoms are likely due to a viral infection.  Your CT scan and chest xray were normal today.  Continue take Ibuprofen for pain.  Take Hycodan every 6 hours as needed for cough.  Follow up with your primary care physician.  Return to the ED for sudden worsening shortness of breath, chest pain, or any new or concerning symptoms.

## 2016-01-25 NOTE — ED Provider Notes (Signed)
[place] DEPT [facility] Provider Note   CSN: [id number] Arrival date & time: 01/25/16  0854     History   Chief Complaint Chief Complaint  Patient presents with  . Cough    HPI Shirley [name] is a 40 y.o. female.  HPI Shirley [name] is a 40 y.o. female with PMH significant for asthma who presents with gradual onset, constant, worsening productive cough she describes as "flecks of blood and thick sputum" that began last night.  Associated symptoms include shortness of breath, wheezing, and substernal non-radiating chest pain she describes as pressure that is worse with deep inspiration and coughing.  It began while coughing.  It is not exertional.  No fever, chills, otalgia, N/V/D, or abdominal pain.  She was seen her 2 days ago for sore throat and prescribed ibuprofen and chlorhexidine which she has been taking.  Her son was recently sick with similar symptoms.  She is not on exogenous estrogen.  No recent immobilization, surgery, or hx of DVT/PE.  No unilateral leg swelling. She does not smoke.   Past Medical History:  Diagnosis Date  . Asthma   . Hypertension   . Stress     Patient Active Problem List   Diagnosis Date Noted  . Headache(784.0) 06/02/2013  . Unspecified essential hypertension 06/02/2013    Past Surgical History:  Procedure Laterality Date  . MYOMECTOMY    . TONSILLECTOMY      OB History    No data available       Home Medications    Prior to Admission medications   Medication Sig Start Date End Date Taking? Authorizing Provider  chlorhexidine (PERIDEX) 0.12 % solution Use as directed 15 mLs in the mouth or throat 2 (two) times daily. 01/23/16   Domenic Moras, PA-C  HYDROcodone-homatropine (HYCODAN) 5-1.5 MG/5ML syrup Take 5 mLs by mouth every 6 (six) hours as needed for cough. 01/25/16   Gloriann Loan, PA-C  ibuprofen (ADVIL,MOTRIN) 800 MG tablet Take 1 tablet (800 mg total) by mouth 3 (three) times daily. 01/23/16   Domenic Moras, PA-C  topiramate  (TOPAMAX) 25 MG capsule Take 50 mg by mouth daily.    Historical Provider, MD    Family History Family History  Problem Relation Age of Onset  . Ataxia Neg Hx   . Chorea Neg Hx   . Dementia Neg Hx   . Mental retardation Neg Hx   . Migraines Neg Hx   . Multiple sclerosis Neg Hx   . Neurofibromatosis Neg Hx   . Neuropathy Neg Hx   . Parkinsonism Neg Hx   . Seizures Neg Hx   . Stroke Neg Hx     Social History Social History  Substance Use Topics  . Smoking status: Current Some Day Smoker    Types: Cigarettes  . Smokeless tobacco: Never Used  . Alcohol use No     Allergies   Isovue [iopamidol] and Latex   Review of Systems Review of Systems All other systems negative unless otherwise stated in HPI   Physical Exam Updated Vital Signs BP 106/70   Pulse 74   Temp 98 F (36.7 C) (Oral)   Resp 16   SpO2 96%   Physical Exam  Constitutional: She is oriented to person, place, and time. She appears well-developed and well-nourished. She is active.  Non-toxic appearance. She does not have a sickly appearance. She does not appear ill.  HENT:  Head: Normocephalic and atraumatic.  Right Ear: Tympanic membrane and external ear  normal. Tympanic membrane is not erythematous and not bulging.  Left Ear: Tympanic membrane and external ear normal. Tympanic membrane is not erythematous and not bulging.  Nose: Nose normal.  Mouth/Throat: Uvula is midline, oropharynx is clear and moist and mucous membranes are normal. No trismus in the jaw. No uvula swelling. No oropharyngeal exudate, posterior oropharyngeal edema, posterior oropharyngeal erythema or tonsillar abscesses.  Neck: Normal range of motion. Neck supple.  No nuchal rigidity.   Cardiovascular: Normal rate and regular rhythm.   No unilateral leg swelling.   Pulmonary/Chest: Effort normal. No respiratory distress. She has decreased breath sounds. She has no wheezes. She has no rales.  Abdominal: Soft. Bowel sounds are normal.  She exhibits no distension. There is no tenderness.  Musculoskeletal: Normal range of motion.  Lymphadenopathy:    She has no cervical adenopathy.  Neurological: She is alert and oriented to person, place, and time.  Skin: Skin is warm and dry.  Psychiatric: She has a normal mood and affect. Her behavior is normal.     ED Treatments / Results  Labs (all labs ordered are listed, but only abnormal results are displayed) Labs Reviewed  BASIC METABOLIC PANEL - Abnormal; Notable for the following:       Result Value   Glucose, Bld 123 (*)    BUN 22 (*)    Creatinine, Ser 1.06 (*)    All other components within normal limits  D-DIMER, QUANTITATIVE (NOT AT [facility]) - Abnormal; Notable for the following:    D-Dimer, Quant 0.73 (*)    All other components within normal limits  CBC WITH DIFFERENTIAL/PLATELET    EKG  EKG Interpretation  Date/Time:  Friday January 25 2016 09:34:08 EST Ventricular Rate:  89 PR Interval:    QRS Duration: 92 QT Interval:  364 QTC Calculation: 443 R Axis:   49 Text Interpretation:  Sinus rhythm No significant change since last tracing Confirmed by LIU MD, Hinton Dyer [id number]) on 01/25/2016 9:37:59 AM Also confirmed by Oleta Mouse MD, DANA [phone]), editor [place] CT, Leda Gauze [phone])  on 01/25/2016 10:16:34 AM       Radiology Dg Chest 2 View  Result Date: 01/25/2016 CLINICAL DATA:  C/o cough and congestion x 3 days. Hx smoker. HTN, Asthma EXAM: CHEST  2 VIEW COMPARISON:  05/03/2015 FINDINGS: The heart size and mediastinal contours are within normal limits. Both lungs are clear. No pleural effusion or pneumothorax. The visualized skeletal structures are unremarkable. IMPRESSION: No active cardiopulmonary disease. Electronically Signed   By: Lajean Manes M.D.   On: 01/25/2016 09:19   Ct Angio Chest Pe W/cm &/or Wo Cm  Result Date: 01/25/2016 CLINICAL DATA:  Pt with SOB, cough, congestion, x 3 days, worsening SOB, elevated DDimer 0.73 EXAM: CT ANGIOGRAPHY CHEST WITH  CONTRAST TECHNIQUE: Multidetector CT imaging of the chest was performed using the standard protocol during bolus administration of intravenous contrast. Multiplanar CT image reconstructions and MIPs were obtained to evaluate the vascular anatomy. CONTRAST:  100 mL of Isovue 370 intravenous contrast COMPARISON:  None. FINDINGS: Cardiovascular: Satisfactory opacification of the pulmonary arteries to the segmental level. No evidence of pulmonary embolism. Normal heart size. No pericardial effusion. Thoracic aorta is normal in caliber. No dissection. No atherosclerotic plaque. No coronary artery calcifications. Mediastinum/Nodes: No enlarged mediastinal, hilar, or axillary lymph nodes. Thyroid gland, trachea, and esophagus demonstrate no significant findings. Lungs/Pleura: Lungs are clear. No pleural effusion or pneumothorax. Upper Abdomen: Hepatic steatosis.  No acute findings. Musculoskeletal: No chest wall abnormality. No acute or significant  osseous findings. Review of the MIP images confirms the above findings. IMPRESSION: 1. No evidence of a pulmonary embolism. 2. No acute findings.  Clear lungs. Electronically Signed   By: Lajean Manes M.D.   On: 01/25/2016 11:07    Procedures Procedures (including critical care time)  Medications Ordered in ED Medications  albuterol (PROVENTIL HFA;VENTOLIN HFA) 108 (90 Base) MCG/ACT inhaler 1-2 puff (1 puff Inhalation Given 01/25/16 0954)  AEROCHAMBER PLUS FLO-VU MEDIUM MISC 1 each (not administered)  ipratropium-albuterol (DUONEB) 0.5-2.5 (3) MG/3ML nebulizer solution 3 mL (3 mLs Nebulization Given 01/25/16 0918)  albuterol (PROVENTIL) (2.5 MG/3ML) 0.083% nebulizer solution 2.5 mg (2.5 mg Nebulization Given 01/25/16 0917)  iopamidol (ISOVUE-370) 76 % injection 100 mL (100 mLs Intravenous Contrast Given 01/25/16 1043)  diphenhydrAMINE (BENADRYL) injection 25 mg (25 mg Intravenous Given 01/25/16 1102)  dexamethasone (DECADRON) injection 10 mg (10 mg Intravenous  Given 01/25/16 1135)  famotidine (PEPCID) tablet 40 mg (40 mg Oral Given 01/25/16 1133)     Initial Impression / Assessment and Plan / ED Course  I have reviewed the triage vital signs and the nursing notes.  Pertinent labs & imaging results that were available during my care of the patient were reviewed by me and considered in my medical decision making (see chart for details).  Clinical Course    Patient presents with 1 day of cough with hemoptysis, chest pain, SOB, and wheezing. VSS.  Lung sounds decreased throughout.  Differential includes PE, PNA, bronchitis, asthma exacerbation.  Do not suspect ACS clinically.  Will obtain CXR, EKG, and labs including dimer (PE RFs: obesity, hemoptysis, cough, pleuritic chest pain). Will give duoneb.   9:40 AM: CXR clear.  Patient feels improved after breathing treatment.  She states her chest pain and shortness of breath have improved. Repeat lung exam improved.  Will refill home inhaler.   9:55 AM: dimer elevated, remaining labs unremarkable.  Will obtain CTA chest.   11:20 AM: CT unremarkable.  Patient with generalized itching.  Given Benadryl.  Upon reassessment, continued itching and shortness of breath.  Airway patent.  No signs of anaphylaxis.  Will give Decadron and pepcid and observe.   2:10 PM: Patient has been observed without recurrence of symptoms. She reports improvement. Plan to discharge home with Hycodan.  Sxs likely due to viral bronchitis.  Return precautions discussed.  Stable for discharge.    Final Clinical Impressions(s) / ED Diagnoses   Final diagnoses:  Bronchitis    New Prescriptions New Prescriptions   HYDROCODONE-HOMATROPINE (HYCODAN) 5-1.5 MG/5ML SYRUP    Take 5 mLs by mouth every 6 (six) hours as needed for cough.     Gloriann Loan, PA-C 01/25/16 [place], MD 01/25/16 [phone]

## 2016-01-25 NOTE — ED Notes (Signed)
Pt returned from XR; RT at bedside.

## 2016-01-26 LAB — CULTURE, GROUP A STREP (THRC)

## 2016-02-23 ENCOUNTER — Encounter (HOSPITAL_BASED_OUTPATIENT_CLINIC_OR_DEPARTMENT_OTHER): Payer: Self-pay | Admitting: Emergency Medicine

## 2016-02-23 ENCOUNTER — Emergency Department (HOSPITAL_BASED_OUTPATIENT_CLINIC_OR_DEPARTMENT_OTHER)
Admission: EM | Admit: 2016-02-23 | Discharge: 2016-02-23 | Disposition: A | Discharge status: Home / Self Care | Payer: Medicaid Other | Attending: Emergency Medicine | Admitting: Emergency Medicine

## 2016-02-23 DIAGNOSIS — F17210 Nicotine dependence, cigarettes, uncomplicated: Secondary | ICD-10-CM | POA: Insufficient documentation

## 2016-02-23 DIAGNOSIS — Z91040 Latex allergy status: Secondary | ICD-10-CM | POA: Diagnosis not present

## 2016-02-23 DIAGNOSIS — J45909 Unspecified asthma, uncomplicated: Secondary | ICD-10-CM | POA: Diagnosis not present

## 2016-02-23 DIAGNOSIS — M7918 Myalgia, other site: Secondary | ICD-10-CM

## 2016-02-23 DIAGNOSIS — M546 Pain in thoracic spine: Secondary | ICD-10-CM | POA: Insufficient documentation

## 2016-02-23 DIAGNOSIS — I10 Essential (primary) hypertension: Secondary | ICD-10-CM | POA: Diagnosis not present

## 2016-02-23 DIAGNOSIS — Z79899 Other long term (current) drug therapy: Secondary | ICD-10-CM | POA: Insufficient documentation

## 2016-02-23 DIAGNOSIS — R52 Pain, unspecified: Secondary | ICD-10-CM | POA: Diagnosis present
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
## 2016-02-23 NOTE — ED Notes (Signed)
ED Provider at bedside. 

## 2016-02-23 NOTE — Discharge Instructions (Signed)
Please read attached information. If you experience any new or worsening signs or symptoms please return to the emergency room for evaluation. Please follow-up with your primary care provider or specialist as discussed.  °

## 2016-02-23 NOTE — ED Triage Notes (Signed)
Patient states that she is having "whole" left side x 2 weeks. The patient reports that it is worse today

## 2016-02-23 NOTE — ED Notes (Signed)
Pt reports body pain increases with movement, L side thoracic area tender to palpation. Pt also reports increase in stress.

## 2016-03-01 ENCOUNTER — Emergency Department (HOSPITAL_BASED_OUTPATIENT_CLINIC_OR_DEPARTMENT_OTHER)
Admission: EM | Admit: 2016-03-01 | Discharge: 2016-03-01 | Disposition: A | Discharge status: Home / Self Care | Payer: Medicaid Other | Attending: Emergency Medicine | Admitting: Emergency Medicine

## 2016-03-01 ENCOUNTER — Encounter (HOSPITAL_BASED_OUTPATIENT_CLINIC_OR_DEPARTMENT_OTHER): Payer: Self-pay | Admitting: *Deleted

## 2016-03-01 DIAGNOSIS — N898 Other specified noninflammatory disorders of vagina: Secondary | ICD-10-CM | POA: Insufficient documentation

## 2016-03-01 DIAGNOSIS — Z87891 Personal history of nicotine dependence: Secondary | ICD-10-CM | POA: Insufficient documentation

## 2016-03-01 DIAGNOSIS — J45909 Unspecified asthma, uncomplicated: Secondary | ICD-10-CM | POA: Diagnosis not present

## 2016-03-01 DIAGNOSIS — I10 Essential (primary) hypertension: Secondary | ICD-10-CM | POA: Diagnosis not present

## 2016-03-01 DIAGNOSIS — Z79899 Other long term (current) drug therapy: Secondary | ICD-10-CM | POA: Insufficient documentation

## 2016-03-01 HISTORY — DX: Leiomyoma of uterus, unspecified: O34.10

## 2016-03-01 HISTORY — DX: Maternal care for benign tumor of corpus uteri, unspecified trimester: D25.9

## 2016-03-01 LAB — URINALYSIS, ROUTINE W REFLEX MICROSCOPIC
BILIRUBIN URINE: NEGATIVE
GLUCOSE, UA: NEGATIVE mg/dL
Hgb urine dipstick: NEGATIVE
Ketones, ur: NEGATIVE mg/dL
Leukocytes, UA: NEGATIVE
NITRITE: NEGATIVE
PH: 6 (ref 5.0–8.0)
Protein, ur: NEGATIVE mg/dL
SPECIFIC GRAVITY, URINE: 1.023 (ref 1.005–1.030)

## 2016-03-01 LAB — PREGNANCY, URINE: Preg Test, Ur: NEGATIVE

## 2016-03-01 LAB — WET PREP, GENITAL
Clue Cells Wet Prep HPF POC: NONE SEEN
SPERM: NONE SEEN
TRICH WET PREP: NONE SEEN
YEAST WET PREP: NONE SEEN

## 2016-03-01 MED ORDER — AZITHROMYCIN 250 MG PO TABS
1000.0000 mg | ORAL_TABLET | Freq: Once | ORAL | Status: AC
  Administered 2016-03-01: 1000 mg via ORAL
  Filled 2016-03-01: qty 4

## 2016-03-01 MED ORDER — CEFTRIAXONE SODIUM 250 MG IJ SOLR
250.0000 mg | Freq: Once | INTRAMUSCULAR | Status: AC
  Administered 2016-03-01: 250 mg via INTRAMUSCULAR
  Filled 2016-03-01: qty 250

## 2016-03-01 MED ORDER — LIDOCAINE HCL (PF) 1 % IJ SOLN
INTRAMUSCULAR | Status: AC
  Administered 2016-03-01: 0.9 mL
  Filled 2016-03-01: qty 5

## 2016-03-01 NOTE — ED Provider Notes (Signed)
[facility] Provider Note   CSN: [id number] Arrival date & time: 03/01/16  0857     History   Chief Complaint Chief Complaint  Patient presents with  . Vaginal Itching    HPI [name] is a 40 y.o. female.  HPI   40 year old female with history of uterine fibroid presenting for evaluation of vaginal itching. Patient report 4 days ago her husband was complaining of mild penile discharge and itchiness. Next day she also noticed the same symptoms, states having vaginal itchiness without any significant discharge. No associated fever, sore throat, abdominal pain, back pain, vaginal bleeding, dysuria, or rash. She associated itching due to wearing leg and in the winter which is causing her to have these infections past. She also recall changing in soap and cleaning powder, which [name] attribute to her skin discomfort. She denies any prior history of STD. She has a uterine ablation 10 years ago due to uterine fibroid. No other complaint.  Past Medical History:  Diagnosis Date  . Asthma   . Hypertension   . Stress   . Uterine fibroids affecting pregnancy    reports having a surgical procedure -- unsure what    Patient Active Problem List   Diagnosis Date Noted  . Headache(784.0) 06/02/2013  . Unspecified essential hypertension 06/02/2013    Past Surgical History:  Procedure Laterality Date  . MYOMECTOMY    . TONSILLECTOMY      OB History    No data available       Home Medications    Prior to Admission medications   Medication Sig Start Date End Date Taking? Authorizing Provider  ibuprofen (ADVIL,MOTRIN) 800 MG tablet Take 1 tablet (800 mg total) by mouth 3 (three) times daily. 01/23/16  Yes Domenic Moras, PA-C  topiramate (TOPAMAX) 25 MG capsule Take 50 mg by mouth daily.   Yes Historical Provider, MD  chlorhexidine (PERIDEX) 0.12 % solution Use as directed 15 mLs in the mouth or throat 2 (two) times daily. 01/23/16   Domenic Moras, PA-C    HYDROcodone-homatropine (HYCODAN) 5-1.5 MG/5ML syrup Take 5 mLs by mouth every 6 (six) hours as needed for cough. 01/25/16   Gloriann Loan, PA-C    Family History Family History  Problem Relation Age of Onset  . Ataxia Neg Hx   . Chorea Neg Hx   . Dementia Neg Hx   . Mental retardation Neg Hx   . Migraines Neg Hx   . Multiple sclerosis Neg Hx   . Neurofibromatosis Neg Hx   . Neuropathy Neg Hx   . Parkinsonism Neg Hx   . Seizures Neg Hx   . Stroke Neg Hx     Social History Social History  Substance Use Topics  . Smoking status: Former Smoker    Types: Cigarettes  . Smokeless tobacco: Never Used  . Alcohol use No     Allergies   Isovue [iopamidol] and Latex   Review of Systems Review of Systems  All other systems reviewed and are negative.    Physical Exam Updated Vital Signs BP 130/73 (BP Location: Left Arm)   Pulse 65   Temp 98.1 F (36.7 C) (Oral)   Resp 18   Ht 5\' 6"  (1.676 m)   Wt 89.8 kg   SpO2 100%   BMI 31.96 kg/m   Physical Exam  Constitutional: She appears well-developed and well-nourished. No distress.  HENT:  Head: Atraumatic.  Eyes: Conjunctivae are normal.  Neck: Neck supple.  Genitourinary:  Genitourinary  Comments: Pelvic exam: RN in room as chaperone, external female genitalia normal with no signs of lesions or injuries. Speculum exam shows normal vaginal vault with no obvious discharge. Bimanual exam with no adnexal tenderness, no cervical motion tenderness, uterus not appreciate due to body habitus, no masses appreciated. The external cervical os is not visualized.     Neurological: She is alert.  Skin: No rash noted.  Psychiatric: She has a normal mood and affect.  Nursing note and vitals reviewed.    ED Treatments / Results  Labs (all labs ordered are listed, but only abnormal results are displayed) Labs Reviewed  WET PREP, GENITAL - Abnormal; Notable for the following:       Result Value   WBC, Wet Prep HPF POC MANY (*)     All other components within normal limits  URINALYSIS, ROUTINE W REFLEX MICROSCOPIC - Abnormal; Notable for the following:    APPearance CLOUDY (*)    All other components within normal limits  PREGNANCY, URINE  HIV ANTIBODY (ROUTINE TESTING)  RPR  GC/CHLAMYDIA PROBE AMP ([place]) NOT AT [facility]    EKG  EKG Interpretation None       Radiology No results found.  Procedures Procedures (including critical care time)  Medications Ordered in ED Medications  cefTRIAXone (ROCEPHIN) injection 250 mg (not administered)  azithromycin (ZITHROMAX) tablet 1,000 mg (not administered)     Initial Impression / Assessment and Plan / ED Course  I have reviewed the triage vital signs and the nursing notes.  Pertinent labs & imaging results that were available during my care of the patient were reviewed by me and considered in my medical decision making (see chart for details).  Clinical Course     BP 111/70 (BP Location: Right Arm)   Pulse 60   Temp 98.1 F (36.7 C) (Oral)   Resp 16   Ht 5\' 6"  (1.676 m)   Wt 89.8 kg   SpO2 97%   BMI 31.96 kg/m    Final Clinical Impressions(s) / ED Diagnoses   Final diagnoses:  Vaginal irritation    New Prescriptions New Prescriptions   No medications on file   9:29 AM Patient here with vaginal itching, voicing concern for potential yeast infection or irritation from change in soap product. The pelvic exam is unremarkable. No suspicion for PID.  10:41 AM Wet prep shows many WBC, no evidence to suggest candidal infection or Bacterial vaginosis. Since husband has penile discharge and patient report discomfort with this finding, I offer for prophylactic STD treatment including Rocephin and Zithromax, patient agrees. Encouraged patient to notify partner to get tested if she is positive for STI. Recommend avoiding sexual activities until symptoms completely resolved.   Domenic Moras, PA-C 03/01/16 [place], MD 03/01/16  [phone]

## 2016-03-01 NOTE — Discharge Instructions (Signed)
You have been evaluated for your vaginal irritation.  You will be notify if you are tested positive for any specific sexually transmitted disease in the next few days.

## 2016-03-01 NOTE — ED Triage Notes (Signed)
Pt reports vaginal itching x2days; reports husband reports penile discharge and itching. Denies fever, n/v/d, abnormal vaginal discharge/bleeding, urinary symptoms.

## 2016-03-02 LAB — RPR: RPR: NONREACTIVE

## 2016-03-02 LAB — HIV ANTIBODY (ROUTINE TESTING W REFLEX): HIV SCREEN 4TH GENERATION: NONREACTIVE

## 2016-03-03 LAB — GC/CHLAMYDIA PROBE AMP (~~LOC~~) NOT AT ARMC
Chlamydia: NEGATIVE
Neisseria Gonorrhea: NEGATIVE

## 2016-03-05 ENCOUNTER — Encounter: Payer: Self-pay | Admitting: Family Medicine

## 2016-03-05 ENCOUNTER — Ambulatory Visit (INDEPENDENT_AMBULATORY_CARE_PROVIDER_SITE_OTHER): Payer: Medicaid Other | Admitting: Family Medicine

## 2016-03-05 VITALS — BP 130/87 | HR 69 | Ht 67.0 in | Wt 195.0 lb

## 2016-03-05 DIAGNOSIS — M79602 Pain in left arm: Secondary | ICD-10-CM

## 2016-03-05 DIAGNOSIS — M79603 Pain in arm, unspecified: Secondary | ICD-10-CM

## 2016-03-05 DIAGNOSIS — M79606 Pain in leg, unspecified: Secondary | ICD-10-CM

## 2016-03-05 DIAGNOSIS — M79605 Pain in left leg: Secondary | ICD-10-CM

## 2016-03-05 MED ORDER — NORTRIPTYLINE HCL 25 MG PO CAPS: 25.0000 mg | ORAL_CAPSULE | Freq: Every day | ORAL | 2 refills | Status: DC

## 2016-03-05 NOTE — Patient Instructions (Signed)
I'm concerned you have a peripheral neuropathy. Start the nortriptyline 25mg  at bedtime - after a few days you can increase to 2 tablets at bedtime. We will go ahead with nerve conduction studies of your left arm and your left leg. I will contact you following these to go over results and next steps.

## 2016-03-16 DIAGNOSIS — M79606 Pain in leg, unspecified: Secondary | ICD-10-CM

## 2016-03-16 DIAGNOSIS — M79603 Pain in arm, unspecified: Secondary | ICD-10-CM | POA: Insufficient documentation
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
## 2016-03-16 NOTE — Assessment & Plan Note (Signed)
exam benign.  Has had imaging in past that was reassuring.  Could be due to a peripheral neuropathy.  Will go ahead with NCVs/EMGs, trial nortriptyline at bedtime.

## 2016-04-24 ENCOUNTER — Encounter: Payer: Self-pay | Admitting: Diagnostic Neuroimaging

## 2016-06-12 ENCOUNTER — Encounter: Payer: Self-pay | Admitting: Diagnostic Neuroimaging

## 2016-06-30 ENCOUNTER — Other Ambulatory Visit (HOSPITAL_BASED_OUTPATIENT_CLINIC_OR_DEPARTMENT_OTHER): Payer: Self-pay | Admitting: Psychiatry

## 2016-06-30 ENCOUNTER — Ambulatory Visit (HOSPITAL_BASED_OUTPATIENT_CLINIC_OR_DEPARTMENT_OTHER)
Admission: RE | Admit: 2016-06-30 | Discharge: 2016-06-30 | Disposition: A | Discharge status: Home / Self Care | Payer: Medicaid Other | Source: Ambulatory Visit | Attending: Psychiatry | Admitting: Psychiatry

## 2016-06-30 DIAGNOSIS — M4316 Spondylolisthesis, lumbar region: Secondary | ICD-10-CM | POA: Diagnosis not present

## 2016-06-30 DIAGNOSIS — M545 Low back pain: Secondary | ICD-10-CM | POA: Diagnosis not present

## 2016-06-30 DIAGNOSIS — M1288 Other specific arthropathies, not elsewhere classified, other specified site: Secondary | ICD-10-CM | POA: Insufficient documentation

## 2016-09-25 IMAGING — CT CT ABD-PELV W/ CM
2 of 4 series · 16 of 46 positions shown, 18 images · IV contrast (APPLIED)
Comparison: 12/19/2007

CLINICAL DATA: LEFT lower quadrant pain for a few days, nausea,
hypertension, asthma, smoker, history of cyst and mass removed from
uterus 10+ years ago

EXAM:
CT ABDOMEN AND PELVIS WITH CONTRAST
TECHNIQUE: Multidetector CT imaging of the abdomen and pelvis was performed
using the standard protocol following bolus administration of
intravenous contrast. Sagittal and coronal MPR images reconstructed
from axial data set.
CONTRAST:  25mL OMNIPAQUE IOHEXOL 300 MG/ML SOLN, 100mL OMNIPAQUE
IOHEXOL 300 MG/ML SOLN IV

[Series 2: abd/pelvis 5.0 b31f · axial · 0.92mm/px · z∈[-327,+138]mm · 13 of 103 slices shown, 15 images]
[im 5/103  soft-tissue]
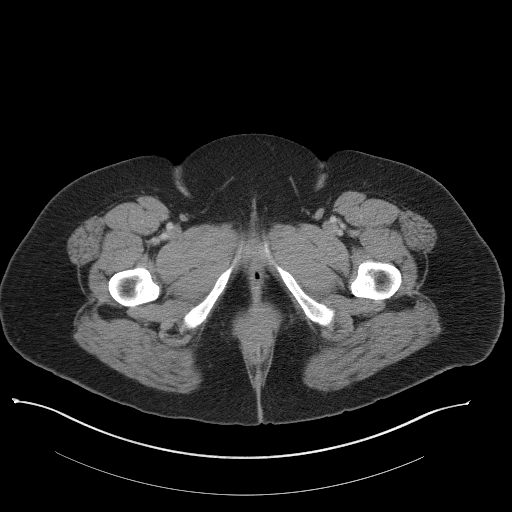
[im 5/103  bone]
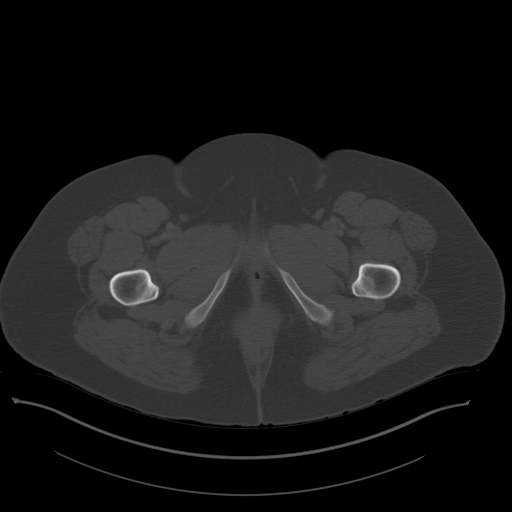
[im 13/103  soft-tissue]
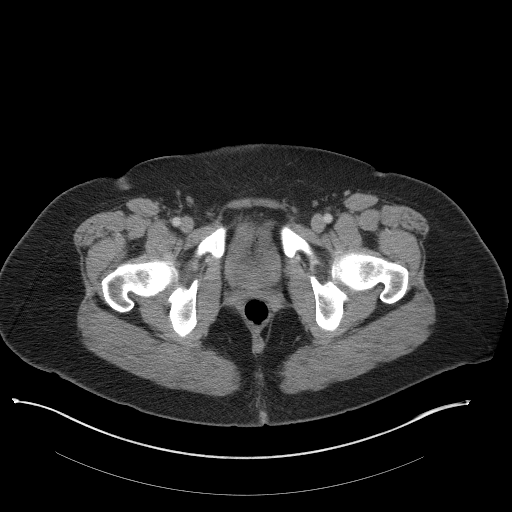
[im 22/103  soft-tissue]
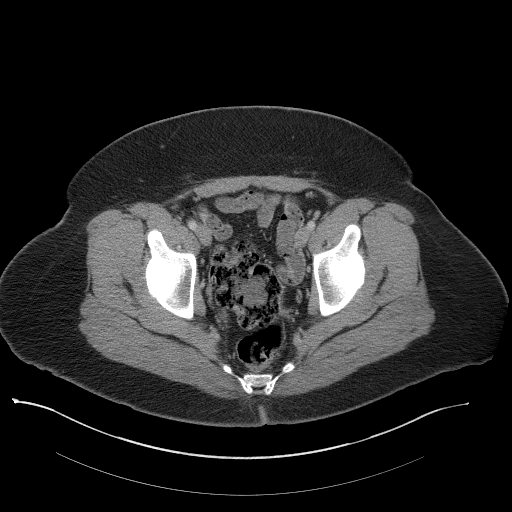
[im 30/103  soft-tissue]
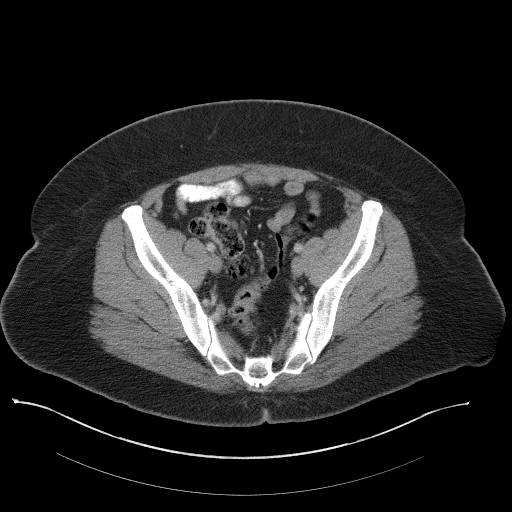
[im 35/103  soft-tissue]
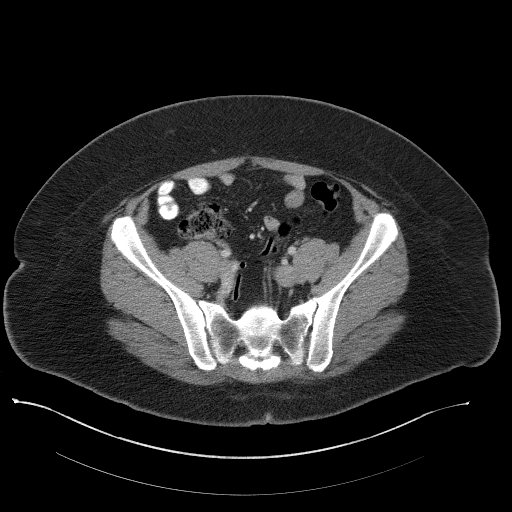
[im 43/103  soft-tissue]
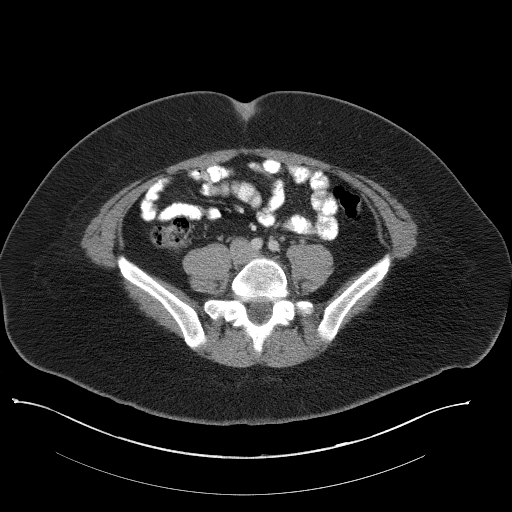
[im 52/103  soft-tissue]
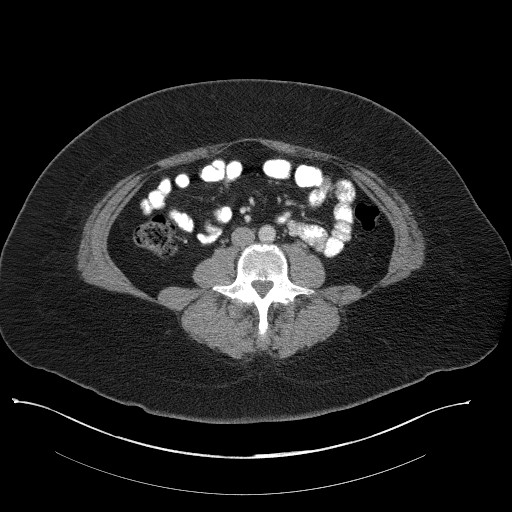
[im 60/103  soft-tissue]
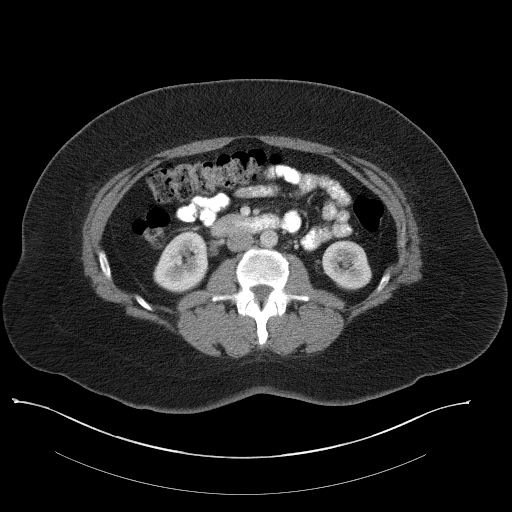
[im 69/103  soft-tissue]
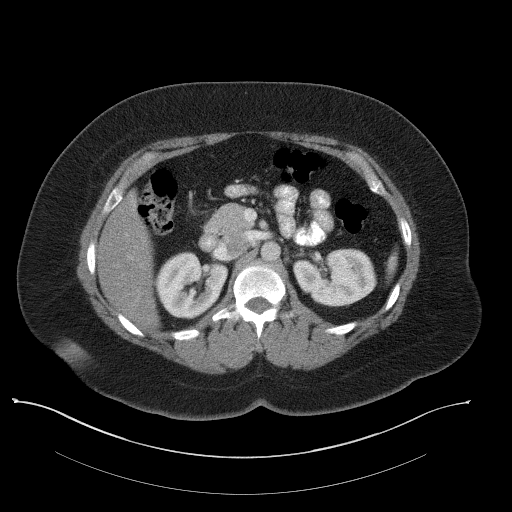
[im 69/103  bone]
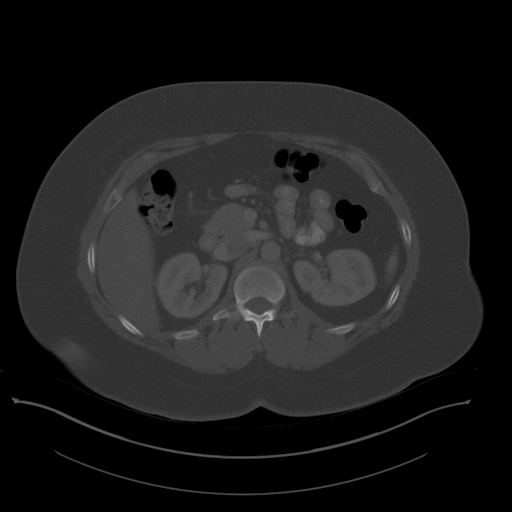
[im 73/103  soft-tissue]
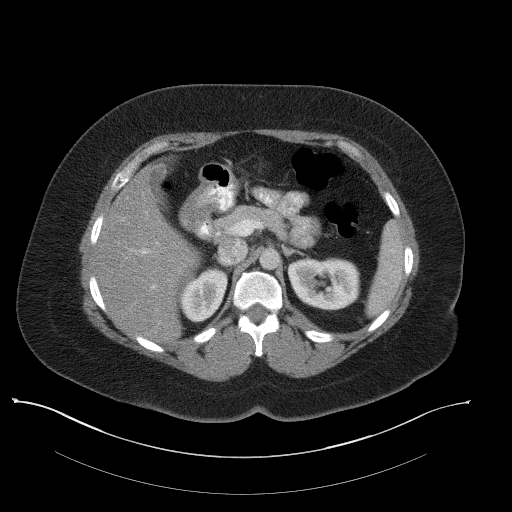
[im 81/103  soft-tissue]
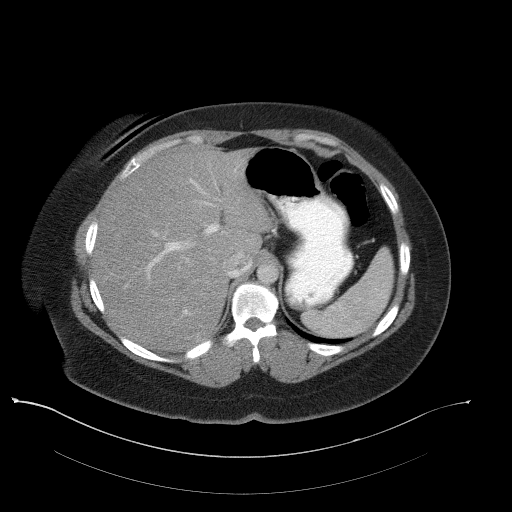
[im 90/103  soft-tissue]
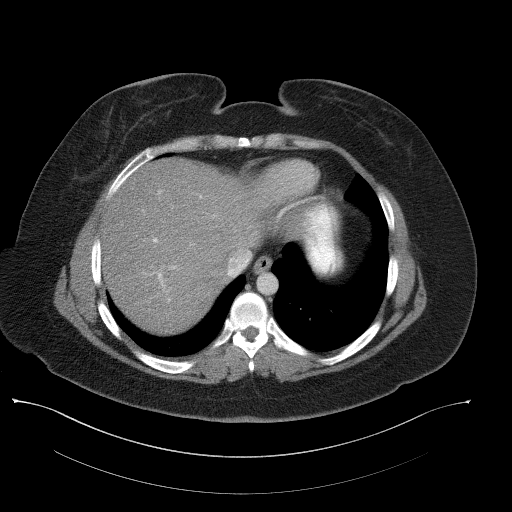
[im 98/103  soft-tissue]
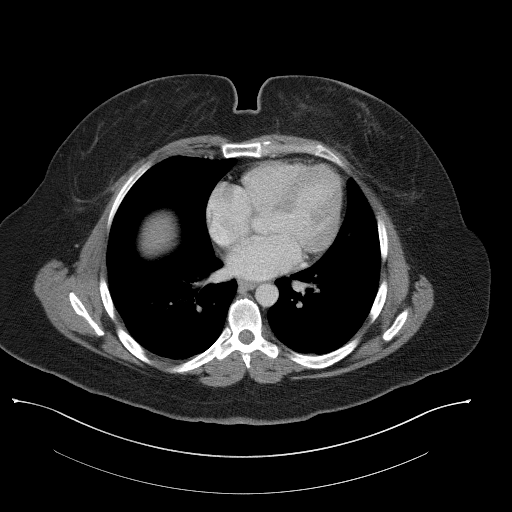

[Series 5: abd/pelvis 3.0 coronal · coronal · 0.82mm/px · 3 of 107 slices shown]
[im 36/107  soft-tissue]
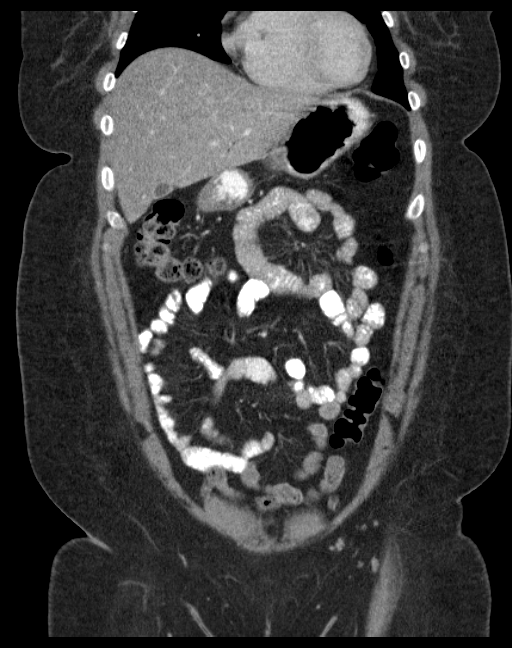
[im 48/107  soft-tissue]
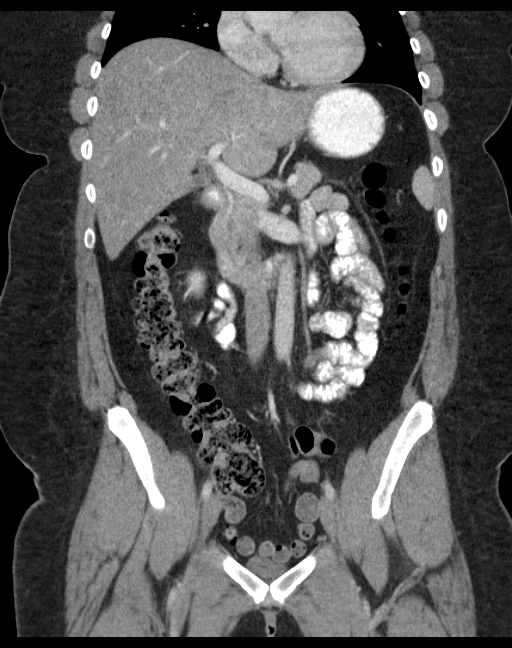
[im 59/107  soft-tissue]
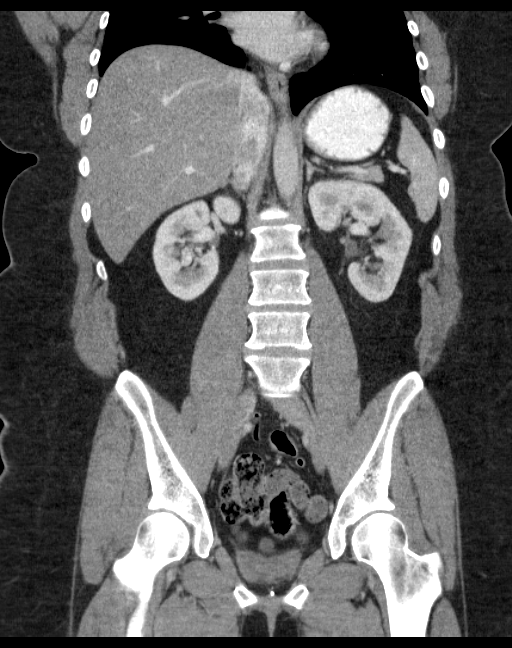

[16 of 46 positions shown; findings below may reference images not displayed]

FINDINGS: Lung bases clear.

Gallbladder contracted.

Small cyst upper pole LEFT kidney 10 mm diameter.

Liver, spleen, pancreas, kidneys, and adrenal glands otherwise
unremarkable.

Question minimal food debris within stomach but unable to exclude
small polyp at fundus.

Normal appendix located low in pelvis.

Bowel loops unremarkable.

Bladder decompressed with unremarkable ureters.

No mass, adenopathy, free fluid, inflammatory process or hernia.

Osseous structures unremarkable.
IMPRESSION: Unable to exclude small polyp at gallbladder fundus.

No acute intra-abdominal or intra pelvic inflammatory process.

## 2017-07-10 ENCOUNTER — Encounter (HOSPITAL_BASED_OUTPATIENT_CLINIC_OR_DEPARTMENT_OTHER): Payer: Self-pay | Admitting: *Deleted

## 2017-07-10 ENCOUNTER — Other Ambulatory Visit: Payer: Self-pay

## 2017-07-10 ENCOUNTER — Emergency Department (HOSPITAL_BASED_OUTPATIENT_CLINIC_OR_DEPARTMENT_OTHER)
Admission: EM | Admit: 2017-07-10 | Discharge: 2017-07-10 | Disposition: A | Discharge status: Home / Self Care | Payer: Medicaid Other | Attending: Emergency Medicine | Admitting: Emergency Medicine

## 2017-07-10 DIAGNOSIS — Z79899 Other long term (current) drug therapy: Secondary | ICD-10-CM | POA: Insufficient documentation

## 2017-07-10 DIAGNOSIS — Z87891 Personal history of nicotine dependence: Secondary | ICD-10-CM | POA: Diagnosis not present

## 2017-07-10 DIAGNOSIS — M7918 Myalgia, other site: Secondary | ICD-10-CM | POA: Diagnosis not present

## 2017-07-10 DIAGNOSIS — M79601 Pain in right arm: Secondary | ICD-10-CM | POA: Diagnosis present

## 2017-07-10 DIAGNOSIS — I10 Essential (primary) hypertension: Secondary | ICD-10-CM | POA: Insufficient documentation

## 2017-07-10 DIAGNOSIS — Z91040 Latex allergy status: Secondary | ICD-10-CM | POA: Insufficient documentation

## 2017-07-10 DIAGNOSIS — J45909 Unspecified asthma, uncomplicated: Secondary | ICD-10-CM | POA: Insufficient documentation

## 2017-07-10 MED ORDER — ACETAMINOPHEN 325 MG PO TABS: 650.0000 mg | ORAL_TABLET | Freq: Four times a day (QID) | ORAL | 0 refills | Status: DC | PRN

## 2017-07-10 MED ORDER — PREDNISONE 10 MG (21) PO TBPK: ORAL_TABLET | Freq: Every day | ORAL | 0 refills | Status: DC

## 2017-07-10 NOTE — Discharge Instructions (Signed)
You were given a prescription for tylenol for your symptoms. You may take 650mg  every 6 hours for your symptoms. You were also given a prescription for prednisone which you should take as directed on your discharge paperwork.  Please follow up with your sports medicine doctor in 1 week for a re-evaluation. Return to the emergency department immediately if you experience any back pain associated with fevers, loss of control of your bowels/bladder, weakness/numbness to your legs, numbness to your groin area, inability to walk, or inability to urinate. You should also return immediately for any chest pain, shortness of breath.

## 2017-07-10 NOTE — ED Provider Notes (Signed)
[name] Provider Note   CSN: [id number] Arrival date & time: 07/10/17  1605     History   Chief Complaint Chief Complaint  Patient presents with  . Extremity Pain    HPI Shirley [name] is a 42 y.o. female.  HPI  She is a 42 year old female who presents the ED today complaining of diffuse right arm pain that has been present for about 2 weeks.  She is also complaining of right leg pain that has been present for the last month.  States that her right arm pain is diffuse in nature and she feels like it hurts when she lifts it up in the area.  States pain is constant.  Denies any redness or swelling to the arm.  Denies fevers chest pain or shortness of breath.  She denies any recent injuries or falls.  States she has never had pain like this before.  With regard to her leg pain she states that pain is located to the lateral anterior portion of her right thigh.  Pain feels sharp and stabbing in nature, "like electric shock ".  She denies any numbness or weakness to her arms or legs.  No loss control of bowels or bladder function.  States she has chronic low back pain.  Patient has a history of chronic pain and has been seen by sports medicine in the past for similar symptoms on the left side.  States pain is constant and severe in nature.  Worse when she is walking.  She has not tried anything at home for the pain.  Denies leg or arm swelling/redness/edema, hemoptysis, recent surgery/trauma, recent long travel, hormone use, personal hx of cancer, or hx of DVT/PE.    Past Medical History:  Diagnosis Date  . Asthma   . Hypertension   . Stress   . Uterine fibroids affecting pregnancy    reports having a surgical procedure -- unsure what    Patient Active Problem List   Diagnosis Date Noted  . Upper and lower extremity pain 03/16/2016  . Lumbar radiculopathy 06/11/2015  . Headache(784.0) 06/02/2013  . Unspecified essential hypertension 06/02/2013     Past Surgical History:  Procedure Laterality Date  . MYOMECTOMY    . TONSILLECTOMY       OB History   None      Home Medications    Prior to Admission medications   Medication Sig Start Date End Date Taking? Authorizing Provider  acetaminophen (TYLENOL) 325 MG tablet Take 2 tablets (650 mg total) by mouth every 6 (six) hours as needed. Do not take more than 4000mg  of tylenol per day 07/10/17   Pamlea Finder S, PA-C  chlorhexidine (PERIDEX) 0.12 % solution Use as directed 15 mLs in the mouth or throat 2 (two) times daily. 01/23/16   Domenic Moras, PA-C  ibuprofen (ADVIL,MOTRIN) 800 MG tablet Take 1 tablet (800 mg total) by mouth 3 (three) times daily. 01/23/16   Domenic Moras, PA-C  predniSONE (STERAPRED UNI-PAK 21 TAB) 10 MG (21) TBPK tablet Take by mouth daily. Take 6 tabs by mouth daily  for 2 days, then 5 tabs for 2 days, then 4 tabs for 2 days, then 3 tabs for 2 days, 2 tabs for 2 days, then 1 tab by mouth daily for 2 days 07/10/17   Kingson Lohmeyer S, PA-C  topiramate (TOPAMAX) 50 MG tablet TK 1 T PO QD 03/11/16   [provider]    Family History Family History  Problem  Relation Age of Onset  . Ataxia Neg Hx   . Chorea Neg Hx   . Dementia Neg Hx   . Mental retardation Neg Hx   . Migraines Neg Hx   . Multiple sclerosis Neg Hx   . Neurofibromatosis Neg Hx   . Neuropathy Neg Hx   . Parkinsonism Neg Hx   . Seizures Neg Hx   . Stroke Neg Hx     Social History Social History   Tobacco Use  . Smoking status: Former Smoker    Types: Cigarettes  . Smokeless tobacco: Never Used  Substance Use Topics  . Alcohol use: No  . Drug use: No     Allergies   Isovue [iopamidol] and Latex   Review of Systems Review of Systems  Constitutional: Negative for fever.  Respiratory: Negative for shortness of breath.   Cardiovascular: Negative for chest pain.  Gastrointestinal: Negative for abdominal pain, constipation, diarrhea, nausea and vomiting.  Musculoskeletal:  Positive for back pain (chronic).       Right arm pain, right leg pain  Skin: Negative for color change.  Neurological: Negative for weakness and numbness.     Physical Exam Updated Vital Signs BP 128/78   Pulse 82   Temp 98.2 F (36.8 C)   Resp 16   Ht 5\' 6"  (1.676 m)   Wt 122.5 kg (270 lb)   SpO2 97%   BMI 43.58 kg/m   Physical Exam  Constitutional: She appears well-developed and well-nourished. No distress.  HENT:  Head: Normocephalic and atraumatic.  Eyes: Conjunctivae are normal.  Neck: Normal range of motion. Neck supple.  Cardiovascular: Normal rate, regular rhythm, normal heart sounds and intact distal pulses.  No murmur heard. Pulmonary/Chest: Effort normal and breath sounds normal. No respiratory distress. She has no wheezes.  Abdominal: Soft. Bowel sounds are normal. There is no tenderness.  Musculoskeletal: Normal range of motion.  ttp to right lateral thigh, no ttp along deep vein tracts, no palpable cord, no erythema, swelling, or edema to the RLE. No ttp to right shoulder, scapula, or clavicle, negative crossover, negative empty can, negative hawkins, no erythema or swelling to joint or RUE. FROM and strength intact bilat. Pt has diffuse ttp to lumbar spine that is nonfocal. She jumped off the bed with very light palpation of spine and had same reaction when I applied no pressure to the lumbar spine diffusely. No stepoff and no overlying skin changes.  Neurological: She is alert.  Motor:  Normal tone. 5/5 strength of BUE and BLE major muscle groups including strong and equal grip strength and dorsiflexion/plantar flexion Sensory: light touch normal in all extremities. DTRs: patellar 2+ symmetric b/l Gait: normal gait and balance. Able to walk on toes and heels with ease.  CV: 2+ radial and DP/PT pulses  Skin: Skin is warm and dry. Capillary refill takes less than 2 seconds.  Psychiatric: She has a normal mood and affect.  Nursing note and vitals  reviewed.    ED Treatments / Results  Labs (all labs ordered are listed, but only abnormal results are displayed) Labs Reviewed - No data to display  EKG None  Radiology No results found.  Procedures Procedures (including critical care time)  Medications Ordered in ED Medications - No data to display   Initial Impression / Assessment and Plan / ED Course  I have reviewed the triage vital signs and the nursing notes.  Pertinent labs & imaging results that were available during my care of the  patient were reviewed by me and considered in my medical decision making (see chart for details).   Final Clinical Impressions(s) / ED Diagnoses   Final diagnoses:  Musculoskeletal pain   Patient with chronic pain presenting with pain to her right upper extremity right lower extremity.  Vital signs are stable.  Patient is in no distress and she is nontoxic-appearing.  Do not suspect deep vein thrombosis to right upper or right lower extremity.  Patient is PERC negative.  Right upper extremity has normal strength and sensation, I do not suspect septic joint of the shoulder.  Do not suspect rotator cuff tear or impingement.  Suspect patient has muscle strain and advised Tylenol and ibuprofen for this.  With regards to leg pain suspect patient has meralgia paresthetica based on her description of pain and location of pain.  Advised wearing loose pants for the next 7 days or wearing dresses to reduce pressure on the nerve.  Will give steroid taper for symptoms.  Gave Tylenol and ibuprofen as well.  Advised her to follow-up with her sport medicine doctor for further treatment of her symptoms.  Gave her strict return precautions for any new or worsening symptoms.  All cautions were answered and the patient understands plan and reasons to return immediately to the ED.  ED Discharge Orders        Ordered    acetaminophen (TYLENOL) 325 MG tablet  Every 6 hours PRN     07/10/17 1700    predniSONE  (STERAPRED UNI-PAK 21 TAB) 10 MG (21) TBPK tablet  Daily     07/10/17 1700       Rodney Booze, PA-C 07/10/17 1745    Margette Fast, MD 07/11/17 [phone]

## 2017-07-10 NOTE — ED Notes (Signed)
ED Provider at bedside. 

## 2017-07-10 NOTE — ED Triage Notes (Addendum)
Amb w/o difficulty to rm 1 to triage Pt c/o right leg pain x 1 month land right arm pain x 2 seeks , HX chronic pain

## 2018-01-27 ENCOUNTER — Encounter (HOSPITAL_BASED_OUTPATIENT_CLINIC_OR_DEPARTMENT_OTHER): Payer: Self-pay | Admitting: Emergency Medicine

## 2018-01-27 ENCOUNTER — Other Ambulatory Visit: Payer: Self-pay

## 2018-01-27 ENCOUNTER — Emergency Department (HOSPITAL_BASED_OUTPATIENT_CLINIC_OR_DEPARTMENT_OTHER)
Admission: EM | Admit: 2018-01-27 | Discharge: 2018-01-27 | Disposition: A | Discharge status: Home / Self Care | Payer: Medicaid Other | Attending: Emergency Medicine | Admitting: Emergency Medicine

## 2018-01-27 DIAGNOSIS — M79652 Pain in left thigh: Secondary | ICD-10-CM | POA: Insufficient documentation

## 2018-01-27 DIAGNOSIS — I10 Essential (primary) hypertension: Secondary | ICD-10-CM | POA: Insufficient documentation

## 2018-01-27 DIAGNOSIS — Z87891 Personal history of nicotine dependence: Secondary | ICD-10-CM | POA: Insufficient documentation

## 2018-01-27 DIAGNOSIS — M79605 Pain in left leg: Secondary | ICD-10-CM

## 2018-01-27 DIAGNOSIS — M25531 Pain in right wrist: Secondary | ICD-10-CM | POA: Insufficient documentation

## 2018-01-27 DIAGNOSIS — J45909 Unspecified asthma, uncomplicated: Secondary | ICD-10-CM | POA: Insufficient documentation

## 2018-01-27 DIAGNOSIS — Z79899 Other long term (current) drug therapy: Secondary | ICD-10-CM | POA: Insufficient documentation

## 2018-01-27 DIAGNOSIS — Z91040 Latex allergy status: Secondary | ICD-10-CM | POA: Insufficient documentation

## 2018-01-27 MED ORDER — PREDNISONE 10 MG (21) PO TBPK: ORAL_TABLET | Freq: Every day | ORAL | 0 refills | Status: DC

## 2018-01-27 MED ORDER — ACETAMINOPHEN 325 MG PO TABS
650.0000 mg | ORAL_TABLET | Freq: Once | ORAL | Status: AC
  Administered 2018-01-27: 650 mg via ORAL
  Filled 2018-01-27: qty 2

## 2018-01-27 NOTE — ED Triage Notes (Signed)
Left thigh pain for 1.5 months.  Pt states it is worse.  Pt also c/o right wrist pain which is her dominant hand.  Pt states she had some vomiting over the weekend but that has ceased.  No recent travel or hormone therapy.

## 2018-01-27 NOTE — ED Provider Notes (Signed)
[facility] EMERGENCY DEPARTMENT Provider Note   CSN: [id number] Arrival date & time: 01/27/18  6389   History   Chief Complaint Chief Complaint  Patient presents with  . Leg Pain    HPI Shirley [name] is a 42 y.o. female with past medical history significant for hypertension, chronic pain, chronic headache who presents for evaluation of left thigh pain and right wrist pain.  Patient states she has had left thigh pain for approximately 2 months.  Pain is intermittent in nature.  Pain is located to the medial lateral left thigh.  Describes her pain as a nagging sensation.  Rates her pain a 6/10.  Patient states she had to go home from work early approximately 1 week ago due to pain.  Patient also complains of right wrist pain.  Pain is located to the dorsal aspect of her right wrist.  Denies any radiation of her pain.  Patient states she had similar symptoms approximately 2 years ago.  Patient states pain self resolved with wrist splint and ibuprofen.  Patient has not taken anything for her pain. Denies fever, chills, nausea, abdominal pain, low back pain, numbness or tingling in her extremities, decreased range of motion, swelling, redness or warmth to her extremities.  Denies any recent trauma or injuries.  Denies exogenous hormone therapy, recent travel, recent surgery, history of DVT or PE.  Denies aggravating or alleviating factors.   History obtained from patient.  No interpreter was used.  HPI  Past Medical History:  Diagnosis Date  . Asthma   . Hypertension   . Stress   . Uterine fibroids affecting pregnancy    reports having a surgical procedure -- unsure what    Patient Active Problem List   Diagnosis Date Noted  . Upper and lower extremity pain 03/16/2016  . Lumbar radiculopathy 06/11/2015  . Headache(784.0) 06/02/2013  . Unspecified essential hypertension 06/02/2013    Past Surgical History:  Procedure Laterality Date  . MYOMECTOMY    . TONSILLECTOMY         OB History   None      Home Medications    Prior to Admission medications   Medication Sig Start Date End Date Taking? Authorizing Provider  acetaminophen (TYLENOL) 325 MG tablet Take 2 tablets (650 mg total) by mouth every 6 (six) hours as needed. Do not take more than 4000mg  of tylenol per day 07/10/17   Couture, Cortni S, PA-C  chlorhexidine (PERIDEX) 0.12 % solution Use as directed 15 mLs in the mouth or throat 2 (two) times daily. 01/23/16   Domenic Moras, PA-C  ibuprofen (ADVIL,MOTRIN) 800 MG tablet Take 1 tablet (800 mg total) by mouth 3 (three) times daily. 01/23/16   Domenic Moras, PA-C  predniSONE (STERAPRED UNI-PAK 21 TAB) 10 MG (21) TBPK tablet Take by mouth daily. Take 6 tabs by mouth daily  for 2 days, then 5 tabs for 2 days, then 4 tabs for 2 days, then 3 tabs for 2 days, 2 tabs for 2 days, then 1 tab by mouth daily for 2 days 01/27/18   Gertrue Willette A, PA-C  topiramate (TOPAMAX) 50 MG tablet TK 1 T PO QD 03/11/16   [provider]    Family History Family History  Problem Relation Age of Onset  . Ataxia Neg Hx   . Chorea Neg Hx   . Dementia Neg Hx   . Mental retardation Neg Hx   . Migraines Neg Hx   . Multiple sclerosis Neg Hx   .  Neurofibromatosis Neg Hx   . Neuropathy Neg Hx   . Parkinsonism Neg Hx   . Seizures Neg Hx   . Stroke Neg Hx     Social History Social History   Tobacco Use  . Smoking status: Former Smoker    Types: Cigarettes  . Smokeless tobacco: Never Used  Substance Use Topics  . Alcohol use: No  . Drug use: No     Allergies   Isovue [iopamidol] and Latex   Review of Systems Review of Systems  Constitutional: Negative.   Respiratory: Negative.   Cardiovascular: Negative.   Gastrointestinal: Negative.   Genitourinary: Negative.   Musculoskeletal: Negative for back pain.       Right wrist and left thigh pain.  Skin: Negative.   Neurological: Negative.   All other systems reviewed and are negative.    Physical  Exam Updated Vital Signs BP 129/86   Pulse (!) 50   Temp 98.1 F (36.7 C)   Resp 18   Ht 5\' 6"  (1.676 m)   Wt 91.2 kg   SpO2 99%   BMI 32.44 kg/m   Physical Exam  Constitutional: She appears well-developed and well-nourished. No distress.  HENT:  Head: Atraumatic.  Mouth/Throat: Oropharynx is clear and moist.  Eyes: Pupils are equal, round, and reactive to light.  Neck: Normal range of motion.  Cardiovascular: Normal rate, regular rhythm, normal heart sounds and intact distal pulses. Exam reveals no gallop and no friction rub.  No murmur heard. Pulmonary/Chest: Effort normal and breath sounds normal. No stridor. No respiratory distress. She has no wheezes. She has no rales. She exhibits no tenderness.  Abdominal: Soft. Bowel sounds are normal. She exhibits no distension and no mass. There is no tenderness. There is no rebound and no guarding.  Musculoskeletal: Normal range of motion. She exhibits no edema or deformity.  Tenderness to deep palpation to left anterior medial lateral thigh.  No tenderness palpation to posterior thigh or left posterior fossa.  No tenderness along deep vein tracts.  There is no erythema, edema, swelling or warmth.  No evidence of palpable cord. There is tenderness to palpation to right dorsal aspect of wrist.  No tenderness palpation to radius or ulna.  Tenderness does not extend into metacarpals or phalanges.  No tenderness to palpation to cervical spine, bilateral upper extremity.  Full range of motion to bilateral upper and lower extremity. Negative tinnel and phalen test. No tenderness palpation to thoracic or lumbar spine.  No step-offs to spine. Full range of motion of the T-spine and L-spine with flexion, hyperextension, and lateral flexion. No midline tenderness or stepoffs. No tenderness to palpation of the spinous processes of the T-spine or L-spine. No  tenderness to palpation of the paraspinous muscles of the L-spine.Negative straight leg  raise. Lymphadenopathy:    Neurological: She is alert. She has normal strength. She displays normal reflexes. She exhibits normal muscle tone.  Reflex Scores:      Bicep reflexes are 2+ on the right side and 2+ on the left side.      Brachioradialis reflexes are 2+ on the right side and 2+ on the left side.      Patellar reflexes are 2+ on the right side and 2+ on the left side. Intact sensation to sharp and dull bilateral upper and lower extremity. Speech is clear and goal oriented, follows commands Normal 5/5 strength in upper and lower extremities bilaterally including dorsiflexion and plantar flexion, strong and equal grip strength Sensation normal  to light and sharp touch Moves extremities without ataxia, coordination intact Normal gait Normal balance No Clonus  Skin: Skin is warm and dry.  No edema, erythema, ecchymosis or warmth.  No rashes or lesions.  Psychiatric: She has a normal mood and affect.  Nursing note and vitals reviewed.   ED Treatments / Results  Labs (all labs ordered are listed, but only abnormal results are displayed) Labs Reviewed - No data to display  EKG None  Radiology No results found.  Procedures Procedures (including critical care time)  Medications Ordered in ED Medications  acetaminophen (TYLENOL) tablet 650 mg (650 mg Oral Given 01/27/18 1131)     Initial Impression / Assessment and Plan / ED Course  I have reviewed the triage vital signs and the nursing notes.  Pertinent labs & imaging results that were available during my care of the patient were reviewed by me and considered in my medical decision making (see chart for details).  42 year old female presents for evaluation of left thigh pain and right wrist pain.  Afebrile, nonseptic, non-ill-appearing.  Low suspicion for DVT to bilateral upper or lower extremity.  PERC negative. No edema, erythema, ecchymosis or warmth.  Normal musculoskeletal exam.  Neurovascularly intact.  Low  suspicion for septic joint as the cause of her wrist pain. Negative tinnel and phalen test. Patient does state that she spend a lot of time typing on the computer as well as using her cell phone.  Patient states she notices that her wrist pain tends to "flare up" when she types on the computer frequently.  Her wrist pain is most likely from overuse.  Discussed with her bracing her wrist as well as proper body mechanics with ibuprofen and Tylenol for pain.  Discussed follow-up with PCP for reevaluation if she has continued pain.  Denies numbness or tingling. Patient states she has had similar symptoms in her right thigh approximately 6 months ago.  Patient states she was told to start wearing loose pants due to pressure on her nerve.  Review of her chart it looks like patient was suspected pain due to meralgia paresthetica.  Patient states she was given steroids with resolve of her symptoms.  Low suspicion for emergent pathology causing patient's pain.  Will DC home with prednisone taper as well as Tylenol and ibuprofen.  Discussed with patient follow-up with PCP for reevaluation if she has continued pain.  Discussed strict return precautions.  Patient voiced understanding and is agreeable for follow-up.  Patient is hemodynamically stable and appropriate for DC home at this time.    Final Clinical Impressions(s) / ED Diagnoses   Final diagnoses:  Left leg pain  Right wrist pain    ED Discharge Orders         Ordered    predniSONE (STERAPRED UNI-PAK 21 TAB) 10 MG (21) TBPK tablet  Daily     01/27/18 1122           Terrell Ostrand A, PA-C 01/27/18 2001    Quintella Reichert, MD 01/28/18 [phone]

## 2018-01-27 NOTE — Discharge Instructions (Signed)
You were waited today for leg pain and wrist pain.  Pain is most likely musculoskeletal nature.  I have prescribed you prednisone as well as well.  Please follow-up with your PCP for reevaluation if you have continued symptoms.  Return to the ED for any new or worsening symptoms

## 2018-01-27 NOTE — ED Notes (Signed)
Patient placed in room, vital done. Getting in gown

## 2018-01-27 NOTE — ED Notes (Signed)
NAD at this time. Pt is stable and going home.  

## 2018-02-06 ENCOUNTER — Other Ambulatory Visit: Payer: Self-pay

## 2018-02-06 ENCOUNTER — Emergency Department (HOSPITAL_BASED_OUTPATIENT_CLINIC_OR_DEPARTMENT_OTHER)
Admission: EM | Admit: 2018-02-06 | Discharge: 2018-02-06 | Disposition: A | Discharge status: Home / Self Care | Payer: Medicaid Other | Attending: Emergency Medicine | Admitting: Emergency Medicine

## 2018-02-06 ENCOUNTER — Emergency Department (HOSPITAL_BASED_OUTPATIENT_CLINIC_OR_DEPARTMENT_OTHER): Payer: Medicaid Other

## 2018-02-06 ENCOUNTER — Encounter (HOSPITAL_BASED_OUTPATIENT_CLINIC_OR_DEPARTMENT_OTHER): Payer: Self-pay | Admitting: Emergency Medicine

## 2018-02-06 DIAGNOSIS — Z87891 Personal history of nicotine dependence: Secondary | ICD-10-CM | POA: Insufficient documentation

## 2018-02-06 DIAGNOSIS — I10 Essential (primary) hypertension: Secondary | ICD-10-CM | POA: Insufficient documentation

## 2018-02-06 DIAGNOSIS — Z91040 Latex allergy status: Secondary | ICD-10-CM | POA: Insufficient documentation

## 2018-02-06 DIAGNOSIS — M79675 Pain in left toe(s): Secondary | ICD-10-CM

## 2018-02-06 DIAGNOSIS — S93492A Sprain of other ligament of left ankle, initial encounter: Secondary | ICD-10-CM | POA: Insufficient documentation

## 2018-02-06 DIAGNOSIS — Y998 Other external cause status: Secondary | ICD-10-CM | POA: Insufficient documentation

## 2018-02-06 DIAGNOSIS — Y9389 Activity, other specified: Secondary | ICD-10-CM | POA: Insufficient documentation

## 2018-02-06 DIAGNOSIS — J45909 Unspecified asthma, uncomplicated: Secondary | ICD-10-CM | POA: Insufficient documentation

## 2018-02-06 DIAGNOSIS — W1789XA Other fall from one level to another, initial encounter: Secondary | ICD-10-CM | POA: Insufficient documentation

## 2018-02-06 DIAGNOSIS — Y92008 Other place in unspecified non-institutional (private) residence as the place of occurrence of the external cause: Secondary | ICD-10-CM | POA: Insufficient documentation

## 2018-02-06 DIAGNOSIS — S93491A Sprain of other ligament of right ankle, initial encounter: Secondary | ICD-10-CM

## 2018-02-06 MED ORDER — HYDROCODONE-ACETAMINOPHEN 5-325 MG PO TABS
1.0000 | ORAL_TABLET | Freq: Once | ORAL | Status: AC
  Administered 2018-02-06: 1 via ORAL
  Filled 2018-02-06: qty 1

## 2018-02-06 NOTE — ED Triage Notes (Signed)
Patient states that she fell off her porch yesterday and hurt her right ankle. She reports that she fell and then woke up in her back - the patient state that she has bruising to her left big toe as well

## 2018-02-06 NOTE — Discharge Instructions (Addendum)
You have been diagnosed today with right ankle sprain and left toe pain.  At this time there does not appear to be the presence of an emergent medical condition, however there is always the potential for conditions to change. Please read and follow the below instructions.  Please return to the Emergency Department immediately for any new or worsening symptoms or if your symptoms do not improve. Please be sure to follow up with your Primary Care Provider next week regarding your visit today; please call their office to schedule an appointment even if you are feeling better for a follow-up visit. You have been given a referral to the sports medicine specialist Dr. Barbaraann Barthel.  Please call his office today to schedule an appointment for next week regarding your ankle sprain and toe pain. Please use rest, ice and elevation to help with your symptoms.  You may use the ankle brace, crutches and postop shoe to help with your symptoms. Your x-rays today did not show fracture or dislocation however small fractures may still be present.  It is important for you to follow-up with your primary care doctor and the sports medicine specialist for further evaluation if your pain continues.  Additionally x-rays do not show a ligamentous or tendon injury, sprains and strains are likely still present and it is important for you to follow-up with your primary care doctor and sports medicine specialist for further evaluation.  Get help right away if: Your toes or foot becomes numb or blue. You have severe pain that gets worse. You have fever You have redness or color change to your extremity You have increased swelling Get help right away if: Your foot is numb or tingling. Your foot or toes are swollen. Your foot or toes turn white or blue. You have warmth and redness along your foot.  Please read the additional information packets attached to your discharge summary.  Do not take your medicine if develop an itchy  rash, swelling in your mouth or lips, or difficulty breathing.

## 2018-02-06 NOTE — ED Provider Notes (Signed)
[facility] EMERGENCY DEPARTMENT Provider Note   CSN: [id number] Arrival date & time: 02/06/18  1003     History   Chief Complaint Chief Complaint  Patient presents with  . Ankle Pain    HPI Shirley [name] is a 42 y.o. female presenting today for right ankle pain and left first toe pain following fall that occurred yesterday.  Patient states that she was taking her dog for a walk at 4 PM yesterday when her dog started to run causing her to fall off the porch.  Patient states that everything happened very quickly and after her dog began to run the next thing she knew is she was on her back laying on the ground still with her cell phone and cigarette in her hands.  Patient states that she was able to remove herself from the ground and retrieve her dog without difficulty however shortly after she began having right ankle pain.  She describes her right ankle pain as a severe intensity constant throbbing pain primarily to the medial side of her ankle that is worsened with ambulation and palpation.  Patient states that she has not taken anything for her pain prior to arrival.  Patient states that this morning she began developing left first toe pain and swelling.  She describes her pain as a severe intensity throbbing constant pain that is worse with ambulation and palpation.  Again patient has not taken anything for her pain prior to arrival.  Patient denies loss of consciousness, she states that she was able to hold onto her cigarette and cell phone throughout the fall and these were still in her hands when she got up off the ground.  She denies headache, vision changes, nausea/vomiting, neck pain, back pain, blood thinner use, chest pain, shortness of breath abdominal pain or any other injury from her fall aside from the right ankle and left toe pain.  HPI  Past Medical History:  Diagnosis Date  . Asthma   . Hypertension   . Stress   . Uterine fibroids affecting pregnancy    reports having a surgical procedure -- unsure what    Patient Active Problem List   Diagnosis Date Noted  . Upper and lower extremity pain 03/16/2016  . Lumbar radiculopathy 06/11/2015  . Headache(784.0) 06/02/2013  . Unspecified essential hypertension 06/02/2013    Past Surgical History:  Procedure Laterality Date  . MYOMECTOMY    . TONSILLECTOMY       OB History   None      Home Medications    Prior to Admission medications   Medication Sig Start Date End Date Taking? Authorizing Provider  acetaminophen (TYLENOL) 325 MG tablet Take 2 tablets (650 mg total) by mouth every 6 (six) hours as needed. Do not take more than 4000mg  of tylenol per day 07/10/17   Couture, Cortni S, PA-C  chlorhexidine (PERIDEX) 0.12 % solution Use as directed 15 mLs in the mouth or throat 2 (two) times daily. 01/23/16   Domenic Moras, PA-C  ibuprofen (ADVIL,MOTRIN) 800 MG tablet Take 1 tablet (800 mg total) by mouth 3 (three) times daily. 01/23/16   Domenic Moras, PA-C  predniSONE (STERAPRED UNI-PAK 21 TAB) 10 MG (21) TBPK tablet Take by mouth daily. Take 6 tabs by mouth daily  for 2 days, then 5 tabs for 2 days, then 4 tabs for 2 days, then 3 tabs for 2 days, 2 tabs for 2 days, then 1 tab by mouth daily for 2 days 01/27/18  Henderly, Britni A, PA-C  topiramate (TOPAMAX) 50 MG tablet TK 1 T PO QD 03/11/16   [provider]    Family History Family History  Problem Relation Age of Onset  . Ataxia Neg Hx   . Chorea Neg Hx   . Dementia Neg Hx   . Mental retardation Neg Hx   . Migraines Neg Hx   . Multiple sclerosis Neg Hx   . Neurofibromatosis Neg Hx   . Neuropathy Neg Hx   . Parkinsonism Neg Hx   . Seizures Neg Hx   . Stroke Neg Hx     Social History Social History   Tobacco Use  . Smoking status: Former Smoker    Types: Cigarettes  . Smokeless tobacco: Never Used  Substance Use Topics  . Alcohol use: No  . Drug use: No     Allergies   Isovue [iopamidol] and Latex   Review  of Systems Review of Systems  Constitutional: Negative.  Negative for chills and fever.  Eyes: Negative.  Negative for visual disturbance.  Respiratory: Negative.  Negative for shortness of breath.   Cardiovascular: Negative.  Negative for chest pain.  Gastrointestinal: Negative.  Negative for abdominal pain, nausea and vomiting.  Musculoskeletal: Positive for arthralgias (Right ankle, left toe) and joint swelling (Right ankle, left toe). Negative for neck pain.  Neurological: Negative.  Negative for dizziness, syncope (Patient denies syncope), weakness, numbness and headaches.   Physical Exam Updated Vital Signs BP (!) 144/95 (BP Location: Left Arm)   Pulse 85   Temp 97.8 F (36.6 C) (Oral)   Resp 18   Ht 5\' 6"  (1.676 m)   Wt 91.2 kg   SpO2 100%   BMI 32.45 kg/m   Physical Exam  Constitutional: She is oriented to person, place, and time. She appears well-developed and well-nourished. No distress.  HENT:  Head: Normocephalic and atraumatic. Head is without raccoon's eyes, without Battle's sign, without abrasion and without contusion.  Right Ear: Hearing, tympanic membrane, external ear and ear canal normal. No hemotympanum.  Left Ear: Hearing, tympanic membrane, external ear and ear canal normal. No hemotympanum.  Nose: Nose normal.  Mouth/Throat: Uvula is midline, oropharynx is clear and moist and mucous membranes are normal.  Eyes: Pupils are equal, round, and reactive to light. Conjunctivae and EOM are normal.  Neck: Trachea normal, normal range of motion, full passive range of motion without pain and phonation normal. Neck supple. No tracheal tenderness, no spinous process tenderness and no muscular tenderness present. No tracheal deviation present.  Cardiovascular: Normal rate, regular rhythm, normal heart sounds and intact distal pulses.  Pulses:      Radial pulses are 2+ on the right side, and 2+ on the left side.       Dorsalis pedis pulses are 2+ on the right side, and 2+  on the left side.       Posterior tibial pulses are 2+ on the right side, and 2+ on the left side.  Pulmonary/Chest: Effort normal and breath sounds normal. No respiratory distress. She has no decreased breath sounds. She exhibits no tenderness, no crepitus and no deformity.  No signs of injury to the chest  Abdominal: Soft. Normal appearance. There is no tenderness. There is no rigidity, no rebound and no guarding.  No signs of injury to the abdomen  Musculoskeletal: Normal range of motion.       Right knee: Normal.       Left knee: Normal.  Right ankle: She exhibits swelling. She exhibits no ecchymosis and no deformity. Tenderness. Medial malleolus and AITFL tenderness found. Achilles tendon normal.       Left ankle: Normal.       Cervical back: Normal.       Thoracic back: Normal.       Lumbar back: Normal.       Right lower leg: Normal.       Left lower leg: Normal.       Right foot: Normal.       Left foot: There is tenderness and swelling. There is normal range of motion and normal capillary refill.  No midline spinal tenderness to palpation.  No crepitus step-off or deformity of the spine.  No paraspinal muscular tenderness to palpation.  There is no sign of injury to the patient's back or neck.  Patient with minimal swelling present to right ankle, pain greatest to palpation across medial malleolus.  No deformity or color change.  No increased warmth.  No fluctuance or induration or signs of infection.  Patient is able to flex and extend ankle with decreased range of motion due to pain.  Strength intact to all movements and resisted range of motion intact.  Patient with minimal swelling and erythema to left first toe.  No deformity.  Patient is able to flex and extend the toe with some increased pain but with full range of motion and strength.  No increased warmth, induration or fluctuance noted.  Patient with pedal pulses intact and equal bilaterally.  Capillary refill intact to  all toes.  Sensation intact to bilateral lower extremities.  All compartments soft to palpation bilaterally.  Feet:  Right Foot:  Protective Sensation: 3 sites tested. 3 sites sensed.  Left Foot:  Protective Sensation: 3 sites tested. 3 sites sensed.  Neurological: She is alert and oriented to person, place, and time. GCS eye subscore is 4. GCS verbal subscore is 5. GCS motor subscore is 6.  Mental Status: Alert, oriented, thought content appropriate, able to give a coherent history. Speech fluent without evidence of aphasia. Able to follow 2 step commands without difficulty. Cranial Nerves: II: Peripheral visual fields grossly normal, pupils equal, round, reactive to light III,IV, VI: ptosis not present, extra-ocular motions intact bilaterally V,VII: smile symmetric, eyebrows raise symmetric, facial light touch sensation equal VIII: hearing grossly normal to voice X: uvula elevates symmetrically XI: bilateral shoulder shrug symmetric and strong XII: midline tongue extension without fassiculations Motor: Normal tone. 5/5 strength in upper and lower extremities bilaterally including strong and equal grip strength and dorsiflexion/plantar flexion Sensory: Sensation intact to light touch in all extremities.Negative Romberg.  Cerebellar: normal finger-to-nose with bilateral upper extremities. Normal heel-to -shin balance bilaterally of the lower extremity. No pronator drift.  Gait: Limping gait due to right ankle pain CV: distal pulses palpable throughout  Skin: Skin is warm and dry. Capillary refill takes less than 2 seconds.  Psychiatric: She has a normal mood and affect. Her behavior is normal.   ED Treatments / Results  Labs (all labs ordered are listed, but only abnormal results are displayed) Labs Reviewed - No data to display  EKG None  Radiology Dg Ankle Complete Right  Result Date: 02/06/2018 CLINICAL DATA:  Acute RIGHT ankle pain following fall. Initial encounter.  EXAM: RIGHT ANKLE - COMPLETE 3+ VIEW COMPARISON:  None. FINDINGS: There is no evidence of fracture, dislocation, or joint effusion. There is no evidence of arthropathy or other focal bone abnormality. Soft tissues are  unremarkable. IMPRESSION: Negative. Electronically Signed   By: Margarette Canada M.D.   On: 02/06/2018 10:50   Dg Foot Complete Left  Result Date: 02/06/2018 CLINICAL DATA:  [place] off porch yesterday injuring RIGHT ankle, bruising, pain, negative RIGHT ankle radiographs EXAM: LEFT FOOT - COMPLETE 3+ VIEW COMPARISON:  None FINDINGS: Osseous mineralization normal. Joint spaces preserved. No fracture, dislocation, or bone destruction. IMPRESSION: Normal exam. Electronically Signed   By: Lavonia Dana M.D.   On: 02/06/2018 11:36   Dg Foot Complete Right  Result Date: 02/06/2018 CLINICAL DATA:  Fall from porch, right foot and ankle pain EXAM: RIGHT FOOT COMPLETE - 3+ VIEW COMPARISON:  02/06/2018 FINDINGS: There is no evidence of fracture or dislocation. There is no evidence of arthropathy or other focal bone abnormality. Soft tissues are unremarkable. IMPRESSION: No acute osseous finding. Electronically Signed   By: Jerilynn Mages.  Shick M.D.   On: 02/06/2018 11:38    Procedures Procedures (including critical care time)  Medications Ordered in ED Medications  HYDROcodone-acetaminophen (NORCO/VICODIN) 5-325 MG per tablet 1 tablet (1 tablet Oral Given 02/06/18 1119)     Initial Impression / Assessment and Plan / ED Course  I have reviewed the triage vital signs and the nursing notes.  Pertinent labs & imaging results that were available during my care of the patient were reviewed by me and considered in my medical decision making (see chart for details).    42 year old female presenting today for right ankle and left first toe pain following fall that occurred at 4 PM yesterday.  No other injuries/complaints aside from right ankle and left toe pain today.  Pain controlled with single Norco here  emergency department.  Patient without headache, normal neuro exam, not on anticoagulation, no signs of head injury on examination, no nausea/vomiting, no dangerous mechanism, no amnesia, no seizure-like activity, GCS of 15.  Patient negative per [place] CT head rule.  Patient without neck or back pain.  Normal neuro exam.  No midline spinal tenderness.  No altered level consciousness.  No distracting injury.  C-spine cleared via Nexus criteria.  No signs of injury to the chest, abdomen or back.  Patient with mild swelling to right ankle and left first toe.  Imaging today negative for acute findings.  Neurovascular intact to bilateral lower extremities.  Patient is able to ambulate with limping gait.  No signs of infection/septic joint, DVT, neurovascular compromise or compartment syndrome.  Given ASO brace for right ankle pain and postop shoe for left toe pain.  Crutches to assist with ambulation.  Encouraged rest, ice and elevation for symptoms.  Given sports medicine follow-up and encouraged to call today to schedule appointment for next week for further evaluation.  Afebrile, not tachycardic, not hypotensive well-appearing in no acute distress.  At this time there does not appear to be any evidence of an acute emergency medical condition and the patient appears stable for discharge with appropriate outpatient follow up. Diagnosis was discussed with patient who verbalizes understanding of care plan and is agreeable to discharge. I have discussed return precautions with patient and family at bedside who verbalize understanding of return precautions. Patient strongly encouraged to follow-up with their PCP and Sports Med within one week. All questions answered.  Note: Portions of this report [name] have been transcribed using voice recognition software. Every effort was made to ensure accuracy; however, inadvertent computerized transcription errors [name] still be present. Final Clinical Impressions(s) /  ED Diagnoses   Final diagnoses:  Sprain of anterior talofibular  ligament of right ankle, initial encounter  Toe pain, left    ED Discharge Orders    None       Gari Crown 02/06/18 1447    Jola Schmidt, MD 02/07/18 [phone]

## 2018-02-09 ENCOUNTER — Encounter: Payer: Self-pay | Admitting: Family Medicine

## 2018-02-09 ENCOUNTER — Ambulatory Visit (INDEPENDENT_AMBULATORY_CARE_PROVIDER_SITE_OTHER): Payer: Self-pay | Admitting: Family Medicine

## 2018-02-09 VITALS — BP 168/92 | HR 98 | Ht 67.0 in | Wt 209.0 lb

## 2018-02-09 DIAGNOSIS — S99922A Unspecified injury of left foot, initial encounter: Secondary | ICD-10-CM

## 2018-02-09 DIAGNOSIS — S99911A Unspecified injury of right ankle, initial encounter: Secondary | ICD-10-CM

## 2018-02-09 NOTE — Progress Notes (Signed)
PCP: Patient, No Pcp Per  Subjective:   HPI: Patient is a 42 y.o. female here for right ankle, left toe injury.  Patient reports on November 22 she was about 6-1/2 feet up on a porch when she went to restrain her dog and fell off of this though she is unsure how she turned and landed but has had right lateral dorsal ankle foot pain as well as left great toe pain. Pain in left great toe is a 10 out of 10 and sharp, worse with walking, right ankle is 9 out of 10 and also worse with walking. She is using an ASO on the right ankle which is helpful and she is ambulating with crutches. Associated swelling and all the areas noted. No skin changes besides the bruising in her left great toe no numbness.  Past Medical History:  Diagnosis Date  . Asthma   . Hypertension   . Stress   . Uterine fibroids affecting pregnancy    reports having a surgical procedure -- unsure what    Current Outpatient Medications on File Prior to Visit  Medication Sig Dispense Refill  . topiramate (TOPAMAX) 50 MG tablet TK 1 T PO QD  3   No current facility-administered medications on file prior to visit.     Past Surgical History:  Procedure Laterality Date  . MYOMECTOMY    . TONSILLECTOMY      Allergies  Allergen Reactions  . Isovue [Iopamidol] Itching    Eye redness/swelling and itching post contrast, hives on upper back  . Latex     rash    Social History   Socioeconomic History  . Marital status: Married    Spouse name: Not on file  . Number of children: Not on file  . Years of education: Not on file  . Highest education level: Not on file  Occupational History  . Not on file  Social Needs  . Financial resource strain: Not on file  . Food insecurity:    Worry: Not on file    Inability: Not on file  . Transportation needs:    Medical: Not on file    Non-medical: Not on file  Tobacco Use  . Smoking status: Former Smoker    Types: Cigarettes  . Smokeless tobacco: Never Used   Substance and Sexual Activity  . Alcohol use: No  . Drug use: No  . Sexual activity: Yes    Birth control/protection: None, Surgical  Lifestyle  . Physical activity:    Days per week: Not on file    Minutes per session: Not on file  . Stress: Not on file  Relationships  . Social connections:    Talks on phone: Not on file    Gets together: Not on file    Attends religious service: Not on file    Active member of club or organization: Not on file    Attends meetings of clubs or organizations: Not on file    Relationship status: Not on file  . Intimate partner violence:    Fear of current or ex partner: Not on file    Emotionally abused: Not on file    Physically abused: Not on file    Forced sexual activity: Not on file  Other Topics Concern  . Not on file  Social History Narrative  . Not on file    Family History  Problem Relation Age of Onset  . Ataxia Neg Hx   . Chorea Neg Hx   .  Dementia Neg Hx   . Mental retardation Neg Hx   . Migraines Neg Hx   . Multiple sclerosis Neg Hx   . Neurofibromatosis Neg Hx   . Neuropathy Neg Hx   . Parkinsonism Neg Hx   . Seizures Neg Hx   . Stroke Neg Hx     BP (!) 168/92   Pulse 98   Ht 5\' 7"  (1.702 m)   Wt 209 lb (94.8 kg)   BMI 32.73 kg/m   Review of Systems: See HPI above.     Objective:  Physical Exam:  Gen: NAD, comfortable in exam room  Right ankle/foot: Mild lateral swelling.  No bruising, other deformity. Very limited motion all directions, strength limited by pain. TTP greatest over ATFL, anterior ankle joint, lateral malleolus.  No base 5th, navicular, fibular head, medial tenderness. 1+ ant drawer and talar tilt.   Negative syndesmotic compression. Thompsons test negative. NV intact distally.  Left ankle/foot: Swelling with bruising distal great toe.  No malrotation, angulation, other deformity. FROM ankle with 5/5 strength.  Able to flex and extend great toe. TTP over distal phalanx of great toe.   No proximal tenderness ,foot tenderness. Negative ant drawer and talar tilt.   NV intact distally.   MSK u/s right ankle: peroneal tendons intact, no cortical irregularities of the talar dome, lateral malleolus, base of the fifth metatarsal.  MSK u/s of the left great toe shows the flexor and extension tendons are intact without abnormalities.  Assessment & Plan:  1.  Right ankle injury: Independently reviewed radiographs no evidence of fracture.  Ultrasound was reassuring.  Consistent with lateral ankle sprain.  Icing, Aleve twice a day with food.  Elevation, crutches if needed.  She declined cam walker today and instead will continue with her ASO.  Start motion exercises.  Follow-up at the end of next week and hopefully can start physical therapy or Thera-Band and balance exercises.  2.  Left great toe injury: Consistent with contusion and hematoma distally.  Exam otherwise reassuring.  Icing, Aleve, elevation.

## 2018-02-09 NOTE — Patient Instructions (Signed)
You have an ankle sprain and left toe hematoma. Ice the area for 15 minutes at a time, 3-4 times a day Aleve 2 tabs twice a day with food for pain and inflammation. Elevate above the level of your heart when possible Crutches if needed to help with walking Bear weight when tolerated Use brace when up and walking around to help with stability while you recover from this injury. Come out of the brace twice a day to do Up/down and alphabet exercises 2-3 sets of each. Consider physical therapy for strengthening and balance exercises in the future. If not improving as expected, we may repeat x-rays or consider further testing like an MRI. Follow up at the end of next week.

## 2018-02-18 ENCOUNTER — Ambulatory Visit (INDEPENDENT_AMBULATORY_CARE_PROVIDER_SITE_OTHER): Payer: Self-pay | Admitting: Family Medicine

## 2018-02-18 ENCOUNTER — Encounter: Payer: Self-pay | Admitting: Family Medicine

## 2018-02-18 VITALS — BP 159/91 | HR 76 | Ht 66.0 in | Wt 209.0 lb

## 2018-02-18 DIAGNOSIS — S99911D Unspecified injury of right ankle, subsequent encounter: Secondary | ICD-10-CM

## 2018-02-18 DIAGNOSIS — S99922D Unspecified injury of left foot, subsequent encounter: Secondary | ICD-10-CM

## 2018-02-18 NOTE — Progress Notes (Signed)
PCP: Patient, No Pcp Per  Subjective:   HPI: Patient is a 42 y.o. female here for right ankle, left toe injury.  11/26: Patient reports on November 22 she was about 6-1/2 feet up on a porch when she went to restrain her dog and fell off of this though she is unsure how she turned and landed but has had right lateral dorsal ankle foot pain as well as left great toe pain. Pain in left great toe is a 10 out of 10 and sharp, worse with walking, right ankle is 9 out of 10 and also worse with walking. She is using an ASO on the right ankle which is helpful and she is ambulating with crutches. Associated swelling and all the areas noted. No skin changes besides the bruising in her left great toe no numbness.  12/5: Patient reports she's doing very well. Pain level 0/10 in both the lateral right ankle and the left great toe. Some soreness if walking a lot though. Doing home exercises, wearing ASO on right. Feels a little hesitant still. No skin changes, swelling.  Past Medical History:  Diagnosis Date  . Asthma   . Hypertension   . Stress   . Uterine fibroids affecting pregnancy    reports having a surgical procedure -- unsure what    Current Outpatient Medications on File Prior to Visit  Medication Sig Dispense Refill  . topiramate (TOPAMAX) 50 MG tablet TK 1 T PO QD  3   No current facility-administered medications on file prior to visit.     Past Surgical History:  Procedure Laterality Date  . MYOMECTOMY    . TONSILLECTOMY      Allergies  Allergen Reactions  . Isovue [Iopamidol] Itching    Eye redness/swelling and itching post contrast, hives on upper back  . Latex     rash    Social History   Socioeconomic History  . Marital status: Married    Spouse name: Not on file  . Number of children: Not on file  . Years of education: Not on file  . Highest education level: Not on file  Occupational History  . Not on file  Social Needs  . Financial resource strain:  Not on file  . Food insecurity:    Worry: Not on file    Inability: Not on file  . Transportation needs:    Medical: Not on file    Non-medical: Not on file  Tobacco Use  . Smoking status: Former Smoker    Types: Cigarettes  . Smokeless tobacco: Never Used  Substance and Sexual Activity  . Alcohol use: No  . Drug use: No  . Sexual activity: Yes    Birth control/protection: None, Surgical  Lifestyle  . Physical activity:    Days per week: Not on file    Minutes per session: Not on file  . Stress: Not on file  Relationships  . Social connections:    Talks on phone: Not on file    Gets together: Not on file    Attends religious service: Not on file    Active member of club or organization: Not on file    Attends meetings of clubs or organizations: Not on file    Relationship status: Not on file  . Intimate partner violence:    Fear of current or ex partner: Not on file    Emotionally abused: Not on file    Physically abused: Not on file    Forced sexual activity:  Not on file  Other Topics Concern  . Not on file  Social History Narrative  . Not on file    Family History  Problem Relation Age of Onset  . Ataxia Neg Hx   . Chorea Neg Hx   . Dementia Neg Hx   . Mental retardation Neg Hx   . Migraines Neg Hx   . Multiple sclerosis Neg Hx   . Neurofibromatosis Neg Hx   . Neuropathy Neg Hx   . Parkinsonism Neg Hx   . Seizures Neg Hx   . Stroke Neg Hx     BP (!) 159/91   Pulse 76   Ht 5\' 6"  (1.676 m)   Wt 209 lb (94.8 kg)   BMI 33.73 kg/m   Review of Systems: See HPI above.     Objective:  Physical Exam:  Gen: NAD, comfortable in exam room  Right ankle/foot: Minimal lateral swelling.  No other gross deformity, swelling, ecchymoses Mod limitation ROM all directions with 5/5 strength though.   TTP over ATFL.  No malleolar, base 5th, navicular, other tenderness. 1+ ant drawer and talar tilt.   Negative syndesmotic compression. Thompsons test negative. NV  intact distally.  Left ankle/foot: Minimal swelling without bruising distal great toe.  No malrotation, angulation. FROM with 5/5 strength. No tenderness to palpation. NVI distally.  Assessment & Plan:  1.  Right ankle injury: 2/2 sprain.  Patient much better.  Continue ASO.  Start strengthening exercises with theraband, balance.  Aleve as needed, icing if needed.  Elevation.  F/u in 4 weeks.  2. Left great toe injury - 2/2 contusion, hematoma.  Much improved also.  F/u prn for this.

## 2018-02-18 NOTE — Patient Instructions (Signed)
You have an ankle sprain and left toe hematoma. Ice the area for 15 minutes at a time, 3-4 times a day only if needed. Aleve 2 tabs twice a day with food for pain and inflammation only if needed. Elevate above the level of your heart when possible Use brace when up and walking around to help with stability while you recover from this injury. Start theraband strengthening and balance exercises now 3 sets of 10 once a day. Follow up with me in 4 weeks.

## 2018-03-18 ENCOUNTER — Ambulatory Visit: Payer: Medicaid Other | Admitting: Family Medicine

## 2018-12-08 ENCOUNTER — Encounter (HOSPITAL_BASED_OUTPATIENT_CLINIC_OR_DEPARTMENT_OTHER): Payer: Self-pay | Admitting: Emergency Medicine

## 2018-12-08 ENCOUNTER — Emergency Department (HOSPITAL_BASED_OUTPATIENT_CLINIC_OR_DEPARTMENT_OTHER): Payer: Self-pay

## 2018-12-08 ENCOUNTER — Emergency Department (HOSPITAL_BASED_OUTPATIENT_CLINIC_OR_DEPARTMENT_OTHER)
Admission: EM | Admit: 2018-12-08 | Discharge: 2018-12-08 | Disposition: A | Discharge status: Home / Self Care | Payer: Self-pay | Attending: Emergency Medicine | Admitting: Emergency Medicine

## 2018-12-08 ENCOUNTER — Other Ambulatory Visit: Payer: Self-pay

## 2018-12-08 DIAGNOSIS — M79602 Pain in left arm: Secondary | ICD-10-CM | POA: Insufficient documentation

## 2018-12-08 DIAGNOSIS — G43909 Migraine, unspecified, not intractable, without status migrainosus: Secondary | ICD-10-CM | POA: Insufficient documentation

## 2018-12-08 DIAGNOSIS — I10 Essential (primary) hypertension: Secondary | ICD-10-CM | POA: Insufficient documentation

## 2018-12-08 DIAGNOSIS — J45909 Unspecified asthma, uncomplicated: Secondary | ICD-10-CM | POA: Insufficient documentation

## 2018-12-08 DIAGNOSIS — Z87891 Personal history of nicotine dependence: Secondary | ICD-10-CM | POA: Insufficient documentation

## 2018-12-08 HISTORY — DX: Migraine, unspecified, not intractable, without status migrainosus: G43.909

## 2018-12-08 LAB — URINALYSIS, ROUTINE W REFLEX MICROSCOPIC
Bilirubin Urine: NEGATIVE
Glucose, UA: NEGATIVE mg/dL
Hgb urine dipstick: NEGATIVE
Ketones, ur: NEGATIVE mg/dL
Leukocytes,Ua: NEGATIVE
Nitrite: NEGATIVE
Protein, ur: NEGATIVE mg/dL
Specific Gravity, Urine: >1.03 — ABNORMAL HIGH (ref 1.005–1.030)
pH: 5.5 (ref 5.0–8.0)

## 2018-12-08 LAB — BASIC METABOLIC PANEL
Anion gap: 9 (ref 5–15)
BUN: 16 mg/dL (ref 6–20)
CO2: 23 mmol/L (ref 22–32)
Calcium: 8.8 mg/dL — ABNORMAL LOW (ref 8.9–10.3)
Chloride: 108 mmol/L (ref 98–111)
Creatinine, Ser: 0.96 mg/dL (ref 0.44–1.00)
GFR calc Af Amer: >60 mL/min (ref >60)
GFR calc non Af Amer: >60 mL/min (ref >60)
Glucose, Bld: 110 mg/dL — ABNORMAL HIGH (ref 70–99)
Potassium: 4.1 mmol/L (ref 3.5–5.1)
Sodium: 140 mmol/L (ref 135–145)

## 2018-12-08 LAB — CBC WITH DIFFERENTIAL/PLATELET
Abs Immature Granulocytes: 0.02 10*3/uL (ref 0.00–0.07)
Basophils Absolute: 0 10*3/uL (ref 0.0–0.1)
Basophils Relative: 1 %
Eosinophils Absolute: 0.2 10*3/uL (ref 0.0–0.5)
Eosinophils Relative: 2 %
HCT: 41.5 % (ref 36.0–46.0)
Hemoglobin: 13 g/dL (ref 12.0–15.0)
Immature Granulocytes: 0 %
Lymphocytes Relative: 32 %
Lymphs Abs: 2.6 10*3/uL (ref 0.7–4.0)
MCH: 27.2 pg (ref 26.0–34.0)
MCHC: 31.3 g/dL (ref 30.0–36.0)
MCV: 86.8 fL (ref 80.0–100.0)
Monocytes Absolute: 0.4 10*3/uL (ref 0.1–1.0)
Monocytes Relative: 5 %
Neutro Abs: 4.8 10*3/uL (ref 1.7–7.7)
Neutrophils Relative %: 60 %
Platelets: 270 10*3/uL (ref 150–400)
RBC: 4.78 MIL/uL (ref 3.87–5.11)
RDW: 13.2 % (ref 11.5–15.5)
WBC: 8 10*3/uL (ref 4.0–10.5)
nRBC: 0 % (ref 0.0–0.2)

## 2018-12-08 LAB — PREGNANCY, URINE: Preg Test, Ur: NEGATIVE

## 2018-12-08 LAB — TROPONIN I (HIGH SENSITIVITY): Troponin I (High Sensitivity): 2 ng/L (ref <18)

## 2018-12-08 MED ORDER — KETOROLAC TROMETHAMINE 30 MG/ML IJ SOLN
30.0000 mg | Freq: Once | INTRAMUSCULAR | Status: AC
  Administered 2018-12-08: 30 mg via INTRAVENOUS
  Filled 2018-12-08: qty 1

## 2018-12-08 MED ORDER — DIPHENHYDRAMINE HCL 50 MG/ML IJ SOLN
12.5000 mg | Freq: Once | INTRAMUSCULAR | Status: AC
  Administered 2018-12-08: 12.5 mg via INTRAVENOUS
  Filled 2018-12-08: qty 1

## 2018-12-08 MED ORDER — METOCLOPRAMIDE HCL 5 MG/ML IJ SOLN
10.0000 mg | Freq: Once | INTRAMUSCULAR | Status: AC
  Administered 2018-12-08: 10 mg via INTRAVENOUS
  Filled 2018-12-08: qty 2

## 2018-12-08 MED ORDER — TOPIRAMATE 50 MG PO TABS: 50.0000 mg | ORAL_TABLET | Freq: Every day | ORAL | 0 refills | Status: DC

## 2018-12-08 MED FILL — TOPIRAMATE 50 MG TABLET: 50 | 30 days supply | Qty: 30 | Fill #0

## 2018-12-08 NOTE — ED Provider Notes (Signed)
[facility]ERGENCY DEPARTMENT Provider Note   CSN: [id number] Arrival date & time: 12/08/18  [id number]     History   Chief Complaint Chief Complaint  Patient presents with  . Chest Pain  . Migraine    HPI Shirley [name] is a 43 y.o. female.  She has a history of headaches since her teens and is complaining of worsening headaches over the past few months.  She used to be on Topamax but ran out of it and does not have a primary care doctor.  She is also complaining of left arm and left chest pain that worsened yesterday.  It caused her to feel short of breath.  She said she had to leave work because of the pain.  She actually says the pain is actually the left side of her body.  It is worse with movement.  Pain causes her to feel weak.  No fevers or chills no cough no vomiting or diarrhea.  No numbness.  No difficulty with speech.  No blurry vision or double vision.     The history is provided by the patient.  Chest Pain Pain location:  L chest Pain quality: stabbing   Pain radiates to:  L arm Pain severity:  Severe Onset quality:  Gradual Timing:  Intermittent Progression:  Unchanged Chronicity:  New Context: lifting, movement and raising an arm   Relieved by:  None tried Worsened by:  Movement Ineffective treatments:  None tried Associated symptoms: headache   Associated symptoms: no abdominal pain, no altered mental status, no dizziness, no fever, no nausea, no numbness, no shortness of breath, no syncope and no vomiting   Migraine This is a chronic problem. The problem occurs daily. The problem has not changed since onset.Associated symptoms include chest pain and headaches. Pertinent negatives include no abdominal pain and no shortness of breath. Nothing aggravates the symptoms. Nothing relieves the symptoms. She has tried nothing for the symptoms. The treatment provided no relief.    Past Medical History:  Diagnosis Date  . Asthma   . Hypertension   .  Migraine   . Stress   . Uterine fibroids affecting pregnancy    reports having a surgical procedure -- unsure what    Patient Active Problem List   Diagnosis Date Noted  . Upper and lower extremity pain 03/16/2016  . Lumbar radiculopathy 06/11/2015  . Headache(784.0) 06/02/2013  . Unspecified essential hypertension 06/02/2013    Past Surgical History:  Procedure Laterality Date  . MYOMECTOMY    . TONSILLECTOMY       OB History   No obstetric history on file.      Home Medications    Prior to Admission medications   Not on File    Family History Family History  Problem Relation Age of Onset  . Ataxia Neg Hx   . Chorea Neg Hx   . Dementia Neg Hx   . Mental retardation Neg Hx   . Migraines Neg Hx   . Multiple sclerosis Neg Hx   . Neurofibromatosis Neg Hx   . Neuropathy Neg Hx   . Parkinsonism Neg Hx   . Seizures Neg Hx   . Stroke Neg Hx     Social History Social History   Tobacco Use  . Smoking status: Former Smoker    Types: Cigarettes  . Smokeless tobacco: Never Used  Substance Use Topics  . Alcohol use: No  . Drug use: No     Allergies   Isovue [  iopamidol] and Latex   Review of Systems Review of Systems  Constitutional: Negative for fever.  HENT: Negative for sore throat.   Eyes: Negative for visual disturbance.  Respiratory: Negative for shortness of breath.   Cardiovascular: Positive for chest pain. Negative for syncope.  Gastrointestinal: Negative for abdominal pain, nausea and vomiting.  Genitourinary: Negative for dysuria.  Musculoskeletal: Negative for neck pain.  Skin: Negative for rash.  Neurological: Positive for headaches. Negative for dizziness, syncope, facial asymmetry, speech difficulty and numbness.     Physical Exam Updated Vital Signs BP (!) 147/108   Pulse (!) 51   Resp 17   Ht 5' 6.5" (1.689 m)   Wt 99.8 kg   SpO2 99%   BMI 34.98 kg/m   Physical Exam Vitals signs and nursing note reviewed.  Constitutional:       General: She is not in acute distress.    Appearance: She is well-developed.  HENT:     Head: Normocephalic and atraumatic.  Eyes:     Extraocular Movements: Extraocular movements intact.     Conjunctiva/sclera: Conjunctivae normal.     Pupils: Pupils are equal, round, and reactive to light.  Neck:     Musculoskeletal: Neck supple.  Cardiovascular:     Rate and Rhythm: Normal rate and regular rhythm.     Pulses: Normal pulses.     Heart sounds: No murmur.  Pulmonary:     Effort: Pulmonary effort is normal. No respiratory distress.     Breath sounds: Normal breath sounds.  Abdominal:     Palpations: Abdomen is soft.     Tenderness: There is no abdominal tenderness.  Musculoskeletal: Normal range of motion.     Right lower leg: She exhibits no tenderness.     Left lower leg: She exhibits no tenderness.     Comments: She is got tenderness through her right shoulder that reproduces some of her symptoms.  Skin:    General: Skin is warm and dry.     Capillary Refill: Capillary refill takes less than 2 seconds.  Neurological:     General: No focal deficit present.     Mental Status: She is alert and oriented to person, place, and time.     Cranial Nerves: No cranial nerve deficit.     Sensory: No sensory deficit.     Motor: No weakness.     Gait: Gait normal.      ED Treatments / Results  Labs (all labs ordered are listed, but only abnormal results are displayed) Labs Reviewed  BASIC METABOLIC PANEL - Abnormal; Notable for the following components:      Result Value   Glucose, Bld 110 (*)    Calcium 8.8 (*)    All other components within normal limits  URINALYSIS, ROUTINE W REFLEX MICROSCOPIC - Abnormal; Notable for the following components:   Specific Gravity, Urine >1.030 (*)    All other components within normal limits  CBC WITH DIFFERENTIAL/PLATELET  PREGNANCY, URINE  TROPONIN I (HIGH SENSITIVITY)    EKG EKG Interpretation  Date/Time:  Wednesday December 08 2018 06:43:37 EDT Ventricular Rate:  58 PR Interval:    QRS Duration: 94 QT Interval:  410 QTC Calculation: 403 R Axis:   62 Text Interpretation:  Sinus rhythm Low voltage, precordial leads similar to prior 11/17 Confirmed by Aletta Edouard [phone]) on 12/08/2018 7:04:50 AM   Radiology Ct Head Wo Contrast  Result Date: 12/08/2018 CLINICAL DATA:  Headache with left upper extremity weakness EXAM: CT  HEAD WITHOUT CONTRAST TECHNIQUE: Contiguous axial images were obtained from the base of the skull through the vertex without intravenous contrast. COMPARISON:  None. FINDINGS: Brain: The ventricles are normal in size and configuration. There is no intracranial mass, hemorrhage, extra-axial fluid collection, midline shift. Brain parenchyma appears unremarkable. There is benign-appearing calcification in the pineal gland region. Small calcifications along the tentorium appears benign. No acute appearing infarct. Vascular: No hyperdense vessels. No evident vascular calcifications. Skull: The bony calvarium appears intact. Sinuses/Orbits: Visualized paranasal sinuses are clear. Visualized orbits appear symmetric bilaterally. Other: Mastoid air cells are clear. IMPRESSION: Brain parenchyma appears unremarkable. No demonstrable acute infarct. No evident mass or hemorrhage. Electronically Signed   By: Lowella Grip III M.D.   On: 12/08/2018 08:24    Procedures Procedures (including critical care time)  Medications Ordered in ED Medications  metoCLOPramide (REGLAN) injection 10 mg (10 mg Intravenous Given 12/08/18 0745)  ketorolac (TORADOL) 30 MG/ML injection 30 mg (30 mg Intravenous Given 12/08/18 0745)  diphenhydrAMINE (BENADRYL) injection 12.5 mg (12.5 mg Intravenous Given 12/08/18 0744)     Initial Impression / Assessment and Plan / ED Course  I have reviewed the triage vital signs and the nursing notes.  Pertinent labs & imaging results that were available during my care of the patient were  reviewed by me and considered in my medical decision making (see chart for details).  Clinical Course as of Dec 07 1648  Wed Sep 23, 317  8412 43 year old female with chronic headaches off of her migraine medication here with complaint of headache along with left-sided body pain on and off but worse since yesterday.  Differential includes complex migraine, ACS, musculoskeletal, radiculopathy.  Very atypical story for cardiac with a benign appearing EKG.  Will check a single Trop.  Will check a head CT although nonfocal neuro exam.  Diffuse tenderness on exam although more specifically at left shoulder.  Getting some basic labs.  Will try migraine cocktail.   [MB]  0800 Reviewed patient's head CT, and I do not see any obvious infarct or bleed.  Awaiting radiology reading.   [MB]  [id number] Patient states her symptoms are improved after medications.   [MB]    Clinical Course User Index [MB] Hayden Rasmussen, MD        Final Clinical Impressions(s) / ED Diagnoses   Final diagnoses:  Migraine without status migrainosus, not intractable, unspecified migraine type  Left arm pain    ED Discharge Orders         Ordered    topiramate (TOPAMAX) 50 MG tablet  Daily     12/08/18 0836           Hayden Rasmussen, MD 12/08/18 1650

## 2018-12-08 NOTE — ED Notes (Signed)
Pt reports good results with Topamax in the past. Pt does not currently have PMD to prescribe Topamax.

## 2018-12-08 NOTE — Discharge Instructions (Signed)
You were seen in the emergency department for a migraine headache along with left-sided body pain especially at the shoulder.  You had blood work CAT scan EKG that did not show any evidence of a stroke or heart attack.  This pain is likely muscular and you should use ibuprofen 3 times a day with food.  We are also restarting you on your Topamax for your migraines.  It will be important for you to get a primary care doctor for continued management of your health care.  Please return to the emergency department if any concerns.

## 2018-12-08 NOTE — ED Triage Notes (Signed)
Pt reports chest pain and shob yesterday. While at work pt states she had difficulty using left arm followed by pain in left arm. Pt states she has had severe migraines x 4 days.

## 2018-12-10 ENCOUNTER — Emergency Department (HOSPITAL_BASED_OUTPATIENT_CLINIC_OR_DEPARTMENT_OTHER): Payer: Self-pay

## 2018-12-10 ENCOUNTER — Encounter (HOSPITAL_BASED_OUTPATIENT_CLINIC_OR_DEPARTMENT_OTHER): Payer: Self-pay | Admitting: *Deleted

## 2018-12-10 ENCOUNTER — Other Ambulatory Visit: Payer: Self-pay

## 2018-12-10 ENCOUNTER — Observation Stay (HOSPITAL_BASED_OUTPATIENT_CLINIC_OR_DEPARTMENT_OTHER)
Admission: EM | Admit: 2018-12-10 | Discharge: 2018-12-11 | Disposition: A | Discharge status: Home / Self Care | Payer: Self-pay | Attending: Internal Medicine | Admitting: Internal Medicine

## 2018-12-10 DIAGNOSIS — G43819 Other migraine, intractable, without status migrainosus: Secondary | ICD-10-CM | POA: Insufficient documentation

## 2018-12-10 DIAGNOSIS — Z7982 Long term (current) use of aspirin: Secondary | ICD-10-CM | POA: Insufficient documentation

## 2018-12-10 DIAGNOSIS — R55 Syncope and collapse: Principal | ICD-10-CM | POA: Insufficient documentation

## 2018-12-10 DIAGNOSIS — J45909 Unspecified asthma, uncomplicated: Secondary | ICD-10-CM | POA: Insufficient documentation

## 2018-12-10 DIAGNOSIS — R51 Headache: Secondary | ICD-10-CM

## 2018-12-10 DIAGNOSIS — R519 Headache, unspecified: Secondary | ICD-10-CM | POA: Diagnosis present

## 2018-12-10 DIAGNOSIS — Z79899 Other long term (current) drug therapy: Secondary | ICD-10-CM | POA: Insufficient documentation

## 2018-12-10 DIAGNOSIS — Z20828 Contact with and (suspected) exposure to other viral communicable diseases: Secondary | ICD-10-CM | POA: Insufficient documentation

## 2018-12-10 DIAGNOSIS — Z6841 Body Mass Index (BMI) 40.0 and over, adult: Secondary | ICD-10-CM | POA: Insufficient documentation

## 2018-12-10 DIAGNOSIS — G43809 Other migraine, not intractable, without status migrainosus: Secondary | ICD-10-CM

## 2018-12-10 DIAGNOSIS — I10 Essential (primary) hypertension: Secondary | ICD-10-CM | POA: Insufficient documentation

## 2018-12-10 DIAGNOSIS — G4440 Drug-induced headache, not elsewhere classified, not intractable: Secondary | ICD-10-CM | POA: Diagnosis present

## 2018-12-10 DIAGNOSIS — Z87891 Personal history of nicotine dependence: Secondary | ICD-10-CM | POA: Insufficient documentation

## 2018-12-10 LAB — CBC WITH DIFFERENTIAL/PLATELET
Abs Immature Granulocytes: 0.02 10*3/uL (ref 0.00–0.07)
Basophils Absolute: 0 10*3/uL (ref 0.0–0.1)
Basophils Relative: 0 %
Eosinophils Absolute: 0.2 10*3/uL (ref 0.0–0.5)
Eosinophils Relative: 2 %
HCT: 41.6 % (ref 36.0–46.0)
Hemoglobin: 12.9 g/dL (ref 12.0–15.0)
Immature Granulocytes: 0 %
Lymphocytes Relative: 29 %
Lymphs Abs: 2.4 10*3/uL (ref 0.7–4.0)
MCH: 27 pg (ref 26.0–34.0)
MCHC: 31 g/dL (ref 30.0–36.0)
MCV: 87.2 fL (ref 80.0–100.0)
Monocytes Absolute: 0.4 10*3/uL (ref 0.1–1.0)
Monocytes Relative: 5 %
Neutro Abs: 5.3 10*3/uL (ref 1.7–7.7)
Neutrophils Relative %: 64 %
Platelets: 274 10*3/uL (ref 150–400)
RBC: 4.77 MIL/uL (ref 3.87–5.11)
RDW: 13.1 % (ref 11.5–15.5)
WBC: 8.3 10*3/uL (ref 4.0–10.5)
nRBC: 0 % (ref 0.0–0.2)

## 2018-12-10 LAB — SARS CORONAVIRUS 2 (TAT 6-24 HRS): SARS Coronavirus 2: NEGATIVE

## 2018-12-10 LAB — BASIC METABOLIC PANEL
Anion gap: 7 (ref 5–15)
BUN: 13 mg/dL (ref 6–20)
CO2: 22 mmol/L (ref 22–32)
Calcium: 9.1 mg/dL (ref 8.9–10.3)
Chloride: 109 mmol/L (ref 98–111)
Creatinine, Ser: 0.86 mg/dL (ref 0.44–1.00)
GFR calc Af Amer: >60 mL/min (ref >60)
GFR calc non Af Amer: >60 mL/min (ref >60)
Glucose, Bld: 103 mg/dL — ABNORMAL HIGH (ref 70–99)
Potassium: 4.3 mmol/L (ref 3.5–5.1)
Sodium: 138 mmol/L (ref 135–145)

## 2018-12-10 LAB — TROPONIN I (HIGH SENSITIVITY)
Troponin I (High Sensitivity): 2 ng/L (ref <18)
Troponin I (High Sensitivity): <2 ng/L (ref <18)

## 2018-12-10 LAB — D-DIMER, QUANTITATIVE: D-Dimer, Quant: 0.53 ug/mL-FEU — ABNORMAL HIGH (ref 0.00–0.50)

## 2018-12-10 MED ORDER — ASPIRIN EC 325 MG PO TBEC
325.0000 mg | DELAYED_RELEASE_TABLET | Freq: Every day | ORAL | Status: DC
  Administered 2018-12-10 – 2018-12-11 (×2): 325 mg via ORAL
  Filled 2018-12-10 (×2): qty 1

## 2018-12-10 MED ORDER — ONDANSETRON HCL 4 MG PO TABS: 4.0000 mg | ORAL_TABLET | Freq: Four times a day (QID) | ORAL | Status: DC | PRN

## 2018-12-10 MED ORDER — METOCLOPRAMIDE HCL 5 MG/ML IJ SOLN
10.0000 mg | Freq: Once | INTRAMUSCULAR | Status: AC
  Administered 2018-12-10: 10 mg via INTRAVENOUS
  Filled 2018-12-10: qty 2

## 2018-12-10 MED ORDER — DOCUSATE SODIUM 100 MG PO CAPS
100.0000 mg | ORAL_CAPSULE | Freq: Two times a day (BID) | ORAL | Status: DC
  Administered 2018-12-11: 100 mg via ORAL
  Filled 2018-12-10 (×2): qty 1

## 2018-12-10 MED ORDER — SODIUM CHLORIDE 0.9% FLUSH
3.0000 mL | Freq: Two times a day (BID) | INTRAVENOUS | Status: DC
  Administered 2018-12-10 – 2018-12-11 (×2): 3 mL via INTRAVENOUS

## 2018-12-10 MED ORDER — ENOXAPARIN SODIUM 40 MG/0.4ML ~~LOC~~ SOLN
40.0000 mg | SUBCUTANEOUS | Status: DC
  Administered 2018-12-10: 40 mg via SUBCUTANEOUS
  Filled 2018-12-10: qty 0.4

## 2018-12-10 MED ORDER — ACETAMINOPHEN 650 MG RE SUPP: 650.0000 mg | Freq: Four times a day (QID) | RECTAL | Status: DC | PRN

## 2018-12-10 MED ORDER — TOPIRAMATE 25 MG PO TABS
50.0000 mg | ORAL_TABLET | Freq: Every day | ORAL | Status: DC
  Administered 2018-12-10 – 2018-12-11 (×2): 50 mg via ORAL
  Filled 2018-12-10 (×2): qty 2

## 2018-12-10 MED ORDER — ALBUTEROL SULFATE (2.5 MG/3ML) 0.083% IN NEBU: 3.0000 mL | INHALATION_SOLUTION | Freq: Four times a day (QID) | RESPIRATORY_TRACT | Status: DC | PRN

## 2018-12-10 MED ORDER — ONDANSETRON HCL 4 MG/2ML IJ SOLN: 4.0000 mg | Freq: Four times a day (QID) | INTRAMUSCULAR | Status: DC | PRN

## 2018-12-10 MED ORDER — KETOROLAC TROMETHAMINE 15 MG/ML IJ SOLN
15.0000 mg | Freq: Once | INTRAMUSCULAR | Status: AC
  Administered 2018-12-10: 15 mg via INTRAVENOUS
  Filled 2018-12-10: qty 1

## 2018-12-10 MED ORDER — DIPHENHYDRAMINE HCL 50 MG/ML IJ SOLN
25.0000 mg | Freq: Once | INTRAMUSCULAR | Status: AC
  Administered 2018-12-10: 25 mg via INTRAVENOUS
  Filled 2018-12-10: qty 1

## 2018-12-10 MED ORDER — INFLUENZA VAC SPLIT QUAD 0.5 ML IM SUSY: 0.5000 mL | PREFILLED_SYRINGE | INTRAMUSCULAR | Status: DC

## 2018-12-10 MED ORDER — ACETAMINOPHEN 325 MG PO TABS: 650.0000 mg | ORAL_TABLET | Freq: Four times a day (QID) | ORAL | Status: DC | PRN

## 2018-12-10 NOTE — Consult Note (Addendum)
NEURO HOSPITALIST CONSULT NOTE   Requestig physician: Dr. Karmen Bongo  Reason for Consult: Migraine  History obtained from:  Patient, Chart and Mother   HPI:                                                                                                                                          Shirley [name] is an 43 y.o. female with long history of migraines, she reports since she was a teenager. She reports being followed by Neurology in [place] and in [facility] about 2.5 years ago, however her migraines had improved, she had insurance issues and she stopped being seen by neurology. She reports that her migraines have become severe in the past several weeks with nearly 10/10 throbbing pain for 2 weeks. She develops sensitivity to light and sound with her migraine. She develops a visual change as what she describes as a water spot in her vision that comes and goes, she does not find the visual change particularly bothersome, she is mostly worried about the pain from the headache. She denies any weakness or numbness associated with the headache. She reports over the past year she has a general feeling of weakness in left side, which is not made better or worse with the migraines. She states that she develops a burning sensation in the evening in her left arm and leg, however she is still able to use with her full strength on her left. She describes it as uncomfortable for her to use her full strength on the left.   She has been using Excedrin to treat her migraines. In the past when she was followed by neurology she was on topamax. She preferred to take topamax at night because she felt it made it difficult for her to focus at work if she took topamax in the morning. She believes she has used a nasal triptan in the past, she finds it uncomfortable because it is administered in her nose, but reports it is effective. This past Tuesday she was seen at [facility] and treated for her  migraine. She is happy that the head CT at that time was unremarkable. She was prescribed Topmax 50mg , which she has been taking in the evening; she has not taken a dose yet today. She just recently got insurance and now plans to find a Primary Care Provider, she does not yet have one.   One of her primary concerns today is that spikes in her BP might be triggering her headaches. However upon further discussion with Dr. Cheral Marker, she reported that she does not consisently check her blood pressure when she has migraines versus when she does not. It was discussed that it is difficult for her physician team to know if she has underlying hypertension or whether  the HTN is triggered by or triggers her migraines without more information on her BP levels during the day, and was advised to start keeping a BP diary.   Past Medical History:  Diagnosis Date  . Asthma   . Hypertension   . Migraine   . Stress   . Uterine fibroids affecting pregnancy    reports having a surgical procedure -- unsure what    Past Surgical History:  Procedure Laterality Date  . ABDOMINAL HYSTERECTOMY    . MYOMECTOMY    . TONSILLECTOMY      Family History  Problem Relation Age of Onset  . Ataxia Neg Hx   . Chorea Neg Hx   . Dementia Neg Hx   . Mental retardation Neg Hx   . Migraines Neg Hx   . Multiple sclerosis Neg Hx   . Neurofibromatosis Neg Hx   . Neuropathy Neg Hx   . Parkinsonism Neg Hx   . Seizures Neg Hx   . Stroke Neg Hx     Social History:  reports that she quit smoking 4 days ago. Her smoking use included cigarettes. She quit after 25.00 years of use. She has never used smokeless tobacco. She reports that she does not drink alcohol or use drugs.  Allergies  Allergen Reactions  . Isovue [Iopamidol] Itching    Eye redness/swelling and itching post contrast, hives on upper back  . Latex Rash    MEDICATIONS:                                                                                                                      I have reviewed the patient's current medications.   ROS:                                                                                                                                       History obtained from the patient  General ROS: negative for - chills, fatigue, fever, weight gain or weight loss Psychological ROS: negative for - behavioral disorder, hallucinations, memory difficulties,  Ophthalmic ROS: negative for -  double vision, eye pain or loss of vision.  She does report shimmering "water like" vision changes that occur with her migraines.  ENT ROS: negative for - sore throat, tinnitus or vertigo Allergy and Immunology ROS: negative for - hives or itchy/watery eyes Respiratory ROS: negative for - cough, shortness of breath or wheezing  Cardiovascular ROS: negative for - chest pain, dyspnea on exertion, Gastrointestinal ROS: negative for - abdominal pain Genito-Urinary ROS: negative for - dysuria, urinary frequency/urgency Musculoskeletal ROS: negative for - joint swelling.  Reports a feeling of discomfort when using full strength in both her right arm and leg Neurological ROS: Otherwise as noted in HPI Dermatological ROS: negative for rash and skin lesion changes   Blood pressure 114/63, pulse (!) 57, temperature 98 F (36.7 C), temperature source Oral, resp. rate 16, height 5\' 6"  (1.676 m), weight 113.1 kg, SpO2 99 %.   General Examination:                                                                                                       Physical Exam  HEENT-  Normocephalic, no lesions, without obvious abnormality.  Normal external eye and conjunctiva.   Cardiovascular-  pulses palpable throughout   Lungs-no rhonchi or wheezing noted, no excessive working breathing.  Saturations within normal limits Extremities- Warm, dry and intact Musculoskeletal-no joint tenderness, deformity or swelling Skin-warm and dry, no hyperpigmentation, vitiligo, or  suspicious lesions  Neurological Examination Mental Status: Alert, oriented, thought content appropriate.  Speech fluent without evidence of aphasia.  Able to follow 3 step commands without difficulty. Cranial Nerves: II:  Visual fields grossly normal,  III,IV, VI: ptosis not present, extra-ocular motions intact bilaterally pupils equal, round, reactive to light and accommodation V,VII: smile symmetric, facial light touch sensation normal bilaterally VIII: hearing normal bilaterally IX,X: uvula rises symmetrically XI: bilateral shoulder shrug XII: midline tongue extension Motor: Right : Upper extremity   5/5    Left:     Upper extremity   5/5  Lower extremity   5/5     Lower extremity   5/5 Tone and bulk:normal tone throughout; no atrophy noted Sensory:  light touch intact throughout, bilaterally Deep Tendon Reflexes: 2+ and symmetric throughout Plantars: Right: downgoing   Left: downgoing Cerebellar: normal finger-to-nose, normal rapid alternating movements and normal heel-to-shin test Gait: Deferred   Lab Results: Basic Metabolic Panel: Recent Labs  Lab 12/08/18 0736 12/10/18 1124  NA 140 138  K 4.1 4.3  CL 108 109  CO2 23 22  GLUCOSE 110* 103*  BUN 16 13  CREATININE 0.96 0.86  CALCIUM 8.8* 9.1    CBC: Recent Labs  Lab 12/08/18 0736 12/10/18 1124  WBC 8.0 8.3  NEUTROABS 4.8 5.3  HGB 13.0 12.9  HCT 41.5 41.6  MCV 86.8 87.2  PLT 270 274    Cardiac Enzymes: No results for input(s): CKTOTAL, CKMB, CKMBINDEX, TROPONINI in the last 168 hours.  Lipid Panel: No results for input(s): CHOL, TRIG, HDL, CHOLHDL, VLDL, LDLCALC in the last 168 hours.  Imaging: CT Head Completed on 12/08/18 IMPRESSION: Brain parenchyma appears unremarkable. No demonstrable acute infarct. No evident mass or hemorrhage.  Assessment/ Migraine    Impression: 43 year old female with extensive migraine history. Followed in the past by Neurology and was on topmax, she reports about 2.5  years ago. Over the past 2 weeks she has experienced severe migraines reaching  10/10 with sensitivity to light and sound in addition to scintillating visual changes.  1. She had a normal head CT on 12/08/18 and was started on topamax 50mg  QHS.  2. Her neurologic exam is unremarkable.  3. She has concerns about elevated blood pressure related to her migraines, however has not checked her blood pressure when a migraine is not present to know if she has underlying hypertension.  It was discussed that it is difficult for her physician team to know if she has underlying hypertension or whether the HTN is triggered by or triggers her migraines without more information on her BP levels during the day, and was advised to start keeping a BP diary.   Recommendations: 1. Continue topamax 50mg  QHS. She reports that she does not tolerate taking it during the morning because she finds it difficult to focus at work.  2. Encouraged her to get a Primary Care Physician, especially now that she has insurance.  3. Blood pressure monitoring. Discussed the importance of checking her blood pressure regularly with and without her migraines to determine if the hypertension is due to the migraine or if it is always present. The benefits of health diet and exercise were discussed. Specifically walking regularly and finding a support person to walk with her was discussed as a possible option for exercise.  4. Discussed the benefits of neck, shoulder scalp and temporalis muscle massage for migraine prophylaxis and she verbalized understanding.  5. Discussed the importance of staying hydrated to help with migraine prevention 6. Her relationship with her husband was discussed with Dr. Cheral Marker and she became a bit tearful. A discussion occured on positive relationship strategies and how decreasing relationship stress can also help to reduce migraines.  7. Recommendation for Phenergan 25mg  IV Once Now with IV Fluids 8. The patient  reports a triptan nasal spray has been effective for her migraines in the past, and she believes she may have some at home that she has not used in a long time. It was suggested that she find a primary care physician to help her manage that medication and the refills. It was discussed that using the triptan nasal spray as directed at that start of a migraine can be very effective.   This patient was examined with Dr. Nile Dear DNP, FNP-C [facility] Nurse Practitioner   I have seen and examined the patient. I have discussed the assessment and recommendations with Gwenyth Bouillon, NP. 43 year old patient with severe migraine headache. Exam is normal. Recommendations as above.  Electronically signed: Dr. Kerney Elbe

## 2018-12-10 NOTE — ED Provider Notes (Signed)
[place] EMERGENCY DEPARTMENT Provider Note   CSN: [id number] Arrival date & time: 12/10/18  1017     History   Chief Complaint Headache Chest pain Syncope  HPI Shirley [name] is a 43 y.o. female w past medical history of hypertension and migraines who presents to the emergency department with syncope and headache.  Patient reports she had abrupt onset of headache with chest pain approximately 4 days ago.  She was seen in our ER 2 days ago on September 23, and had a cardiac work-up including EKG and troponin and chest x-rays, which showed no acute pathology, as well as CT scan of her head, which showed no acute process.  She was given a headache cocktail which she reports did improve her headache symptoms somewhat but did not completely resolve them.  She was discharged home and presents back today complaining of continued and worsening headache.  She states that this morning when she was getting out of bed, she had an episode of sudden syncope.  She denies any prodrome, including palpitations, tunnel vision, or dizziness.  She said it was like "lights out".  She reports she fell to the floor and woke up after period of seconds to minutes.  She denies any prior history of syncope.  She denies any prior history of cardiac disease.  She does report that she had blood clots in the past due to a "uterine problem" and underwent hysterectomy.  Here in the ER she continues to endorse diffuse headache.  She endorses photophobia.  She continues to describe some chest discomfort.  She denies any shortness of breath, neck stiffness, fevers or chills.  She denies any cough or congestion.  She also reports that she has had several months of left-sided weakness and pain.  She has difficulty describing the pain to me. The pain appears to be mostly localized around the left shoulder and left hip.  She states it feels like the "entire left side of my body" is in pain.  This been going on for  several months, but she feels like in the past few days, it is gotten worse.  She reports no known drug allergies. She was a former smoker but quit smoking 3 years ago.     HPI  Past Medical History:  Diagnosis Date  . Asthma   . Hypertension   . Migraine   . Stress   . Uterine fibroids affecting pregnancy    reports having a surgical procedure -- unsure what    Patient Active Problem List   Diagnosis Date Noted  . Syncope 12/10/2018  . Obesity, Class III, BMI 40-49.9 (morbid obesity) ([place]) 12/10/2018  . Upper and lower extremity pain 03/16/2016  . Lumbar radiculopathy 06/11/2015  . Headache 06/02/2013  . Essential hypertension 06/02/2013    Past Surgical History:  Procedure Laterality Date  . ABDOMINAL HYSTERECTOMY    . MYOMECTOMY    . TONSILLECTOMY       OB History   No obstetric history on file.      Home Medications    Prior to Admission medications   Medication Sig Start Date End Date Taking? Authorizing Provider  albuterol (VENTOLIN HFA) 108 (90 Base) MCG/ACT inhaler Inhale 2 puffs into the lungs every 6 (six) hours as needed for wheezing or shortness of breath.   Yes [provider]  aspirin EC 325 MG tablet Take 325 mg by mouth daily.   Yes [provider]  naproxen sodium (ALEVE) 220 MG tablet  Take 440 mg by mouth daily as needed (headaches/migraines).   Yes [provider]  topiramate (TOPAMAX) 50 MG tablet Take 1 tablet (50 mg total) by mouth daily. 12/08/18  Yes Hayden Rasmussen, MD    Family History Family History  Problem Relation Age of Onset  . Ataxia Neg Hx   . Chorea Neg Hx   . Dementia Neg Hx   . Mental retardation Neg Hx   . Migraines Neg Hx   . Multiple sclerosis Neg Hx   . Neurofibromatosis Neg Hx   . Neuropathy Neg Hx   . Parkinsonism Neg Hx   . Seizures Neg Hx   . Stroke Neg Hx     Social History Social History   Tobacco Use  . Smoking status: Former Smoker    Years: 25.00    Types: Cigarettes     Quit date: 12/06/2018    Years since quitting: 0.0  . Smokeless tobacco: Never Used  Substance Use Topics  . Alcohol use: No  . Drug use: No     Allergies   Isovue [iopamidol] and Latex   Review of Systems Review of Systems  Constitutional: Positive for fatigue. Negative for chills and fever.  Eyes: Negative for pain and visual disturbance.  Respiratory: Positive for shortness of breath. Negative for cough.   Cardiovascular: Positive for chest pain. Negative for palpitations.  Gastrointestinal: Negative for abdominal pain and vomiting.  Musculoskeletal: Positive for arthralgias, joint swelling and myalgias. Negative for back pain.  Neurological: Positive for dizziness, syncope, weakness, light-headedness, numbness and headaches.  All other systems reviewed and are negative.    Physical Exam Updated Vital Signs BP 114/63 (BP Location: Right Arm)   Pulse (!) 57   Temp 98 F (36.7 C) (Oral)   Resp 16   Ht 5\' 6"  (1.676 m)   Wt 113.1 kg   SpO2 99%   BMI 40.25 kg/m   Physical Exam Vitals signs and nursing note reviewed.  Constitutional:      General: She is not in acute distress.    Appearance: She is well-developed.     Comments: Appears comfortable in the room  HENT:     Head: Normocephalic and atraumatic.  Eyes:     Extraocular Movements: Extraocular movements intact.     Conjunctiva/sclera: Conjunctivae normal.     Pupils: Pupils are equal, round, and reactive to light.  Neck:     Musculoskeletal: Neck supple.  Cardiovascular:     Rate and Rhythm: Normal rate and regular rhythm.     Pulses: Normal pulses.  Pulmonary:     Effort: Pulmonary effort is normal. No respiratory distress.     Breath sounds: Normal breath sounds.  Abdominal:     Palpations: Abdomen is soft.     Tenderness: There is no abdominal tenderness.  Musculoskeletal: Normal range of motion.        General: No swelling or tenderness.  Skin:    General: Skin is warm and dry.   Neurological:     Mental Status: She is alert.     GCS: GCS eye subscore is 4. GCS verbal subscore is 5. GCS motor subscore is 6.     Cranial Nerves: Cranial nerves are intact.     Sensory: Sensation is intact.     Motor: Motor function is intact.     Coordination: Finger-Nose-Finger Test normal.  Psychiatric:        Mood and Affect: Mood normal.  Behavior: Behavior normal.      ED Treatments / Results  Labs (all labs ordered are listed, but only abnormal results are displayed) Labs Reviewed  D-DIMER, QUANTITATIVE (NOT AT [facility]) - Abnormal; Notable for the following components:      Result Value   D-Dimer, Quant 0.53 (*)    All other components within normal limits  BASIC METABOLIC PANEL - Abnormal; Notable for the following components:   Glucose, Bld 103 (*)    All other components within normal limits  SARS CORONAVIRUS 2 (TAT 6-24 HRS)  CBC WITH DIFFERENTIAL/PLATELET  HIV ANTIBODY (ROUTINE TESTING W REFLEX)  BASIC METABOLIC PANEL  CBC  TROPONIN I (HIGH SENSITIVITY)  TROPONIN I (HIGH SENSITIVITY)    EKG EKG Interpretation  Date/Time:  Friday December 10 2018 10:31:14 EDT Ventricular Rate:  60 PR Interval:    QRS Duration: 95 QT Interval:  407 QTC Calculation: 407 R Axis:   29 Text Interpretation:  Sinus rhythm No STEMI  Confirmed by Octaviano Glow [phone]) on 12/10/2018 10:53:08 AM   Radiology No results found.  Procedures Procedures (including critical care time)  Medications Ordered in ED Medications  influenza vac split quadrivalent PF (FLUARIX) injection 0.5 mL (has no administration in time range)  aspirin EC tablet 325 mg (325 mg Oral Given 12/10/18 1723)  topiramate (TOPAMAX) tablet 50 mg (50 mg Oral Given 12/10/18 1723)  albuterol (PROVENTIL) (2.5 MG/3ML) 0.083% nebulizer solution 3 mL (has no administration in time range)  enoxaparin (LOVENOX) injection 40 mg (has no administration in time range)  acetaminophen (TYLENOL) tablet 650 mg (has no  administration in time range)    Or  acetaminophen (TYLENOL) suppository 650 mg (has no administration in time range)  docusate sodium (COLACE) capsule 100 mg (has no administration in time range)  ondansetron (ZOFRAN) tablet 4 mg (has no administration in time range)    Or  ondansetron (ZOFRAN) injection 4 mg (has no administration in time range)  sodium chloride flush (NS) 0.9 % injection 3 mL (has no administration in time range)  metoCLOPramide (REGLAN) injection 10 mg (10 mg Intravenous Given 12/10/18 1158)  ketorolac (TORADOL) 15 MG/ML injection 15 mg (15 mg Intravenous Given 12/10/18 1154)  diphenhydrAMINE (BENADRYL) injection 25 mg (25 mg Intravenous Given 12/10/18 1155)     Initial Impression / Assessment and Plan / ED Course  I have reviewed the triage vital signs and the nursing notes.  Pertinent labs & imaging results that were available during my care of the patient were reviewed by me and considered in my medical decision making (see chart for details).  This patient is a 43 year old female with a history of migraines who presents to the emergency department with ongoing headache for 4 days, associated chest pain, and now an episode of sudden syncope this morning while getting out of bed.  We will check orthostatic vital signs, although her description of "lights out" syncope without apparent prodrome is concerning to me.  This is more just if of a cardiac arrhythmia or a pulmonary embolism.  We will check an EKG, troponin, d-dimer level.  We will check basic electrolytes.  Regarding her headache, she does not appear to be in acute distress.  She is tolerating light well.  She is conversational with me.  I have a very low suspicion for intracranial bleed or stroke.  She has no neurological status.  She had a negative CT scan only 2 days ago, I do not feel the need to repeat at this time.  I have a very low clinical suspicion for meningitis.  Likewise given her benign presentation,  at this time of a lower suspicion for carotid dissection.    As per her description of left-sided weakness and pain, it is unclear what is causing this.  Apparently this been going on for several months.  She describes proximal joint pain in her left shoulder and her left hip which seems to be the primary cause of her symptoms.  She demonstrates no actual weakness on my exam.  I do not believe this is related to her ongoing headache and her syncopal episode.  I do not believe this is a stroke or aortic dissection.  Plan to evaluate her primarily for syncope.  Clinical Course as of Dec 09 1740  Fri Dec 10, 2018  1212 Pt has hx of hives with IV contrast, will cancel CTPE, will discuss with hospitalist as the patient will need admission for syncope evaluation, and might be a candidate for V/Q study   [MT]  1212 Negative trop makes large PE less likely at this time   [MT]    Clinical Course User Index [MT] Wyvonnia Dusky, MD    Final Clinical Impressions(s) / ED Diagnoses   Final diagnoses:  Syncope, unspecified syncope type  Other migraine without status migrainosus, not intractable    ED Discharge Orders    None       Wyvonnia Dusky, MD 12/10/18 1743

## 2018-12-10 NOTE — ED Triage Notes (Signed)
Pt reports being seen here for migraine 2 days ago, never relieved, this am cont with migraine and she tried to stand up. Pt denies any cp, sob or other c/o. ekg performed while pt being triaged and shown to dr. Langston Masker by Marcie Bal, emt

## 2018-12-10 NOTE — H&P (Signed)
History and Physical    Shirley [name] [id number] DOB: 06-19-1975 DOA: 12/10/2018  PCP: Patient, No Pcp Per Consultants:  [place] - [facility] med Patient coming from:  Home - lives with husband and son; Donald Prose: Mother, [phone]  Chief Complaint: migraine  HPI: Shirley [name] is a 43 y.o. female with medical history significant of migraines and HTN presenting as an [facility] transfer.  She was seen on 9/23 for migraine with recurrent migraine with prodrome with possible syncope today.  She reports having had a migraine for 10 weeks - constant, unremitting, causing problems at work so increasing her BP to stroke level.  She has had severe pain on the left side of her body.  She had grip strength decrease, dropping things, numbness on the left.  She had bad chest pressure, like she was having a stroke.  Severe pain on her left side so that she was barely able to walk.  She has had migraines since she was a child and they are typically like this but not this severe or long-lasting.  She was previously on BP medication but they took her off because she "didn't need it anymore."  She is seeing different colors/light-dark, sometimes blurry vision, sometimes black vision.  No nausea/vomiting, although she often does have this.  She is not on Topamax (she ran out) and so has been taking Excedrin Migraine.  No light-headedness, SOB, cough, fevers.  Denies h/o of VTE.    ED Course:  [facility] transfer, per Dr. Jamse Arn:  Shirley [name] 43 yo with migraines and previous h/o PE comes in with migraines x 4 days and syncope this am, lasting a few seconds to minutes.  No prodrome.  Also has been having atypical CP x 4 days, was seen 2 days ago at [facility] for CP, ruled out and dc'd home with conservative management. Today, VS WNL, no tachycardia, sob or oxygen requirement. EKG normal. D-dimer is borderline positive at 0.53. Contrast allergy so CTA cannot be done at [facility]  Dr Langston Masker wants to admit for ECHO and tele and  possible CTA/VQ scan if warranted. Accepted for tele observation overnight.   Review of Systems: As per HPI; otherwise review of systems reviewed and negative.   Ambulatory Status:  Ambulates without assistance  Past Medical History:  Diagnosis Date  . Asthma   . Hypertension   . Migraine   . Stress   . Uterine fibroids affecting pregnancy    reports having a surgical procedure -- unsure what    Past Surgical History:  Procedure Laterality Date  . ABDOMINAL HYSTERECTOMY    . MYOMECTOMY    . TONSILLECTOMY      Social History   Socioeconomic History  . Marital status: Married    Spouse name: Not on file  . Number of children: Not on file  . Years of education: Not on file  . Highest education level: Not on file  Occupational History  . Occupation: Teacher, English as a foreign language  Social Needs  . Financial resource strain: Not on file  . Food insecurity    Worry: Not on file    Inability: Not on file  . Transportation needs    Medical: Not on file    Non-medical: Not on file  Tobacco Use  . Smoking status: Former Smoker    Years: 25.00    Types: Cigarettes    Quit date: 12/06/2018    Years since quitting: 0.0  . Smokeless tobacco: Never Used  Substance and Sexual Activity  .  Alcohol use: No  . Drug use: No  . Sexual activity: Yes    Birth control/protection: Surgical  Lifestyle  . Physical activity    Days per week: Not on file    Minutes per session: Not on file  . Stress: Not on file  Relationships  . Social Herbalist on phone: Not on file    Gets together: Not on file    Attends religious service: Not on file    Active member of club or organization: Not on file    Attends meetings of clubs or organizations: Not on file    Relationship status: Not on file  . Intimate partner violence    Fear of current or ex partner: Not on file    Emotionally abused: Not on file    Physically abused: Not on file    Forced sexual activity: Not on file  Other Topics  Concern  . Not on file  Social History Narrative  . Not on file    Allergies  Allergen Reactions  . Isovue [Iopamidol] Itching    Eye redness/swelling and itching post contrast, hives on upper back  . Latex Rash    Family History  Problem Relation Age of Onset  . Ataxia Neg Hx   . Chorea Neg Hx   . Dementia Neg Hx   . Mental retardation Neg Hx   . Migraines Neg Hx   . Multiple sclerosis Neg Hx   . Neurofibromatosis Neg Hx   . Neuropathy Neg Hx   . Parkinsonism Neg Hx   . Seizures Neg Hx   . Stroke Neg Hx     Prior to Admission medications   Medication Sig Start Date End Date Taking? Authorizing Provider  topiramate (TOPAMAX) 50 MG tablet Take 1 tablet (50 mg total) by mouth daily. 12/08/18   Hayden Rasmussen, MD    Physical Exam: Vitals:   12/10/18 1034 12/10/18 1300 12/10/18 1549 12/10/18 1555  BP:  130/70  114/63  Pulse:  (!) 50  (!) 57  Resp:  13  16  Temp:    98 F (36.7 C)  TempSrc:    Oral  SpO2:  98%  99%  Weight: 99.8 kg  113.1 kg   Height: 5' 6.5" (1.689 m)  5\' 6"  (1.676 m)      . General:  Appears calm and comfortable and is NAD . Eyes:  PERRL, EOMI, normal lids, iris . ENT:  grossly normal hearing, lips & tongue, mmm . Neck:  no LAD, masses or thyromegaly . Cardiovascular:  RRR, no m/r/g. No LE edema.  Marland Kitchen Respiratory:   CTA bilaterally with no wheezes/rales/rhonchi.  Normal respiratory effort. . Abdomen:  soft, NT, ND, NABS . Back:   normal alignment, no CVAT . Skin:  no rash or induration seen on limited exam . Musculoskeletal:  grossly normal tone BUE/BLE, good ROM, no bony abnormality . Lower extremity:  No LE edema.  Limited foot exam with no ulcerations.  2+ distal pulses. Marland Kitchen Psychiatric:  grossly normal mood and affect, speech fluent and appropriate, AOx3 . Neurologic:  CN 2-12 grossly intact, moves all extremities in coordinated fashion, sensation diminished on left arm    Radiological Exams on Admission: No results found.  EKG:  Independently reviewed.  NSR with rate 60; nonspecific ST changes with no evidence of acute ischemia   Labs on Admission: I have personally reviewed the available labs and imaging studies at the time of the admission.  Pertinent labs:   Unremarkable BMP HS troponin 2, <2 Normal CBC D-dimer 0.53 COVID pending   Assessment/Plan Principal Problem:   Headache Active Problems:   Essential hypertension   Obesity, Class III, BMI 40-49.9 (morbid obesity) ([facility])   Refractory migraine -Patient presenting with report of migraine -She reports that her symptoms are similar to with prior migraines, just more intense and more persistent -She has been out of Topamax, which she takes for migraine suppression -Her PE is unremarkable including neurologically, although she reports subjectively decreased sensation along her left UE -She is sitting in a reasonably well lighted room and is alert, appropriate, and fairly jovial -Overall low suspicion for serious condition -Will observe overnight -Did not appear to respond to 9/23 ER treatments including Toradol, Benadryl, and Reglan -Neurology consulted at patient request -Anticipate d/c to home tomorrow or when migraine is controlled  HTN -Patient with prior diagnosis of HTN, currently off medications -Her documented BP was as high as 153/91 but is currently 114/63 -Will hold treatment for now and monitor  Morbid obesity -BMI 40.25 -Weight loss should be encouraged -Outpatient PCP/bariatric medicine/bariatric surgery f/u encouraged   Note: This patient has been tested and is pending for the novel coronavirus COVID-19.    DVT prophylaxis: Lovenox Code Status:  Full  Family Communication: Mother present throughout evaluation  Disposition Plan:  Home once clinically improved Consults called: Neurology  Admission status: It is my clinical opinion that referral for OBSERVATION is reasonable and necessary in this patient based on the above  information provided. The aforementioned taken together are felt to place the patient at high risk for further clinical deterioration. However it is anticipated that the patient may be medically stable for discharge from the hospital within 24 to 48 hours.   Karmen Bongo MD [facility]   How to contact the [facility] Attending or Consulting provider [place] or covering provider during after hours [place], for this patient?  1. Check the care team in [facility] and look for a) attending/consulting TR[facility] provider listed and b) the [facility] team listed 2. Log into [email or web address] and use [place]'s universal password to access. If you do not have the password, please contact the hospital operator. 3. Locate the [facility] provider you are looking for under [facility] and page to a number that you can be directly reached. 4. If you still have difficulty reaching the provider, please page the [facility] (Director on Call) for the Hospitalists listed on amion for assistance.   12/10/2018, 5:08 PM

## 2018-12-11 LAB — CBC
HCT: 39.2 % (ref 36.0–46.0)
Hemoglobin: 12.9 g/dL (ref 12.0–15.0)
MCH: 28.1 pg (ref 26.0–34.0)
MCHC: 32.9 g/dL (ref 30.0–36.0)
MCV: 85.4 fL (ref 80.0–100.0)
Platelets: 273 10*3/uL (ref 150–400)
RBC: 4.59 MIL/uL (ref 3.87–5.11)
RDW: 13.1 % (ref 11.5–15.5)
WBC: 10.9 10*3/uL — ABNORMAL HIGH (ref 4.0–10.5)
nRBC: 0 % (ref 0.0–0.2)

## 2018-12-11 LAB — BASIC METABOLIC PANEL
Anion gap: 8 (ref 5–15)
BUN: 15 mg/dL (ref 6–20)
CO2: 24 mmol/L (ref 22–32)
Calcium: 9.1 mg/dL (ref 8.9–10.3)
Chloride: 108 mmol/L (ref 98–111)
Creatinine, Ser: 1.07 mg/dL — ABNORMAL HIGH (ref 0.44–1.00)
GFR calc Af Amer: >60 mL/min (ref >60)
GFR calc non Af Amer: >60 mL/min (ref >60)
Glucose, Bld: 103 mg/dL — ABNORMAL HIGH (ref 70–99)
Potassium: 4.1 mmol/L (ref 3.5–5.1)
Sodium: 140 mmol/L (ref 135–145)

## 2018-12-11 LAB — HIV ANTIBODY (ROUTINE TESTING W REFLEX): HIV Screen 4th Generation wRfx: NONREACTIVE

## 2018-12-11 MED ORDER — SUMATRIPTAN SUCCINATE 25 MG PO TABS
25.0000 mg | ORAL_TABLET | Freq: Once | ORAL | Status: AC
  Administered 2018-12-11: 25 mg via ORAL
  Filled 2018-12-11: qty 1

## 2018-12-11 MED ORDER — PROMETHAZINE HCL 25 MG/ML IJ SOLN
25.0000 mg | Freq: Once | INTRAMUSCULAR | Status: AC
  Administered 2018-12-11: 25 mg via INTRAVENOUS
  Filled 2018-12-11: qty 1

## 2018-12-11 MED ORDER — DICLOFENAC SODIUM 1 % TD GEL
2.0000 g | Freq: Four times a day (QID) | TRANSDERMAL | Status: DC
  Administered 2018-12-11: 12:00:00 2 g via TOPICAL
  Filled 2018-12-11: qty 100

## 2018-12-11 MED ORDER — SUMATRIPTAN 20 MG/ACT NA SOLN: 1.0000 | NASAL | 0 refills | Status: DC | PRN

## 2018-12-11 MED ORDER — DICLOFENAC SODIUM 1 % TD GEL: 2.0000 g | Freq: Four times a day (QID) | TRANSDERMAL | Status: DC

## 2018-12-11 NOTE — Discharge Instructions (Signed)
Recurrent Migraine Headache A migraine headache is very bad, throbbing pain that is usually on one side of your head. Recurrent migraines keep coming back (recurring). Talk with your doctor about what things may bring on (trigger) your migraine headaches. Follow these instructions at home: Medicines  Take over-the-counter and prescription medicines only as told by your doctor.  Do not drive or use heavy machinery while taking prescription pain medicine. Lifestyle  Do not use any products that contain nicotine or tobacco, such as cigarettes and e-cigarettes. If you need help quitting, ask your doctor.  Limit alcohol intake to no more than 1 drink a day for nonpregnant women and 2 drinks a day for men. One drink equals 12 oz of beer, 5 oz of wine, or 1 oz of hard liquor.  Get 7-9 hours of sleep each night.  Lessen any stress in your life. Ask your doctor about ways to lower your stress.  Stay at a healthy weight. Talk with your doctor if you need help losing weight.  Get regular exercise. General instructions   Keep a journal to find out if certain things bring on migraine headaches. For example, write down: ? What you eat and drink. ? How much sleep you get. ? Any change to your diet or medicines.  Lie down in a dark, quiet room when you have a migraine.  Try placing a cool towel over your head when you have a migraine.  Keep lights dim if bright lights bother you or make your migraines worse.  Keep all follow-up visits as told by your doctor. This is important. Contact a doctor if:  Medicine does not help your migraines.  Your pain keeps coming back.  You have a fever.  You have weight loss without trying. Get help right away if:  Your migraine becomes really bad and medicine does not help.  You have a stiff neck.  You have trouble seeing.  Your muscles are weak or you lose control of your muscles.  You lose your balance or have trouble walking.  You feel  like you will pass out (faint) or you pass out.  You have really bad symptoms that are different than your first symptoms.  You start having sudden, very bad headaches that last for one second or less, like a thunderclap. Summary  A migraine headache is very bad, throbbing pain that is usually on one side of your head.  Talk with your doctor about what things may bring on (trigger) your migraine headaches.  Take over-the-counter and prescription medicines only as told by your doctor.  Lie down in a dark, quiet room when you have a migraine.  Keep a journal about what you eat and drink, how much sleep you get, and any changes to your medicines. This can help you find out if certain things make you have migraine headaches. This information is not intended to replace advice given to you by your health care provider. Make sure you discuss any questions you have with your health care provider. Document Released: 12/11/2007 Document Revised: 03/06/2017 Document Reviewed: 01/25/2016 Elsevier Patient Education  2020 [employer]  

## 2018-12-11 NOTE — Discharge Summary (Addendum)
Physician Discharge Summary  Shirley [name] [id number] DOB: Apr 18, 1975 DOA: 12/10/2018  PCP: Patient, No Pcp Per  Admit date: 12/10/2018 Discharge date: 12/11/2018  Admitted From: home Discharge disposition: home   Recommendations for Outpatient Follow-Up:   1. Patient to establish with PCP-- to keep headache diary and bp log 2. Stay hydrated 3. Stress management   Discharge Diagnosis:   Principal Problem:   Headache Active Problems:   Essential hypertension   Obesity, Class III, BMI 40-49.9 (morbid obesity) ([place])    Discharge Condition: Improved.  Diet recommendation: Low sodium, heart healthy  Wound care: None.  Code status: Full.   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  She was seen on 9/23 for migraine with recurrent migraine with prodrome with possible syncope today.  She reports having had a migraine for 10 weeks - constant, unremitting, causing problems at work so increasing her BP to stroke level.  She has had severe pain on the left side of her body.  She had grip strength decrease, dropping things, numbness on the left.  She had bad chest pressure, like she was having a stroke.  Severe pain on her left side so that she was barely able to walk.  She has had migraines since she was a child and they are typically like this but not this severe or long-lasting.  She was previously on BP medication but they took her off because she "didn't need it anymore."  She is seeing different colors/light-dark, sometimes blurry vision, sometimes black vision.  No nausea/vomiting, although she often does have this.  She is not on Topamax (she ran out) and so has been taking Excedrin Migraine.  No light-headedness, SOB, cough, fevers.  Denies h/o of VTE.   Hospital Course by Problem:   Refractory migraines Neuro recommendations: Impression: 43 year old  female with extensive migraine history. Followed in the past by Neurology and was on topmax, she reports about 2.5 years ago. Over the past 2 weeks she has experienced severe migraines reaching 10/10 with sensitivity to light and sound in addition to scintillating visual changes.  1. She had a normal head CT on 12/08/18 and was started on topamax 50mg  QHS.  2. Her neurologic exam is unremarkable.  3. She has concerns about elevated blood pressure related to her migraines, however has not checked her blood pressure when a migraine is not present to know if she has underlying hypertension.  It was discussed that it is difficult for her physician team to know if she has underlying hypertension or whether the HTN is triggered by or triggers her migraines without more information on her BP levels during the day, and was advised to start keeping a BP diary.   Recommendations: 1. Continue topamax 50mg  QHS. She reports that she does not tolerate taking it during the morning because she finds it difficult to focus at work.  2. Encouraged her to get a Primary Care Physician, especially now that she has insurance.  3. Blood pressure monitoring. Discussed the importance of checking her blood pressure regularly with and without her migraines to determine if the hypertension is due to the migraine or if it is always present. The benefits of health diet and exercise were discussed. Specifically walking regularly and finding a support person to walk with her was discussed as a possible option for exercise.  4.  Discussed the benefits of neck, shoulder scalp and temporalis muscle massage for migraine prophylaxis and she verbalized understanding.  5. Discussed the importance of staying hydrated to help with migraine prevention 6. Her relationship with her husband was discussed with Dr. Cheral Marker and she became a bit tearful. A discussion occured on positive relationship strategies and how decreasing relationship stress can  also help to reduce migraines.  7. Recommendation for Phenergan 25mg  IV Once Now with IV Fluids 8. The patient reports a triptan nasal spray has been effective for her migraines in the past, and she believes she may have some at home that she has not used in a long time. It was suggested that she find a primary care physician to help her manage that medication and the refills. It was discussed that using the triptan nasal spray as directed at that start of a migraine can be very effective.   -was given imatrex x 1 and pain decreased-- patient appeared very comfortable and able to carry on conversation w/o issue but described pain as a 8/10  Morbid obesity -BMI 40.25 -Weight loss should be encouraged -Outpatient PCP/bariatric medicine/bariatric surgery f/u encouraged  Medical Consultants:   neurology   Discharge Exam:   Vitals:   12/11/18 0757   BP: 131/63   Pulse: 62   Resp: 18   Temp: 98.4 F (36.9 C)   SpO2: 100%      General exam: Appears calm and comfortable.     The results of significant diagnostics from this hospitalization (including imaging, microbiology, ancillary and laboratory) are listed below for reference.     Procedures and Diagnostic Studies:   No results found.   Labs:   Basic Metabolic Panel: Recent Labs  Lab 12/08/18 0736 12/10/18 1124 12/11/18 0322  NA 140 138 140  K 4.1 4.3 4.1  CL 108 109 108  CO2 23 22 24   GLUCOSE 110* 103* 103*  BUN 16 13 15   CREATININE 0.96 0.86 1.07*  CALCIUM 8.8* 9.1 9.1   GFR Estimated Creatinine Clearance: 86.4 mL/min (A) (by C-G formula based on SCr of 1.07 mg/dL (H)). Liver Function Tests: No results for input(s): AST, ALT, ALKPHOS, BILITOT, PROT, ALBUMIN in the last 168 hours. No results for input(s): LIPASE, AMYLASE in the last 168 hours. No results for input(s): AMMONIA in the last 168 hours. Coagulation profile No results for input(s): INR, PROTIME in the last 168 hours.  CBC: Recent Labs  Lab  12/08/18 0736 12/10/18 1124 12/11/18 0322  WBC 8.0 8.3 10.9*  NEUTROABS 4.8 5.3  --   HGB 13.0 12.9 12.9  HCT 41.5 41.6 39.2  MCV 86.8 87.2 85.4  PLT 270 274 273   Cardiac Enzymes: No results for input(s): CKTOTAL, CKMB, CKMBINDEX, TROPONINI in the last 168 hours. BNP: Invalid input(s): POCBNP CBG: No results for input(s): GLUCAP in the last 168 hours. D-Dimer Recent Labs    12/10/18 1124  DDIMER 0.53*   Hgb A1c No results for input(s): HGBA1C in the last 72 hours. Lipid Profile No results for input(s): CHOL, HDL, LDLCALC, TRIG, CHOLHDL, LDLDIRECT in the last 72 hours. Thyroid function studies No results for input(s): TSH, T4TOTAL, T3FREE, THYROIDAB in the last 72 hours.  Invalid input(s): FREET3 Anemia work up No results for input(s): VITAMINB12, FOLATE, FERRITIN, TIBC, IRON, RETICCTPCT in the last 72 hours. Microbiology Recent Results (from the past 240 hour(s))  SARS CORONAVIRUS 2 (TAT 6-24 HRS) Nasopharyngeal Nasopharyngeal Swab     Status: None   Collection Time: 12/10/18  12:23 PM   Specimen: Nasopharyngeal Swab  Result Value Ref Range Status   SARS Coronavirus 2 NEGATIVE NEGATIVE Final    Comment: (NOTE) SARS-CoV-2 target nucleic acids are NOT DETECTED. The SARS-CoV-2 RNA is generally detectable in upper and lower respiratory specimens during the acute phase of infection. Negative results do not preclude SARS-CoV-2 infection, do not rule out co-infections with other pathogens, and should not be used as the sole basis for treatment or other patient management decisions. Negative results must be combined with clinical observations, patient history, and epidemiological information. The expected result is Negative. Fact Sheet for Patients: SugarRoll.be Fact Sheet for Healthcare Providers: https://www.woods-mathews.com/ This test is not yet approved or cleared by the [place] FDA and  has been authorized for  detection and/or diagnosis of SARS-CoV-2 by FDA under an Emergency Use Authorization (EUA). This EUA will remain  in effect (meaning this test can be used) for the duration of the COVID-19 declaration under Section 56 4(b)(1) of the Act, 21 U.S.C. section 360bbb-3(b)(1), unless the authorization is terminated or revoked sooner. Performed at [facility], [place] [address] [place], [place] [address]      Discharge Instructions:   Discharge Instructions    Ambulatory referral to Orthopedic Surgery   Complete by: As directed    Diet - low sodium heart healthy   Complete by: As directed    Discharge instructions   Complete by: As directed    start keeping a BP diary as well as headache diary  benefit of neck, shoulder scalp and temporalis muscle massage for migraine prophylaxis Stay hydrated -establish with PCP-- can call number on the back of your insurance card to see who is in your network. -referral made to orthopedics for your left shoulder discomfort   Increase activity slowly   Complete by: As directed      Allergies as of 12/11/2018      Reactions   Isovue [iopamidol] Itching   Eye redness/swelling and itching post contrast, hives on upper back   Latex Rash      Medication List    TAKE these medications   albuterol 108 (90 Base) MCG/ACT inhaler Commonly known as: VENTOLIN HFA Inhale 2 puffs into the lungs every 6 (six) hours as needed for wheezing or shortness of breath.   aspirin EC 325 MG tablet Take 325 mg by mouth daily.   diclofenac sodium 1 % Gel Commonly known as: VOLTAREN Apply 2 g topically 4 (four) times daily.   naproxen sodium 220 MG tablet Commonly known as: ALEVE Take 440 mg by mouth daily as needed (headaches/migraines).   SUMAtriptan 20 MG/ACT nasal spray Commonly known as: IMITREX Place 1 spray (20 mg total) into the nose as needed for migraine or headache. May repeat in 2 hours if headache persists or recurs.   topiramate 50 MG  tablet Commonly known as: Topamax Take 1 tablet (50 mg total) by mouth daily.         Time coordinating discharge: 25 min  Signed:  Geradine Girt DO  [facility] 12/11/2018, 12:47 PM

## 2019-05-06 ENCOUNTER — Ambulatory Visit: Payer: PRIVATE HEALTH INSURANCE | Admitting: Medical

## 2019-05-06 ENCOUNTER — Other Ambulatory Visit: Payer: Self-pay

## 2019-05-06 DIAGNOSIS — Z0289 Encounter for other administrative examinations: Secondary | ICD-10-CM

## 2019-05-09 ENCOUNTER — Telehealth: Payer: Self-pay

## 2019-05-09 NOTE — Telephone Encounter (Signed)
LM requesting call back to reschedule NP appointment, patient called after hours during inclement weather.

## 2019-07-15 ENCOUNTER — Ambulatory Visit (INDEPENDENT_AMBULATORY_CARE_PROVIDER_SITE_OTHER): Payer: PRIVATE HEALTH INSURANCE | Admitting: Medical

## 2019-07-15 ENCOUNTER — Ambulatory Visit: Payer: PRIVATE HEALTH INSURANCE | Admitting: Medical

## 2019-07-15 ENCOUNTER — Other Ambulatory Visit: Payer: Self-pay

## 2019-07-15 ENCOUNTER — Encounter: Payer: Self-pay | Admitting: Medical

## 2019-07-15 VITALS — BP 136/85 | HR 69 | Temp 98.1°F | Resp 18 | Ht 66.0 in | Wt 246.1 lb

## 2019-07-15 DIAGNOSIS — M25512 Pain in left shoulder: Secondary | ICD-10-CM

## 2019-07-15 DIAGNOSIS — R030 Elevated blood-pressure reading, without diagnosis of hypertension: Secondary | ICD-10-CM

## 2019-07-15 DIAGNOSIS — G8929 Other chronic pain: Secondary | ICD-10-CM

## 2019-07-15 DIAGNOSIS — J4520 Mild intermittent asthma, uncomplicated: Secondary | ICD-10-CM

## 2019-07-15 DIAGNOSIS — G43809 Other migraine, not intractable, without status migrainosus: Secondary | ICD-10-CM

## 2019-07-15 NOTE — Patient Instructions (Signed)
For chronic left shoulder pain with reduced range of motion will get xray and refer to sports meds MD.  Can continue alleve over the counter.   Hx of htn but pretty good level today. Particular in light of nsaid use for above. But still check at home when not on med and would be best if 130/80 or less. If close or above 140/90 without nsaids then would recommend med for bp again.  For controlled rare asthma use albuterol as needed basis.  For migraine history with recent worsening as you described will go ahead and refer you to neurologist. If ha with gross motor or sensory deficits then ED evaluation or if worst ha of life. If you use standard/current meds and no relief keep in mind we can see you and offer toradol im.  Follow up 3-4 weeks or as needed. Ask you schedule for early am fasting cpe/wellness.

## 2019-07-15 NOTE — Progress Notes (Signed)
Subjective:    Patient ID: Shirley [name], female    DOB: 1976/02/10, 44 y.o.   MRN: [id number]  HPI    Pt in for first time. Pt works for [employer]. Pt does exercise 3 times a week walking, weights and cyclling.  Pt states vegitarian, no alcohol, no current smoking quit 3 months ago, 1-2 cups of coffee a day.  Pt in with some left upper arm pain. Bicep and shoulder area for about 6 months. She is not sure what is cause. She states pain appeared to be around time she was working at [employer]. 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 Pt does not work at [employer] any more.  Pt not taking anything for the pain. She does have increased pain with movement and has decreased range of motion.  No associated chest pain or associated cardiac type symptoms with shoulder/upper arm pain.  LMP-hysterectomy  Years ago.  Asthma hx.- occasional rare wheeze. Last time had to use abluterol was 3 years ago.   htn hx- in past she did. She can't remember what her med was. Pt has bp cuff at home.   Migrain ha hx. No ha presently. Pt is on topamax and uses imitrex for migraine. Recent increase migraine over past year. Pt imitrex will help but not stop completely.   Review of Systems  Constitutional: Negative for chills, fatigue and fever.  Respiratory: Negative for cough, chest tightness, shortness of breath and wheezing.   Cardiovascular: Negative for chest pain and palpitations.  Gastrointestinal: Negative for abdominal pain and nausea.  Musculoskeletal:       Shoulder pain and upper arm pain.  Neurological: Negative for dizziness and headaches.  Hematological: Negative for adenopathy. Does not bruise/bleed easily.  Psychiatric/Behavioral: Negative for behavioral problems and confusion.    Past Medical History:  Diagnosis Date  . Asthma   . Hypertension   . Migraine   . Stress   . Uterine fibroids affecting pregnancy    reports having a surgical procedure -- unsure what       Social History   Socioeconomic History  . Marital status: Married    Spouse name: Not on file  . Number of children: Not on file  . Years of education: Not on file  . Highest education level: Not on file  Occupational History  . Occupation: Teacher, English as a foreign language  Tobacco Use  . Smoking status: Former Smoker    Years: 25.00    Types: Cigarettes    Quit date: 12/06/2018    Years since quitting: 0.6  . Smokeless tobacco: Never Used  Substance and Sexual Activity  . Alcohol use: No  . Drug use: No  . Sexual activity: Yes    Birth control/protection: Surgical  Other Topics Concern  . Not on file  Social History Narrative  . Not on file   Social Determinants of Health   Financial Resource Strain:   . Difficulty of Paying Living Expenses:   Food Insecurity:   . Worried About Charity fundraiser in the Last Year:   . Arboriculturist in the Last Year:   Transportation Needs:   . Film/video editor (Medical):   Marland Kitchen Lack of Transportation (Non-Medical):   Physical Activity:   . Days of Exercise per Week:   . Minutes of Exercise per Session:   Stress:   . Feeling of Stress :   Social Connections:   . Frequency of Communication with Friends and  Family:   . Frequency of Social Gatherings with Friends and Family:   . Attends Religious Services:   . Active Member of Clubs or Organizations:   . Attends Archivist Meetings:   Marland Kitchen Marital Status:   Intimate Partner Violence:   . Fear of Current or Ex-Partner:   . Emotionally Abused:   Marland Kitchen Physically Abused:   . Sexually Abused:     Past Surgical History:  Procedure Laterality Date  . ABDOMINAL HYSTERECTOMY    . MYOMECTOMY    . TONSILLECTOMY      Family History  Problem Relation Age of Onset  . Ataxia Neg Hx   . Chorea Neg Hx   . Dementia Neg Hx   . Mental retardation Neg Hx   . Migraines Neg Hx   . Multiple sclerosis Neg Hx   . Neurofibromatosis Neg Hx   . Neuropathy Neg Hx   . Parkinsonism Neg Hx   .  Seizures Neg Hx   . Stroke Neg Hx     Allergies  Allergen Reactions  . Isovue [Iopamidol] Itching    Eye redness/swelling and itching post contrast, hives on upper back  . Latex Rash    Current Outpatient Medications on File Prior to Visit  Medication Sig Dispense Refill  . albuterol (VENTOLIN HFA) 108 (90 Base) MCG/ACT inhaler Inhale 2 puffs into the lungs every 6 (six) hours as needed for wheezing or shortness of breath.    Marland Kitchen aspirin EC 325 MG tablet Take 325 mg by mouth daily.    . diclofenac sodium (VOLTAREN) 1 % GEL Apply 2 g topically 4 (four) times daily. (Patient not taking: Reported on 07/15/2019)    . naproxen sodium (ALEVE) 220 MG tablet Take 440 mg by mouth daily as needed (headaches/migraines).    . SUMAtriptan (IMITREX) 20 MG/ACT nasal spray Place 1 spray (20 mg total) into the nose as needed for migraine or headache. May repeat in 2 hours if headache persists or recurs. 1 Inhaler 0  . topiramate (TOPAMAX) 50 MG tablet Take 1 tablet (50 mg total) by mouth daily. 30 tablet 0   No current facility-administered medications on file prior to visit.    BP 136/85 (BP Location: Right Arm, Patient Position: Sitting, Cuff Size: Large)   Pulse 69   Temp 98.1 F (36.7 C) (Temporal)   Resp 18   Ht 5\' 6"  (1.676 m)   Wt 246 lb 1.6 oz (111.6 kg)   SpO2 99%   BMI 39.72 kg/m       Objective:   Physical Exam  General- No acute distress. Pleasant patient. Neck- Full range of motion, no jvd Lungs- Clear, even and unlabored. Heart- regular rate and rhythm. Neurologic- CNII- XII grossly intact.  Left shoulder- anterior aspect tenderness. Limited abduction. No crepitus on range of motion.     Assessment & Plan:  For chronic left shoulder pain with reduced range of motion will get xray and refer to sports meds MD.  Can continue alleve over the counter.   Hx of htn but pretty good level today. Particular in light of nsaid use for above. But still check at home when not on med  and would be best if 130/80 or less. If close or above 140/90 without nsaids then would recommend med for bp again.  For controlled rare asthma use albuterol as needed basis.  For migraine history with recent worsening as you described will go ahead and refer you to neurologist. If ha with gross  motor or sensory deficits then ED evaluation or if worst ha of life. If you use standard/current meds and no relief keep in mind we can see you and offer toradol im.  Follow up 3-4 weeks or as needed. Ask you schedule for early am fasting cpe/wellness.  Time spent with new  patient today was  30  minutes which consisted of discussing diagnoses, work up, treatments and documentation.  Mackie Pai, PA-C

## 2019-08-19 ENCOUNTER — Encounter: Payer: PRIVATE HEALTH INSURANCE | Admitting: Medical

## 2019-09-06 ENCOUNTER — Ambulatory Visit (INDEPENDENT_AMBULATORY_CARE_PROVIDER_SITE_OTHER): Payer: PRIVATE HEALTH INSURANCE | Admitting: Medical

## 2019-09-06 ENCOUNTER — Other Ambulatory Visit: Payer: Self-pay

## 2019-09-06 VITALS — BP 127/77 | HR 64 | Resp 18 | Ht 66.0 in | Wt 251.6 lb

## 2019-09-06 DIAGNOSIS — G43809 Other migraine, not intractable, without status migrainosus: Secondary | ICD-10-CM | POA: Diagnosis not present

## 2019-09-06 MED ORDER — BUTALBITAL-APAP-CAFFEINE 50-300-40 MG PO CAPS: ORAL_CAPSULE | ORAL | 0 refills | Status: DC

## 2019-09-06 NOTE — Progress Notes (Signed)
Subjective:    Patient ID: Shirley [name], female    DOB: Aug 15, 1975, 44 y.o.   MRN: [id number]  HPI  Pt has had ha for 4 days. She had ha daily for 3 months. Ha are light and sound sensitive. HA begin at beginning of the day. By end of day ha severe.  In the past she used topamax and imitrex.  Ha for 18 years.  I saw pt in April and put in referral to neurologist.  Neurologist office tried to contact patient. But got application error recording. Then later they left message. Pt never heard from them?  Pt declines toradol today since she had experience where toradol worsened ha after briefly helping.  Moderate level ha now but no gross motor or sensory function deficits.          Review of Systems  Constitutional: Negative for chills, fatigue and fever.  Respiratory: Negative for cough, chest tightness, shortness of breath and wheezing.   Cardiovascular: Negative for chest pain and palpitations.  Gastrointestinal: Negative for abdominal pain.  Musculoskeletal: Negative for back pain.  Skin: Negative for rash.  Neurological:       See hpi.   Hematological: Negative for adenopathy.  Psychiatric/Behavioral: Negative for behavioral problems and confusion.    Past Medical History:  Diagnosis Date  . Asthma   . Hypertension   . Migraine   . Stress   . Uterine fibroids affecting pregnancy    reports having a surgical procedure -- unsure what     Social History   Socioeconomic History  . Marital status: Single    Spouse name: Not on file  . Number of children: Not on file  . Years of education: Not on file  . Highest education level: Not on file  Occupational History  . Occupation: Teacher, English as a foreign language  Tobacco Use  . Smoking status: Former Smoker    Years: 25.00    Types: Cigarettes    Quit date: 12/06/2018    Years since quitting: 0.7  . Smokeless tobacco: Never Used  Vaping Use  . Vaping Use: Never used  Substance and Sexual Activity  . Alcohol use:  No  . Drug use: No  . Sexual activity: Yes    Birth control/protection: Surgical  Other Topics Concern  . Not on file  Social History Narrative  . Not on file   Social Determinants of Health   Financial Resource Strain:   . Difficulty of Paying Living Expenses:   Food Insecurity:   . Worried About Charity fundraiser in the Last Year:   . Arboriculturist in the Last Year:   Transportation Needs:   . Film/video editor (Medical):   Marland Kitchen Lack of Transportation (Non-Medical):   Physical Activity:   . Days of Exercise per Week:   . Minutes of Exercise per Session:   Stress:   . Feeling of Stress :   Social Connections:   . Frequency of Communication with Friends and Family:   . Frequency of Social Gatherings with Friends and Family:   . Attends Religious Services:   . Active Member of Clubs or Organizations:   . Attends Archivist Meetings:   Marland Kitchen Marital Status:   Intimate Partner Violence:   . Fear of Current or Ex-Partner:   . Emotionally Abused:   Marland Kitchen Physically Abused:   . Sexually Abused:     Past Surgical History:  Procedure Laterality Date  . ABDOMINAL HYSTERECTOMY    .  ABDOMINAL HYSTERECTOMY    . MYOMECTOMY    . TONSILLECTOMY      Family History  Problem Relation Age of Onset  . Ataxia Neg Hx   . Chorea Neg Hx   . Dementia Neg Hx   . Mental retardation Neg Hx   . Migraines Neg Hx   . Multiple sclerosis Neg Hx   . Neurofibromatosis Neg Hx   . Neuropathy Neg Hx   . Parkinsonism Neg Hx   . Seizures Neg Hx   . Stroke Neg Hx     Allergies  Allergen Reactions  . Isovue [Iopamidol] Itching    Eye redness/swelling and itching post contrast, hives on upper back  . Latex Rash    Current Outpatient Medications on File Prior to Visit  Medication Sig Dispense Refill  . albuterol (VENTOLIN HFA) 108 (90 Base) MCG/ACT inhaler Inhale 2 puffs into the lungs every 6 (six) hours as needed for wheezing or shortness of breath. (Patient not taking: Reported  on 09/06/2019)    . aspirin EC 325 MG tablet Take 325 mg by mouth daily. (Patient not taking: Reported on 09/06/2019)    . diclofenac sodium (VOLTAREN) 1 % GEL Apply 2 g topically 4 (four) times daily. (Patient not taking: Reported on 07/15/2019)    . naproxen sodium (ALEVE) 220 MG tablet Take 440 mg by mouth daily as needed (headaches/migraines). (Patient not taking: Reported on 09/06/2019)    . SUMAtriptan (IMITREX) 20 MG/ACT nasal spray Place 1 spray (20 mg total) into the nose as needed for migraine or headache. May repeat in 2 hours if headache persists or recurs. 1 Inhaler 0  . topiramate (TOPAMAX) 50 MG tablet Take 1 tablet (50 mg total) by mouth daily. (Patient not taking: Reported on 09/06/2019) 30 tablet 0   No current facility-administered medications on file prior to visit.    BP 127/77 (BP Location: Right Arm, Patient Position: Sitting, Cuff Size: Large)   Pulse 64   Resp 18   Ht 5\' 6"  (1.676 m)   Wt 251 lb 9.6 oz (114.1 kg)   SpO2 93%   BMI 40.61 kg/m      Objective:   Physical Exam  General Mental Status- Alert. General Appearance- Not in acute distress.   Skin General: Color- Normal Color. Moisture- Normal Moisture.  Neck Carotid Arteries- Normal color. Moisture- Normal Moisture. No carotid bruits. No JVD.  Chest and Lung Exam Auscultation: Breath Sounds:-Normal.  Cardiovascular Auscultation:Rythm- Regular. Murmurs & Other Heart Sounds:Auscultation of the heart reveals- No Murmurs.  Abdomen Inspection:-Inspeection Normal. Palpation/Percussion:Note:No mass. Palpation and Percussion of the abdomen reveal- Non Tender, Non Distended + BS, no rebound or guarding.    Neurologic Cranial Nerve exam:- CN III-XII intact(No nystagmus), symmetric smile. Drift Test:- No drift. Finger to Nose:- Normal/Intact Strength:- 5/5 equal and symmetric strength both upper and lower extremities.      Assessment & Plan:  For your history of worsening migraine headaches with  most recent headache being 4 days, I prescribed you Fioricet to use 1 tablet every 6 hours as needed migraine headache.  Give me an update whether or not this stops current headache by tomorrow.  Toradol injection declined today.  Previously used Imitrex and Topamax but failed that treatment.  Good neurologic exam today.  If headache persist with worsening signs/symptoms then have to recommend ED evaluation.  I do want you to go ahead and call Dr. Jaynee Eagles  office and asked to be scheduled for appointment.  The number to their  office is [phone].  You can note that referral in epic is available to review.  Follow-up in 7 days or as needed.  Time spent with patient today was 20  minutes which consisted of chart review, discussing diagnosis, work up,  treatment and documentation.

## 2019-09-06 NOTE — Patient Instructions (Signed)
For your history of worsening migraine headaches with most recent headache being 4 days, I prescribed you Fioricet to use 1 tablet every 6 hours as needed migraine headache.  Give me an update whether or not this stops current headache by tomorrow.  Toradol injection declined today.  Previously used Imitrex and Topamax but failed that treatment.  Good neurologic exam today.  If headache persist with worsening signs/symptoms then have to recommend ED evaluation.  I do want you to go ahead and call Dr. Jaynee Eagles  office and asked to be scheduled for appointment.  The number to their office is [phone].  You can note that referral in epic is available to review.  Follow-up in 7 days or as needed.

## 2019-10-20 ENCOUNTER — Other Ambulatory Visit: Payer: Self-pay

## 2019-10-21 ENCOUNTER — Other Ambulatory Visit: Payer: Self-pay | Admitting: Medical

## 2019-10-21 ENCOUNTER — Ambulatory Visit (INDEPENDENT_AMBULATORY_CARE_PROVIDER_SITE_OTHER): Payer: PRIVATE HEALTH INSURANCE | Admitting: Nurse Practitioner

## 2019-10-21 ENCOUNTER — Encounter: Payer: Self-pay | Admitting: Nurse Practitioner

## 2019-10-21 DIAGNOSIS — S134XXA Sprain of ligaments of cervical spine, initial encounter: Secondary | ICD-10-CM

## 2019-10-21 MED ORDER — IBUPROFEN 600 MG PO TABS: 600.0000 mg | ORAL_TABLET | Freq: Three times a day (TID) | ORAL | 0 refills | Status: DC

## 2019-10-21 MED ORDER — METHOCARBAMOL 500 MG PO TABS: 500.0000 mg | ORAL_TABLET | Freq: Three times a day (TID) | ORAL | 0 refills | Status: DC | PRN

## 2019-10-21 NOTE — Patient Instructions (Signed)
Cervical Sprain  A cervical sprain is a stretch or tear in the tissues that connect bones (ligaments) in the neck. Most neck (cervical) sprains get better in 4-6 weeks. Follow these instructions at home: If you have a neck collar:  Wear it as told by your doctor. Do not take off (do not remove) the collar unless your doctor says that this is safe.  Ask your doctor before adjusting your collar.  If you have long hair, keep it outside of the collar.  Ask your doctor if you may take off the collar for cleaning and bathing. If you may take off the collar: ? Follow instructions from your doctor about how to take off the collar safely. ? Clean the collar by wiping it with mild soap and water. Let it air-dry all the way. ? If your collar has removable pads:  Take the pads out every 1-2 days.  Hand wash the pads with soap and water.  Let the pads air-dry all the way before you put them back in the collar. Do not dry them in a clothes dryer. Do not dry them with a hair dryer. ? Check your skin under the collar for irritation or sores. If you see any, tell your doctor. Managing pain, stiffness, and swelling   Use a cervical traction device, if told by your doctor.  If told, put heat on the affected area. Do this before exercises (physical therapy) or as often as told by your doctor. Use the heat source that your doctor recommends, such as a moist heat pack or a heating pad. ? Place a towel between your skin and the heat source. ? Leave the heat on for 20-30 minutes. ? Take the heat off (remove the heat) if your skin turns bright red. This is very important if you cannot feel pain, heat, or cold. You may have a greater risk of getting burned.  Put ice on the affected area. ? Put ice in a plastic bag. ? Place a towel between your skin and the bag. ? Leave the ice on for 20 minutes, 2-3 times a day. Activity  Do not drive while wearing a neck collar. If you do not have a neck collar, ask  your doctor if it is safe to drive.  Do not drive or use heavy machinery while taking prescription pain medicine or muscle relaxants, unless your doctor approves.  Do not lift anything that is heavier than 10 lb (4.5 kg) until your doctor tells you that it is safe.  Rest as told by your doctor.  Avoid activities that make you feel worse. Ask your doctor what activities are safe for you.  Do exercises as told by your doctor or physical therapist. Preventing neck sprain  Practice good posture. Adjust your workstation to help with this, if needed.  Exercise regularly as told by your doctor or physical therapist.  Avoid activities that are risky or may cause a neck sprain (cervical sprain). General instructions  Take over-the-counter and prescription medicines only as told by your doctor.  Do not use any products that contain nicotine or tobacco. This includes cigarettes and e-cigarettes. If you need help quitting, ask your doctor.  Keep all follow-up visits as told by your doctor. This is important. Contact a doctor if:  You have pain or other symptoms that get worse.  You have symptoms that do not get better after 2 weeks.  You have pain that does not get better with medicine.  You start to   have new, unexplained symptoms.  You have sores or irritated skin from wearing your neck collar. Get help right away if:  You have very bad pain.  You have any of the following in any part of your body: ? Loss of feeling (numbness). ? Tingling. ? Weakness.  You cannot move a part of your body (you have paralysis).  Your activity level does not improve. Summary  A cervical sprain is a stretch or tear in the tissues that connect bones (ligaments) in the neck.  If you have a neck (cervical) collar, do not take off the collar unless your doctor says that this is safe.  Put ice on affected areas as told by your doctor.  Put heat on affected areas as told by your doctor.  Good  posture and regular exercise can help prevent a neck sprain from happening again. This information is not intended to replace advice given to you by your health care provider. Make sure you discuss any questions you have with your health care provider. Document Revised: 06/23/2018 Document Reviewed: 11/13/2015 Elsevier Patient Education  2020 Elsevier Inc.  
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
## 2019-10-24 MED ORDER — BUTALBITAL-APAP-CAFFEINE 50-300-40 MG PO CAPS: ORAL_CAPSULE | ORAL | 0 refills | Status: DC

## 2019-10-24 NOTE — Telephone Encounter (Signed)
Fiorcet sent to pt pharmacy. She needs to have appointment for any further refills. Also needs to get scheduled with Dr. Jaynee Eagles neurologist. Has she called and got scheduled?

## 2019-10-24 NOTE — Telephone Encounter (Signed)
Pt called and lvm to return call 

## 2019-10-31 ENCOUNTER — Ambulatory Visit: Payer: PRIVATE HEALTH INSURANCE | Admitting: Medical

## 2019-11-04 ENCOUNTER — Ambulatory Visit (INDEPENDENT_AMBULATORY_CARE_PROVIDER_SITE_OTHER): Payer: BC Managed Care – PPO | Admitting: Medical

## 2019-11-04 ENCOUNTER — Ambulatory Visit (HOSPITAL_BASED_OUTPATIENT_CLINIC_OR_DEPARTMENT_OTHER)
Admission: RE | Admit: 2019-11-04 | Discharge: 2019-11-04 | Disposition: A | Discharge status: Home / Self Care | Payer: BC Managed Care – PPO | Source: Ambulatory Visit | Attending: Medical | Admitting: Medical

## 2019-11-04 ENCOUNTER — Other Ambulatory Visit: Payer: Self-pay

## 2019-11-04 VITALS — BP 138/88 | HR 77 | Resp 18 | Ht 66.0 in | Wt 245.0 lb

## 2019-11-04 DIAGNOSIS — M898X1 Other specified disorders of bone, shoulder: Secondary | ICD-10-CM | POA: Insufficient documentation

## 2019-11-04 DIAGNOSIS — S4991XA Unspecified injury of right shoulder and upper arm, initial encounter: Secondary | ICD-10-CM | POA: Diagnosis not present

## 2019-11-04 DIAGNOSIS — S4992XA Unspecified injury of left shoulder and upper arm, initial encounter: Secondary | ICD-10-CM | POA: Diagnosis not present

## 2019-11-04 NOTE — Patient Instructions (Addendum)
For your low back pain recommend follow plan advised by your orthopedist. Sounds like getting better gradually. Caution on gabapentin and sedation. Also caution on robaxin sedation. Not to use sedating meds at work. Continue nsaid orthopedist prescribed.  Will get xray of both scapula since you do have pain in both areas. Sometimes injuries can be missed with severe distracting injuries early on.  Follow 10 days or as needed
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
## 2019-11-22 ENCOUNTER — Encounter: Payer: Self-pay | Admitting: Medical

## 2019-11-22 ENCOUNTER — Telehealth: Payer: Self-pay | Admitting: Medical

## 2019-11-22 ENCOUNTER — Other Ambulatory Visit: Payer: Self-pay

## 2019-11-22 ENCOUNTER — Ambulatory Visit (INDEPENDENT_AMBULATORY_CARE_PROVIDER_SITE_OTHER): Payer: BC Managed Care – PPO | Admitting: Medical

## 2019-11-22 VITALS — BP 129/68 | HR 71 | Temp 98.9°F | Resp 18 | Ht 66.0 in | Wt 242.8 lb

## 2019-11-22 DIAGNOSIS — M5441 Lumbago with sciatica, right side: Secondary | ICD-10-CM | POA: Diagnosis not present

## 2019-11-22 DIAGNOSIS — M542 Cervicalgia: Secondary | ICD-10-CM | POA: Diagnosis not present

## 2019-11-22 NOTE — Patient Instructions (Addendum)
For neck pain and back pain want you to follow thru with work up ordered by orthopedist. If you can have them send copies of imaging reports.  Continue frotek/ketoprofen 100 mg q day as ortho rx. metaxolone 800 mg every 12 hours and gabapentin titration per ortho. Send me update by my chart on gabapentin instruction.  Follow work restriction guidelines by orthopedist.  Follow up with [place] as needed post ortho appointment.  Since specialist involved would defer all paperwork to them. Copies of our records can be made available at your request.

## 2019-11-22 NOTE — Progress Notes (Signed)
Subjective:    Patient ID: Shirley [name], female    DOB: 1975/09/28, 44 y.o.   MRN: [id number]  HPI  Pt in for follow up.  Pt states she did talk with specialist. Specialist did clear her to go back to work.  But she had recurrent pain.  Pt states her specialist did take her off work.   Pt states feels like can't work presently.  Pt states on return to work she started to have rt side neck pain and lower back area pain. Pt states she had return to work for 4 days before pain became issue. Pt having some pain shooting down her rt arm. Pt states specialist scheduled her for mri of her neck. Pt has been seeing orthopedic/spine specialist at [facility].  Pt also states she has some lower back area pain. Orthopedist has done xray of her lower back. She states the other day/last Monday when she got off work had some radicular pain to left and rt lower ext. Rt foot was number for about 20 minutes.  Pt works at [employer] in [place].  Pt has been on gabapentin and muscle relaxant that specialiast has given. Also pt is on nsaid that specialist has prescribed.  Pt sees specialist next week.  Pt pain level was 4/10 at beginning of her shift but reached 10/10 after about 4 hours. Pt states no light duty available.  Pt states both neck pain and back pain equally severe.  Review of Systems  Constitutional: Negative for chills and fatigue.  Respiratory: Negative for cough, shortness of breath and wheezing.   Cardiovascular: Negative for chest pain and palpitations.  Gastrointestinal: Negative for abdominal pain, blood in stool and nausea.  Musculoskeletal: Positive for back pain and neck pain. Negative for myalgias.       See hpi.  Skin: Negative for rash.  Neurological: Negative for dizziness and headaches.       See hpi.  Hematological: Negative for adenopathy. Does not bruise/bleed easily.  Psychiatric/Behavioral: Negative for behavioral problems, confusion, sleep disturbance  and suicidal ideas. The patient is not nervous/anxious.     Past Medical History:  Diagnosis Date   Asthma    Hypertension    Migraine    Stress    Uterine fibroids affecting pregnancy    reports having a surgical procedure -- unsure what     Social History   Socioeconomic History   Marital status: Single    Spouse name: Not on file   Number of children: Not on file   Years of education: Not on file   Highest education level: Not on file  Occupational History   Occupation: Teacher, English as a foreign language  Tobacco Use   Smoking status: Former Smoker    Years: 25.00    Types: Cigarettes    Quit date: 12/06/2018    Years since quitting: 0.9   Smokeless tobacco: Never Used  Vaping Use   Vaping Use: Never used  Substance and Sexual Activity   Alcohol use: No   Drug use: No   Sexual activity: Yes    Birth control/protection: Surgical  Other Topics Concern   Not on file  Social History Narrative   Not on file   Social Determinants of Health   Financial Resource Strain:    Difficulty of Paying Living Expenses: Not on file  Food Insecurity:    Worried About [place] in the Last Year: Not on file   [place] in the Last Year:  Not on file  Transportation Needs:    Lack of Transportation (Medical): Not on file   Lack of Transportation (Non-Medical): Not on file  Physical Activity:    Days of Exercise per Week: Not on file   Minutes of Exercise per Session: Not on file  Stress:    Feeling of Stress : Not on file  Social Connections:    Frequency of Communication with Friends and Family: Not on file   Frequency of Social Gatherings with Friends and Family: Not on file   Attends Religious Services: Not on file   Active Member of Clubs or Organizations: Not on file   Attends Archivist Meetings: Not on file   Marital Status: Not on file  Intimate Partner Violence:    Fear of Current or Ex-Partner: Not on file    Emotionally Abused: Not on file   Physically Abused: Not on file   Sexually Abused: Not on file    Past Surgical History:  Procedure Laterality Date   ABDOMINAL HYSTERECTOMY     ABDOMINAL HYSTERECTOMY     MYOMECTOMY     TONSILLECTOMY      Family History  Problem Relation Age of Onset   Ataxia Neg Hx    [place] Neg Hx    Dementia Neg Hx    Mental retardation Neg Hx    Migraines Neg Hx    Multiple sclerosis Neg Hx    Neurofibromatosis Neg Hx    Neuropathy Neg Hx    Parkinsonism Neg Hx    Seizures Neg Hx    Stroke Neg Hx     Allergies  Allergen Reactions   Isovue [Iopamidol] Itching    Eye redness/swelling and itching post contrast, hives on upper back   Latex Rash    Current Outpatient Medications on File Prior to Visit  Medication Sig Dispense Refill   Butalbital-APAP-Caffeine (FIORICET) 50-300-40 MG CAPS 1 tab po q 6 hours prn migraine headache 6 capsule 0   No current facility-administered medications on file prior to visit.    BP 129/68    Pulse 71    Temp 98.9 F (37.2 C) (Oral)    Resp 18    Ht 5\' 6"  (1.676 m)    Wt 242 lb 12.8 oz (110.1 kg)    SpO2 100%    BMI 39.19 kg/m       Objective:   Physical Exam  General Mental Status- Alert. General Appearance- Not in acute distress.   Skin General: Color- Normal Color. Moisture- Normal Moisture.  Neck Faint mid cspine tenderness. Rt trapezius pain on palpation.  Chest and Lung Exam Auscultation: Breath Sounds:-Normal.  Cardiovascular Auscultation:Rythm- Regular. Murmurs & Other Heart Sounds:Auscultation of the heart reveals- No Murmurs.  Abdomen Inspection:-Inspeection Normal. Palpation/Percussion:Note:No mass. Palpation and Percussion of the abdomen reveal- Non Tender, Non Distended + BS, no rebound or guarding.   Neurologic Cranial Nerve exam:- CN III-XII intact(No nystagmus), symmetric smile. Strength:- 5/5 equal and symmetric strength both upper and lower  extremities.   Back Mid lumbar spine tenderness to palpation. Rt si tenderness Mild pain on straight leg lift left side. Rt side pain moderate on leg lift. Pain on lateral movements and flexion/extension of the spine.  Lower ext neurologic  L5-S1 sensation intact bilaterally. Normal patellar reflexes bilaterally. No foot drop bilaterally.  Upper and lower ext strength. 5/5 symmetric and bilateral. When does extension of legs lumbar back region pain.    Assessment & Plan:   For neck pain and back  pain want you to follow thru with work up ordered by orthopedist. If you can have them send copies of imaging reports.  Continue frotek/ketoprofen 100 mg q day as ortho rx. metaxolone 800 mg every 12 hours and gabapentin titration per ortho. Send me update by my chart on gabapentin instruction.  Follow work restriction guidelines by orthopedist.  Follow up with [place] as needed post ortho appointment.  Since specialist involved would defer all paperwork to them. Copies of our records can be made available at your request.   Mackie Pai, PA-C   Time spent with patient today was 34  minutes which consisted of chart review, discussing diagnosis, work up,  treatment and documentation.

## 2019-11-22 NOTE — Telephone Encounter (Signed)
Ok to refill 

## 2019-11-22 NOTE — Telephone Encounter (Signed)
Opened to review 

## 2019-11-24 ENCOUNTER — Telehealth: Payer: Self-pay | Admitting: Medical

## 2019-11-24 MED ORDER — BUTALBITAL-APAP-CAFFEINE 50-300-40 MG PO CAPS: ORAL_CAPSULE | ORAL | 0 refills | Status: DC

## 2019-11-24 NOTE — Telephone Encounter (Signed)
Refill butalbital sent for migraine has.

## 2019-12-22 ENCOUNTER — Telehealth: Payer: Self-pay

## 2019-12-22 NOTE — Telephone Encounter (Signed)
Forms from [facility] sent to office today needing records from 03/2018 to present, form was faxed to medical records. Original placed in desk.

## 2020-01-01 ENCOUNTER — Other Ambulatory Visit: Payer: Self-pay

## 2020-01-01 ENCOUNTER — Emergency Department (HOSPITAL_BASED_OUTPATIENT_CLINIC_OR_DEPARTMENT_OTHER)
Admission: EM | Admit: 2020-01-01 | Discharge: 2020-01-01 | Disposition: A | Discharge status: Home / Self Care | Payer: BC Managed Care – PPO | Attending: Emergency Medicine | Admitting: Emergency Medicine

## 2020-01-01 ENCOUNTER — Emergency Department (HOSPITAL_BASED_OUTPATIENT_CLINIC_OR_DEPARTMENT_OTHER): Payer: BC Managed Care – PPO

## 2020-01-01 ENCOUNTER — Encounter (HOSPITAL_BASED_OUTPATIENT_CLINIC_OR_DEPARTMENT_OTHER): Payer: Self-pay | Admitting: Emergency Medicine

## 2020-01-01 DIAGNOSIS — Y9389 Activity, other specified: Secondary | ICD-10-CM | POA: Insufficient documentation

## 2020-01-01 DIAGNOSIS — I10 Essential (primary) hypertension: Secondary | ICD-10-CM | POA: Insufficient documentation

## 2020-01-01 DIAGNOSIS — Y92410 Unspecified street and highway as the place of occurrence of the external cause: Secondary | ICD-10-CM | POA: Diagnosis not present

## 2020-01-01 DIAGNOSIS — S39012A Strain of muscle, fascia and tendon of lower back, initial encounter: Secondary | ICD-10-CM | POA: Diagnosis not present

## 2020-01-01 DIAGNOSIS — M545 Low back pain, unspecified: Secondary | ICD-10-CM | POA: Diagnosis not present

## 2020-01-01 DIAGNOSIS — Z91040 Latex allergy status: Secondary | ICD-10-CM | POA: Diagnosis not present

## 2020-01-01 DIAGNOSIS — J45909 Unspecified asthma, uncomplicated: Secondary | ICD-10-CM | POA: Insufficient documentation

## 2020-01-01 DIAGNOSIS — S3992XA Unspecified injury of lower back, initial encounter: Secondary | ICD-10-CM | POA: Diagnosis not present

## 2020-01-01 HISTORY — DX: Low back pain, unspecified: M54.50

## 2020-01-01 MED ORDER — HYDROCODONE-ACETAMINOPHEN 5-325 MG PO TABS
1.0000 | ORAL_TABLET | Freq: Once | ORAL | Status: AC
  Administered 2020-01-01: 1 via ORAL
  Filled 2020-01-01: qty 1

## 2020-01-01 MED ORDER — HYDROCODONE-ACETAMINOPHEN 5-325 MG PO TABS: 1.0000 | ORAL_TABLET | Freq: Four times a day (QID) | ORAL | 0 refills | Status: DC | PRN
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
## 2020-01-01 NOTE — Discharge Instructions (Signed)
Take the pain medications as prescribed. Follow-up with your primary care provider and continue your other medications. Return to the ER for worsening pain, injuries or falls, losing control of your bowels or bladder or chest pain.
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
## 2020-01-04 ENCOUNTER — Encounter: Payer: Self-pay | Admitting: Medical

## 2020-01-04 ENCOUNTER — Ambulatory Visit (INDEPENDENT_AMBULATORY_CARE_PROVIDER_SITE_OTHER): Payer: BC Managed Care – PPO | Admitting: Medical

## 2020-01-04 ENCOUNTER — Other Ambulatory Visit: Payer: Self-pay

## 2020-01-04 VITALS — BP 134/80 | HR 59 | Resp 18 | Ht 66.0 in | Wt 241.6 lb

## 2020-01-04 DIAGNOSIS — M542 Cervicalgia: Secondary | ICD-10-CM | POA: Diagnosis not present

## 2020-01-04 DIAGNOSIS — M5441 Lumbago with sciatica, right side: Secondary | ICD-10-CM | POA: Diagnosis not present

## 2020-01-04 MED ORDER — DICLOFENAC SODIUM 75 MG PO TBEC: 75.0000 mg | DELAYED_RELEASE_TABLET | Freq: Two times a day (BID) | ORAL | 0 refills | Status: DC
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
## 2020-01-04 NOTE — Patient Instructions (Signed)
You describe recent moderate to severe lumbar back pain and cervical spine/neck area pain.  Minimal improvement since Norco was added.  Currently on gabapentin, tinazadine and intermittent use of Norco.  Not currently on NSAID and do want to see how you respond to diclofenac.  If no marked getting some improvement with diclofenac then would consider discontinuing diclofenac and adding taper dose of prednisone.  When asked that you send me a MyChart message skin reports of prior cervical spine MRI, T-spine MRI and lumbar MRI.  Forget your specialist office to send those over via fax.  Keep follow-up appointment with spine specialist on November 4.  Follow-up in 10 to 14 days or as needed.

## 2020-04-08 ENCOUNTER — Encounter: Payer: Self-pay | Admitting: Medical

## 2020-04-09 ENCOUNTER — Telehealth: Payer: Self-pay | Admitting: Medical

## 2020-04-09 MED ORDER — SUMATRIPTAN SUCCINATE 50 MG PO TABS: 50.0000 mg | ORAL_TABLET | ORAL | 0 refills | Status: DC | PRN

## 2020-04-09 NOTE — Telephone Encounter (Signed)
No migraine medicine on mychart .Marland KitchenMarland Kitchen

## 2020-04-09 NOTE — Telephone Encounter (Signed)
Rx imitrex sent to pt pharmacy/

## 2020-11-30 ENCOUNTER — Emergency Department (HOSPITAL_BASED_OUTPATIENT_CLINIC_OR_DEPARTMENT_OTHER)
Admission: EM | Admit: 2020-11-30 | Discharge: 2020-11-30 | Disposition: A | Discharge status: Home / Self Care | Payer: 59 | Attending: Emergency Medicine | Admitting: Emergency Medicine

## 2020-11-30 ENCOUNTER — Encounter (HOSPITAL_BASED_OUTPATIENT_CLINIC_OR_DEPARTMENT_OTHER): Payer: Self-pay | Admitting: Emergency Medicine

## 2020-11-30 ENCOUNTER — Other Ambulatory Visit: Payer: Self-pay

## 2020-11-30 DIAGNOSIS — Z91040 Latex allergy status: Secondary | ICD-10-CM | POA: Insufficient documentation

## 2020-11-30 DIAGNOSIS — H5711 Ocular pain, right eye: Secondary | ICD-10-CM | POA: Diagnosis not present

## 2020-11-30 DIAGNOSIS — I10 Essential (primary) hypertension: Secondary | ICD-10-CM | POA: Insufficient documentation

## 2020-11-30 DIAGNOSIS — Z87891 Personal history of nicotine dependence: Secondary | ICD-10-CM | POA: Diagnosis not present

## 2020-11-30 DIAGNOSIS — J45909 Unspecified asthma, uncomplicated: Secondary | ICD-10-CM | POA: Insufficient documentation

## 2020-11-30 NOTE — ED Provider Notes (Signed)
[facility] EMERGENCY DEPARTMENT Provider Note   CSN: [id number] Arrival date & time: 11/30/20  [id number]     History Chief Complaint  Patient presents with   Eye Problem    right    Shirley [name] is a 45 y.o. female.  HPI 45 year old female presents with right periorbital discomfort.  She noticed it when she first woke up yesterday where it was swollen and painful.  Felt like a sharp pain.  She was also having trouble seeing out of that eye.  Symptoms have resolved today.  May be a little bit of intermittent discomfort laterally but she feels like the swelling is gone.  She never had any eye redness or globe pain.  She has a history of headaches but this was different.  Her vision was abnormal yesterday but feels fine today.  Past Medical History:  Diagnosis Date   Asthma    Lumbar back pain    Migraine    Stress    Uterine fibroids affecting pregnancy    reports having a surgical procedure -- unsure what    Patient Active Problem List   Diagnosis Date Noted   Syncope 12/10/2018   Obesity, Class III, BMI 40-49.9 (morbid obesity) ([place]) 12/10/2018   Upper and lower extremity pain 03/16/2016   Lumbar radiculopathy 06/11/2015   Headache 06/02/2013   Essential hypertension 06/02/2013    Past Surgical History:  Procedure Laterality Date   ABDOMINAL HYSTERECTOMY     ABDOMINAL HYSTERECTOMY     MYOMECTOMY     TONSILLECTOMY       OB History   No obstetric history on file.     Family History  Problem Relation Age of Onset   Ataxia Neg Hx    Chorea Neg Hx    Dementia Neg Hx    Mental retardation Neg Hx    Migraines Neg Hx    Multiple sclerosis Neg Hx    Neurofibromatosis Neg Hx    Neuropathy Neg Hx    Parkinsonism Neg Hx    Seizures Neg Hx    Stroke Neg Hx     Social History   Tobacco Use   Smoking status: Former    Years: 25.00    Types: Cigarettes    Quit date: 12/06/2018    Years since quitting: 1.9   Smokeless tobacco: Never  Vaping Use    Vaping Use: Never used  Substance Use Topics   Alcohol use: No   Drug use: No    Home Medications Prior to Admission medications   Medication Sig Start Date End Date Taking? Authorizing Provider  diclofenac (VOLTAREN) 75 MG EC tablet Take 1 tablet (75 mg total) by mouth 2 (two) times daily. 01/04/20   Saguier, Percell Miller, PA-C  gabapentin (NEURONTIN) 300 MG capsule Take 300 mg by mouth 3 (three) times daily.    [provider]  HYDROcodone-acetaminophen (NORCO/VICODIN) 5-325 MG tablet Take 1 tablet by mouth every 6 (six) hours as needed. 01/01/20   Khatri, Hina, PA-C  SUMAtriptan (IMITREX) 50 MG tablet Take 1 tablet (50 mg total) by mouth every 2 (two) hours as needed for migraine. May repeat in 2 hours if headache persists or recurs. 04/09/20   Saguier, Percell Miller, PA-C    Allergies    Isovue [iopamidol] and Latex  Review of Systems   Review of Systems  Constitutional:  Negative for fever.  HENT:  Positive for facial swelling.   Eyes:  Positive for visual disturbance. Negative for pain, discharge and redness.  Neurological:  Negative for headaches.   Physical Exam Updated Vital Signs BP (!) 143/84 (BP Location: Right Arm)   Pulse 84   Temp 98.3 F (36.8 C) (Oral)   Resp 16   Ht '5\' 6"'$  (1.676 m)   Wt 108 kg   SpO2 96%   BMI 38.41 kg/m   Physical Exam Vitals and nursing note reviewed.  Constitutional:      Appearance: She is well-developed.  HENT:     Head: Normocephalic and atraumatic.     Right Ear: External ear normal.     Left Ear: External ear normal.     Nose: Nose normal.  Eyes:     General:        Right eye: No discharge.        Left eye: No discharge.     Extraocular Movements: Extraocular movements intact.     Pupils: Pupils are equal, round, and reactive to light.     Comments: No periorbital swelling or tenderness.  No pain with extraocular movements.  Pulmonary:     Effort: Pulmonary effort is normal.  Abdominal:     General: There is no distension.   Skin:    General: Skin is warm and dry.  Neurological:     Mental Status: She is alert.  Psychiatric:        Mood and Affect: Mood is not anxious.    ED Results / Procedures / Treatments   Labs (all labs ordered are listed, but only abnormal results are displayed) Labs Reviewed - No data to display  EKG None  Radiology No results found.  Procedures Procedures   Medications Ordered in ED Medications - No data to display  ED Course  I have reviewed the triage vital signs and the nursing notes.  Pertinent labs & imaging results that were available during my care of the patient were reviewed by me and considered in my medical decision making (see chart for details).    MDM Rules/Calculators/A&P                           No findings on exam.  Unclear what caused her transient swelling but the does not appear to be any actual globe emergency and given no symptoms now I think she is stable for discharge home with follow-up with PCP as needed. Final Clinical Impression(s) / ED Diagnoses Final diagnoses:  Periorbital pain, right    Rx / DC Orders ED Discharge Orders     None        Sherwood Gambler, MD 11/30/20 [phone]

## 2020-11-30 NOTE — ED Triage Notes (Signed)
Right eye pain , swelling , redness yesterday. Sts was not able to see yesterday, regained vision today. No further symptoms.

## 2020-11-30 NOTE — ED Notes (Signed)
Patient Alert and oriented to baseline. Stable and ambulatory to baseline. Patient verbalized understanding of the discharge instructions.  Patient belongings were taken by the patient.   

## 2021-04-23 ENCOUNTER — Encounter: Payer: 59 | Admitting: Medical

## 2021-10-15 ENCOUNTER — Emergency Department (HOSPITAL_BASED_OUTPATIENT_CLINIC_OR_DEPARTMENT_OTHER)
Admission: EM | Admit: 2021-10-15 | Discharge: 2021-10-15 | Disposition: A | Discharge status: Home / Self Care | Payer: BC Managed Care – PPO | Attending: Emergency Medicine | Admitting: Emergency Medicine

## 2021-10-15 ENCOUNTER — Other Ambulatory Visit: Payer: Self-pay

## 2021-10-15 DIAGNOSIS — G43009 Migraine without aura, not intractable, without status migrainosus: Secondary | ICD-10-CM | POA: Diagnosis not present

## 2021-10-15 DIAGNOSIS — J45909 Unspecified asthma, uncomplicated: Secondary | ICD-10-CM | POA: Diagnosis not present

## 2021-10-15 DIAGNOSIS — R519 Headache, unspecified: Secondary | ICD-10-CM | POA: Diagnosis not present

## 2021-10-15 DIAGNOSIS — Z91040 Latex allergy status: Secondary | ICD-10-CM | POA: Diagnosis not present

## 2021-10-15 MED ORDER — SUMATRIPTAN SUCCINATE 50 MG PO TABS: 50.0000 mg | ORAL_TABLET | ORAL | 0 refills | Status: DC | PRN

## 2021-10-15 MED ORDER — DIPHENHYDRAMINE HCL 50 MG/ML IJ SOLN
25.0000 mg | Freq: Once | INTRAMUSCULAR | Status: AC
Start: 2021-10-15 — End: 2021-10-15
  Administered 2021-10-15: 25 mg via INTRAVENOUS
  Filled 2021-10-15: qty 1

## 2021-10-15 MED ORDER — KETOROLAC TROMETHAMINE 15 MG/ML IJ SOLN
15.0000 mg | Freq: Once | INTRAMUSCULAR | Status: AC
Start: 2021-10-15 — End: 2021-10-15
  Administered 2021-10-15: 15 mg via INTRAVENOUS
  Filled 2021-10-15: qty 1

## 2021-10-15 MED ORDER — PROCHLORPERAZINE EDISYLATE 10 MG/2ML IJ SOLN
10.0000 mg | Freq: Once | INTRAMUSCULAR | Status: AC
  Administered 2021-10-15: 10 mg via INTRAVENOUS
  Filled 2021-10-15: qty 2

## 2021-10-15 MED ORDER — LACTATED RINGERS IV BOLUS
500.0000 mL | Freq: Once | INTRAVENOUS | Status: AC
  Administered 2021-10-15: 500 mL via INTRAVENOUS

## 2021-10-15 NOTE — ED Provider Notes (Signed)
[facility] DEPARTMENT Provider Note   CSN: [id number] Arrival date & time: 10/15/21  1154     History  Chief Complaint  Patient presents with   Migraine    Shirley [name] is a 46 y.o. female with history of asthma, migraines who presents to the emergency department for evaluation of migraine headaches x2 weeks.  Patient states that she will take Excedrin Migraine and symptoms resolve temporarily and then returned.  She has intermittently had numbness in her left arm although is without left-sided numbness and tingling.  She does note that this symptom occurs during previous migraine headaches.  She was seen here in the emergency department a couple of years ago with severe migraine with similar associated symptoms.  She had normal CT scan done at that time.  She was discharged home with Topamax, and she said that this has helped, however she has not needed it in some time.  She has started another job that is reportedly causing her severe stress and she is working over 55 hours a week.  She denies fevers, chills, chest pain, weakness, nausea, vomiting.   Migraine Associated symptoms include headaches. Pertinent negatives include no chest pain.       Home Medications Prior to Admission medications   Medication Sig Start Date End Date Taking? Authorizing Provider  SUMAtriptan (IMITREX) 50 MG tablet Take 1 tablet (50 mg total) by mouth every 2 (two) hours as needed for migraine. May repeat in 2 hours if headache persists or recurs. 10/15/21  Yes Tonye Pearson, PA-C  diclofenac (VOLTAREN) 75 MG EC tablet Take 1 tablet (75 mg total) by mouth 2 (two) times daily. 01/04/20   Saguier, Percell Miller, PA-C  gabapentin (NEURONTIN) 300 MG capsule Take 300 mg by mouth 3 (three) times daily.    [provider]  HYDROcodone-acetaminophen (NORCO/VICODIN) 5-325 MG tablet Take 1 tablet by mouth every 6 (six) hours as needed. 01/01/20   Khatri, Hina, PA-C      Allergies     Isovue [iopamidol] and Latex    Review of Systems   Review of Systems  Constitutional:  Negative for fever.  Cardiovascular:  Negative for chest pain.  Gastrointestinal:  Negative for nausea and vomiting.  Neurological:  Positive for headaches. Negative for syncope and weakness.    Physical Exam Updated Vital Signs BP (!) 151/80   Pulse (!) 48   Temp 98.4 F (36.9 C) (Oral)   Resp 16   Ht '5\' 6"'$  (1.676 m)   Wt 108 kg   SpO2 100%   BMI 38.41 kg/m  Physical Exam Vitals and nursing note reviewed.  Constitutional:      General: She is not in acute distress.    Appearance: She is not ill-appearing.  HENT:     Head: Atraumatic.  Eyes:     Extraocular Movements: Extraocular movements intact.     Conjunctiva/sclera: Conjunctivae normal.     Pupils: Pupils are equal, round, and reactive to light.  Cardiovascular:     Rate and Rhythm: Normal rate and regular rhythm.     Pulses: Normal pulses.     Heart sounds: No murmur heard. Pulmonary:     Effort: Pulmonary effort is normal. No respiratory distress.     Breath sounds: Normal breath sounds.  Abdominal:     General: Abdomen is flat. There is no distension.     Palpations: Abdomen is soft.     Tenderness: There is no abdominal tenderness.  Musculoskeletal:  General: Normal range of motion.     Cervical back: Normal range of motion.  Skin:    General: Skin is warm and dry.     Capillary Refill: Capillary refill takes less than 2 seconds.  Neurological:     General: No focal deficit present.     Mental Status: She is alert.     Comments: Speech is clear, able to follow commands CN III-XII intact Normal strength in upper and lower extremities bilaterally including dorsiflexion and plantar flexion, strong and equal grip strength Subjective sensation intact to the bilateral upper and lower extremities Moves extremities without ataxia, coordination intact Normal finger to nose and rapid alternating movements No  pronator drift    Psychiatric:        Mood and Affect: Mood normal.     ED Results / Procedures / Treatments   Labs (all labs ordered are listed, but only abnormal results are displayed) Labs Reviewed - No data to display  EKG None  Radiology No results found.  Procedures Procedures    Medications Ordered in ED Medications  lactated ringers bolus 500 mL (500 mLs Intravenous New Bag/Given 10/15/21 1337)  prochlorperazine (COMPAZINE) injection 10 mg (10 mg Intravenous Given 10/15/21 1332)  diphenhydrAMINE (BENADRYL) injection 25 mg (25 mg Intravenous Given 10/15/21 1330)  ketorolac (TORADOL) 15 MG/ML injection 15 mg (15 mg Intravenous Given 10/15/21 1329)    ED Course/ Medical Decision Making/ A&P                           Medical Decision Making Risk Prescription drug management.   Presents in no acute distress and is nontoxic-appearing.  Differentials include CVA, complex migraine, simple migraine, tension headache.  Vitals without significant abnormality she is satting well on room air.  Neuro exam normal without any focal deficits.  I considered obtaining CT imaging given her previous complaints of left arm numbness, however she reports that this symptom often occurs with her severe migraines.  She had CT imaging if the previous migraine episode that was normal at the time.  I treated her migraine with migraine cocktail with IV fluids.  On reevaluation, patient states symptoms are completely resolved and she is feeling much better.  I have provided her referral to neurology and refilled her sumatriptan.  Return precautions were discussed.  Discharged home in stable condition.  Final Clinical Impression(s) / ED Diagnoses Final diagnoses:  Migraine without aura and without status migrainosus, not intractable    Rx / DC Orders ED Discharge Orders          Ordered    SUMAtriptan (IMITREX) 50 MG tablet  Every 2 hours PRN        10/15/21 1432              Tonye Pearson, PA-C 10/15/21 1445    Ezequiel Essex, MD 10/15/21 1515

## 2021-10-15 NOTE — ED Triage Notes (Signed)
Adds she had numbness in her left arm since yesterday when she woke up. Has improved slightly.

## 2021-10-15 NOTE — Discharge Instructions (Signed)
I am glad you are feeling much better after the migraine cocktail here.  I sent you in some more sumatriptan hand to help with your migraines.  This is likely related to increased stress.  I have also given you a referral to her neurologist-please call them to schedule an appointment if you are migraines become more frequent.

## 2021-10-15 NOTE — ED Triage Notes (Signed)
Migraine x 2 weeks, hx of same. Taking excedrin without relief. Nausea, light sensitivity.

## 2021-10-15 NOTE — ED Notes (Addendum)
Labs and urine collected, holding at lab

## 2021-11-15 ENCOUNTER — Emergency Department (HOSPITAL_BASED_OUTPATIENT_CLINIC_OR_DEPARTMENT_OTHER)
Admission: EM | Admit: 2021-11-15 | Discharge: 2021-11-15 | Disposition: A | Discharge status: Home / Self Care | Payer: BC Managed Care – PPO | Attending: Emergency Medicine | Admitting: Emergency Medicine

## 2021-11-15 ENCOUNTER — Other Ambulatory Visit: Payer: Self-pay

## 2021-11-15 ENCOUNTER — Encounter (HOSPITAL_BASED_OUTPATIENT_CLINIC_OR_DEPARTMENT_OTHER): Payer: Self-pay | Admitting: Emergency Medicine

## 2021-11-15 DIAGNOSIS — R519 Headache, unspecified: Secondary | ICD-10-CM | POA: Diagnosis not present

## 2021-11-15 DIAGNOSIS — J45909 Unspecified asthma, uncomplicated: Secondary | ICD-10-CM | POA: Insufficient documentation

## 2021-11-15 DIAGNOSIS — G43011 Migraine without aura, intractable, with status migrainosus: Secondary | ICD-10-CM | POA: Diagnosis not present

## 2021-11-15 DIAGNOSIS — Z87891 Personal history of nicotine dependence: Secondary | ICD-10-CM | POA: Diagnosis not present

## 2021-11-15 DIAGNOSIS — Z91040 Latex allergy status: Secondary | ICD-10-CM | POA: Diagnosis not present

## 2021-11-15 DIAGNOSIS — G43009 Migraine without aura, not intractable, without status migrainosus: Secondary | ICD-10-CM | POA: Diagnosis not present

## 2021-11-15 MED ORDER — METOCLOPRAMIDE HCL 5 MG/ML IJ SOLN
10.0000 mg | Freq: Once | INTRAMUSCULAR | Status: AC
  Administered 2021-11-15: 10 mg via INTRAVENOUS
  Filled 2021-11-15: qty 2

## 2021-11-15 MED ORDER — SODIUM CHLORIDE 0.9 % IV BOLUS
1000.0000 mL | Freq: Once | INTRAVENOUS | Status: AC
  Administered 2021-11-15: 1000 mL via INTRAVENOUS

## 2021-11-15 MED ORDER — DEXAMETHASONE SODIUM PHOSPHATE 10 MG/ML IJ SOLN
10.0000 mg | Freq: Once | INTRAMUSCULAR | Status: AC
  Administered 2021-11-15: 10 mg via INTRAVENOUS
  Filled 2021-11-15: qty 1

## 2021-11-15 MED ORDER — SODIUM CHLORIDE 0.9 % IV SOLN: INTRAVENOUS | Status: DC

## 2021-11-15 MED ORDER — DIPHENHYDRAMINE HCL 50 MG/ML IJ SOLN
25.0000 mg | Freq: Once | INTRAMUSCULAR | Status: AC
  Administered 2021-11-15: 25 mg via INTRAVENOUS
  Filled 2021-11-15: qty 1

## 2021-11-15 NOTE — ED Provider Notes (Addendum)
[facility]PART[facility] Provider Note   CSN: [id number] Arrival date & time: 11/15/21  0741     History  Chief Complaint  Patient presents with   Migraine    Shirley [name] is a 46 y.o. female.  Patient with a history of migraines.  Onset of headache that she felt was migraine-like yesterday while driving to work.  It is associated with some visual changes not photophobia but more blurred vision.  No nausea or vomiting.  Patient states the pain is right forehead area and right temporal area.  Very suggestive of migraine to her.  Past medical history significant for migraines asthma lumbar back pain.  Past surgical history significant for abdominal hysterectomy.  Patient is a former smoker quit in 2020.       Home Medications Prior to Admission medications   Medication Sig Start Date End Date Taking? Authorizing Provider  diclofenac (VOLTAREN) 75 MG EC tablet Take 1 tablet (75 mg total) by mouth 2 (two) times daily. 01/04/20   Saguier, Percell Miller, PA-C  gabapentin (NEURONTIN) 300 MG capsule Take 300 mg by mouth 3 (three) times daily.    [provider]  HYDROcodone-acetaminophen (NORCO/VICODIN) 5-325 MG tablet Take 1 tablet by mouth every 6 (six) hours as needed. 01/01/20   Khatri, Hina, PA-C  SUMAtriptan (IMITREX) 50 MG tablet Take 1 tablet (50 mg total) by mouth every 2 (two) hours as needed for migraine. May repeat in 2 hours if headache persists or recurs. 10/15/21   Tonye Pearson, PA-C      Allergies    Other, Isovue [iopamidol], and Latex    Review of Systems   Review of Systems  Constitutional:  Negative for chills and fever.  HENT:  Negative for ear pain and sore throat.   Eyes:  Positive for visual disturbance. Negative for photophobia and pain.  Respiratory:  Negative for cough and shortness of breath.   Cardiovascular:  Negative for chest pain and palpitations.  Gastrointestinal:  Negative for abdominal pain and vomiting.  Genitourinary:   Negative for dysuria and hematuria.  Musculoskeletal:  Negative for arthralgias, back pain and neck pain.  Skin:  Negative for color change and rash.  Neurological:  Positive for headaches. Negative for seizures and syncope.  All other systems reviewed and are negative.   Physical Exam Updated Vital Signs BP 132/82 (BP Location: Left Arm)   Pulse 72   Temp 98 F (36.7 C) (Oral)   Resp 18   Ht 1.676 m ('5\' 6"'$ )   Wt 108.9 kg   LMP 09/01/2014   SpO2 99%   BMI 38.74 kg/m  Physical Exam Vitals and nursing note reviewed.  Constitutional:      General: She is not in acute distress.    Appearance: Normal appearance. She is well-developed.  HENT:     Head: Normocephalic and atraumatic.  Eyes:     Extraocular Movements: Extraocular movements intact.     Conjunctiva/sclera: Conjunctivae normal.     Pupils: Pupils are equal, round, and reactive to light.  Cardiovascular:     Rate and Rhythm: Normal rate and regular rhythm.     Heart sounds: No murmur heard. Pulmonary:     Effort: Pulmonary effort is normal. No respiratory distress.     Breath sounds: Normal breath sounds.  Abdominal:     Palpations: Abdomen is soft.     Tenderness: There is no abdominal tenderness.  Musculoskeletal:        General: No swelling.  Cervical back: Neck supple. No rigidity.  Skin:    General: Skin is warm and dry.     Capillary Refill: Capillary refill takes less than 2 seconds.  Neurological:     General: No focal deficit present.     Mental Status: She is alert and oriented to person, place, and time.     Cranial Nerves: No cranial nerve deficit.     Sensory: No sensory deficit.  Psychiatric:        Mood and Affect: Mood normal.     ED Results / Procedures / Treatments   Labs (all labs ordered are listed, but only abnormal results are displayed) Labs Reviewed - No data to display  EKG None  Radiology No results found.  Procedures Procedures    Medications Ordered in  ED Medications  0.9 %  sodium chloride infusion (has no administration in time range)  sodium chloride 0.9 % bolus 1,000 mL (1,000 mLs Intravenous New Bag/Given 11/15/21 0830)  dexamethasone (DECADRON) injection 10 mg (10 mg Intravenous Given 11/15/21 0833)  diphenhydrAMINE (BENADRYL) injection 25 mg (25 mg Intravenous Given 11/15/21 0833)  metoCLOPramide (REGLAN) injection 10 mg (10 mg Intravenous Given 11/15/21 8144)    ED Course/ Medical Decision Making/ A&P                           Medical Decision Making Risk Prescription drug management.   Symptoms very suggestive of typical migraine for patient.  Will treat with migraine cocktail.  Reglan Decadron Benadryl IV and 1 L of fluid.  Clinically no labs required.  Patient feeling much better with the migraine cocktail.  Patient stable for discharge home.   Final Clinical Impression(s) / ED Diagnoses Final diagnoses:  Intractable migraine without aura and with status migrainosus    Rx / DC Orders ED Discharge Orders     None         Fredia Sorrow, MD 11/15/21 8185    Fredia Sorrow, MD 11/15/21 [phone]

## 2021-11-15 NOTE — ED Triage Notes (Signed)
States has had a migraine since yesterday. HX of same, has been unable to get into see neurology

## 2021-11-15 NOTE — Discharge Instructions (Signed)
Follow-up with your doctors and follow-up with your neurologist.  Return for any new or worse symptoms.  Work note provided.

## 2021-11-20 ENCOUNTER — Ambulatory Visit (INDEPENDENT_AMBULATORY_CARE_PROVIDER_SITE_OTHER): Payer: BC Managed Care – PPO | Admitting: Medical

## 2021-11-20 VITALS — BP 134/80 | HR 67 | Resp 18 | Ht 66.0 in | Wt 237.8 lb

## 2021-11-20 DIAGNOSIS — G43701 Chronic migraine without aura, not intractable, with status migrainosus: Secondary | ICD-10-CM | POA: Diagnosis not present

## 2021-11-20 MED ORDER — CYCLOBENZAPRINE HCL 10 MG PO TABS: ORAL_TABLET | ORAL | 0 refills | Status: DC

## 2021-11-20 MED ORDER — BUTALBITAL-APAP-CAFFEINE 50-325-40 MG PO TABS: 1.0000 | ORAL_TABLET | Freq: Four times a day (QID) | ORAL | 0 refills | Status: DC | PRN

## 2021-11-20 MED ORDER — KETOROLAC TROMETHAMINE 60 MG/2ML IM SOLN
60.0000 mg | Freq: Once | INTRAMUSCULAR | Status: AC
  Administered 2021-11-20: 60 mg via INTRAMUSCULAR

## 2021-11-20 NOTE — Progress Notes (Signed)
Subjective:    Patient ID: Shirley [name], female    DOB: Mar 28, 1975, 46 y.o.   MRN: [id number]  HPI   Pt had recent migraine headaches. Seen twice in ED since October 15, 2021   Pt gives me fmla forms from her employer. On review I do not see portions about days allotted per month.  In past had headaches.  "A/P 09-06-2019  For your history of worsening migraine headaches with most recent headache being 4 days, I prescribed you Fioricet to use 1 tablet every 6 hours as needed migraine headache.   Give me an update whether or not this stops current headache by tomorrow.  Toradol injection declined today.  Previously used Imitrex and Topamax but failed that treatment.   Good neurologic exam today.   If headache persist with worsening signs/symptoms then have to recommend ED evaluation.   I do want you to go ahead and call Dr. Jaynee Eagles  office and asked to be scheduled for appointment.  The number to their office is [phone].  You can note that referral in epic is available to review."   Pt tells me she never saw Dr. Jaynee Eagles.    Pt tells me her ha did subside for about 2 years. But around 10-15-2021 her ha came back with severe intensity. Brief relief with treatment from ED.  Last tx from ED only lasted one day but then HA came back. Tx that day 11-15-2021 was  below in "  "Symptoms very suggestive of typical migraine for patient.  Will treat with migraine cocktail.  Reglan Decadron Benadryl IV and 1 L of fluid."  Ct head 12-08-2018  IMPRESSION: Brain parenchyma appears unremarkable. No demonstrable acute infarct. No evident mass or hemorrhage.  Pt has daily ha after last headache came back. She states current ha level 10.  Headaches are light sensitive in the past.  Over past month she has missed 7 days.  Lmp- 11-15-2021.  Review of Systems  Constitutional:  Negative for chills, fatigue and fever.  Respiratory:  Negative for cough, chest tightness, shortness of breath  and wheezing.   Cardiovascular:  Negative for chest pain and palpitations.  Gastrointestinal:  Negative for abdominal pain.  Genitourinary:  Negative for dyspareunia, enuresis, flank pain, frequency, urgency, vaginal bleeding and vaginal pain.  Musculoskeletal:  Negative for back pain, gait problem, myalgias and neck stiffness.  Skin:  Negative for rash.    Past Medical History:  Diagnosis Date   Asthma    Lumbar back pain    Migraine    Stress    Uterine fibroids affecting pregnancy    reports having a surgical procedure -- unsure what     Social History   Socioeconomic History   Marital status: Single    Spouse name: Not on file   Number of children: Not on file   Years of education: Not on file   Highest education level: Not on file  Occupational History   Occupation: Teacher, English as a foreign language  Tobacco Use   Smoking status: Former    Years: 25.00    Types: Cigarettes    Quit date: 12/06/2018    Years since quitting: 2.9   Smokeless tobacco: Never  Vaping Use   Vaping Use: Never used  Substance and Sexual Activity   Alcohol use: No   Drug use: No   Sexual activity: Yes    Birth control/protection: Surgical  Other Topics Concern   Not on file  Social History Narrative   Not on  file   Social Determinants of Health   Financial Resource Strain: Not on file  Food Insecurity: Not on file  Transportation Needs: Not on file  Physical Activity: Not on file  Stress: Not on file  Social Connections: Not on file  Intimate Partner Violence: Not on file    Past Surgical History:  Procedure Laterality Date   ABDOMINAL HYSTERECTOMY     ABDOMINAL HYSTERECTOMY     MYOMECTOMY     TONSILLECTOMY      Family History  Problem Relation Age of Onset   Ataxia Neg Hx    Chorea Neg Hx    Dementia Neg Hx    Mental retardation Neg Hx    Migraines Neg Hx    Multiple sclerosis Neg Hx    Neurofibromatosis Neg Hx    Neuropathy Neg Hx    Parkinsonism Neg Hx    Seizures Neg Hx     Stroke Neg Hx     Allergies  Allergen Reactions   Other Anaphylaxis    IV CT Scan Contrast   Isovue [Iopamidol] Itching    Eye redness/swelling and itching post contrast, hives on upper back   Latex Rash    Current Outpatient Medications on File Prior to Visit  Medication Sig Dispense Refill   diclofenac (VOLTAREN) 75 MG EC tablet Take 1 tablet (75 mg total) by mouth 2 (two) times daily. 14 tablet 0   gabapentin (NEURONTIN) 300 MG capsule Take 300 mg by mouth 3 (three) times daily.     SUMAtriptan (IMITREX) 50 MG tablet Take 1 tablet (50 mg total) by mouth every 2 (two) hours as needed for migraine. May repeat in 2 hours if headache persists or recurs. 10 tablet 0   No current facility-administered medications on file prior to visit.    BP 134/80   Pulse 67   Resp 18   Ht '5\' 6"'$  (1.676 m)   Wt 237 lb 12.8 oz (107.9 kg)   LMP 09/01/2014   SpO2 99%   BMI 38.38 kg/m        Objective:   Physical Exam  General Mental Status- Alert. General Appearance- Not in acute distress.   Skin General: Color- Normal Color. Moisture- Normal Moisture.  Neck Carotid Arteries- Normal color. Moisture- Normal Moisture. No carotid bruits. No JVD. Trapezius tender to palpation.  Chest and Lung Exam Auscultation: Breath Sounds:-Normal.  Cardiovascular Auscultation:Rythm- Regular. Murmurs & Other Heart Sounds:Auscultation of the heart reveals- No Murmurs.  Abdomen Inspection:-Inspeection Normal. Palpation/Percussion:Note:No mass. Palpation and Percussion of the abdomen reveal- Non Tender, Non Distended + BS, no rebound or guarding.    Neurologic Cranial Nerve exam:- CN III-XII intact(No nystagmus), symmetric smile. Drift Test:- No drift. Finger to Nose:- Normal/Intact Strength:- 5/5 equal and symmetric strength both upper and lower extremities.  On eye exam-light sensititiv      Assessment & Plan:  [id number].   Patient Instructions  Migraine frequent and recurrent over  past month. Severe type ha. Negative CT of head in 2020.  With recent severe headaches we will give Toradol 60 mg IM today.  Later today when you arrive home can use Flexeril 10 mg as you do have some pain in trapezius indicating possible tension component.  Also making short course of Fioricet available to use if needed.  We will see if we can break the cycle of recent recurrent headaches.  Also I tried to put you back on Imitrex and Topamax in the near future.  If headaches with any motor  or sensory function deficits then be seen in the emergency department.  Will place referral to neurologist Dr. Jaynee Eagles  Was a note excusing you for next 4 days as we determine if current headache can be resolved.  Also will note that need to assess response to today's treatment before filling out FMLA form.  Follow-up in 1 week or sooner if needed.

## 2021-11-20 NOTE — Patient Instructions (Addendum)
Migraine frequent and recurrent over past month. Severe type ha. Negative CT of head in 2020.  With recent severe headaches we will give Toradol 60 mg IM today.  Later today when you arrive home can use Flexeril 10 mg as you do have some pain in trapezius indicating possible tension component.  Also making short course of Fioricet available to use if needed.  We will see if we can break the cycle of recent recurrent headaches.  Also I tried to put you back on Imitrex and Topamax in the near future.  If headaches with any motor or sensory function deficits then be seen in the emergency department.  Will place referral to neurologist Dr. Jaynee Eagles  Was a note excusing you for next 4 days as we determine if current headache can be resolved.  Also will note that need to assess response to today's treatment before filling out FMLA form.  Follow-up in 1 week or sooner if needed.

## 2021-11-25 ENCOUNTER — Encounter: Payer: Self-pay | Admitting: Medical

## 2021-11-25 ENCOUNTER — Telehealth: Payer: Self-pay | Admitting: Medical

## 2021-11-25 NOTE — Telephone Encounter (Signed)
Patient has FMLA paperwork that needs to be filled out.  Would you call and see how she is doing regarding most recent severe headaches.  Also wanted her to get scheduled for an office or virtual appointment this Wednesday or Thursday to get update on status and fill out paperwork.

## 2021-11-26 NOTE — Telephone Encounter (Signed)
Attempted to contact patient, no answer.  Patient is scheduled for f/u with PCP on 11/27/21.

## 2021-11-27 ENCOUNTER — Ambulatory Visit: Payer: BC Managed Care – PPO | Admitting: Medical

## 2021-12-04 ENCOUNTER — Encounter: Payer: Self-pay | Admitting: Medical

## 2021-12-04 ENCOUNTER — Telehealth: Payer: Self-pay

## 2021-12-04 ENCOUNTER — Ambulatory Visit (INDEPENDENT_AMBULATORY_CARE_PROVIDER_SITE_OTHER): Payer: BC Managed Care – PPO | Admitting: Medical

## 2021-12-04 VITALS — BP 126/78 | HR 65 | Resp 18 | Ht 66.0 in | Wt 243.0 lb

## 2021-12-04 DIAGNOSIS — G43701 Chronic migraine without aura, not intractable, with status migrainosus: Secondary | ICD-10-CM

## 2021-12-04 MED ORDER — TOPIRAMATE 25 MG PO TABS: 25.0000 mg | ORAL_TABLET | Freq: Two times a day (BID) | ORAL | 0 refills | Status: DC

## 2021-12-04 MED ORDER — ZOLMITRIPTAN 5 MG PO TABS: 5.0000 mg | ORAL_TABLET | ORAL | 0 refills | Status: DC | PRN

## 2021-12-04 NOTE — Progress Notes (Signed)
Subjective:    Patient ID: Shirley [name], female    DOB: [birth year], 46 y.o.   MRN: [id number]  HPI  Pt in reporting ha. Pt states ha that are lasting for days. Pt states since last visit with me she has no ha today or yesterday.   But 12 day out of 14 days she had  sever HA.   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  Pt does report that her ha ar light sensitive.   Pt has been referred to Dr. Jaynee Eagles in th past.   In remote past does report in past had some improvement with imitrex and topamax.  Has not used this combination recently.     Review of Systems  Constitutional:  Negative for chills, fatigue and fever.  Respiratory:  Negative for cough, chest tightness, shortness of breath and wheezing.   Cardiovascular:  Negative for chest pain.  Gastrointestinal:  Negative for abdominal pain.  Genitourinary:  Negative for dysuria.  Musculoskeletal:  Negative for back pain.  Skin:  Negative for rash.  Neurological:  Negative for syncope, facial asymmetry, speech difficulty, weakness and headaches.  Hematological:  Negative for adenopathy. Does not bruise/bleed easily.  Psychiatric/Behavioral:  Negative for behavioral problems, decreased concentration, dysphoric mood and sleep disturbance. The patient is not nervous/anxious.     Past Medical History:  Diagnosis Date   Asthma    Lumbar back pain    Migraine    Stress    Uterine fibroids affecting pregnancy    reports having a surgical procedure -- unsure what     Social History   Socioeconomic History   Marital status: Single    Spouse name: Not on file   Number of children: Not on file   Years of education: Not on file   Highest education level: Not on file  Occupational History   Occupation: Teacher, English as a foreign language  Tobacco Use   Smoking status: Former    Years: 25.00    Types: Cigarettes    Quit date: 12/06/2018    Years since quitting: 2.9   Smokeless tobacco: Never  Vaping Use   Vaping Use: Never used   Substance and Sexual Activity   Alcohol use: No   Drug use: No   Sexual activity: Yes    Birth control/protection: Surgical  Other Topics Concern   Not on file  Social History Narrative   Not on file   Social Determinants of Health   Financial Resource Strain: Not on file  Food Insecurity: Not on file  Transportation Needs: Not on file  Physical Activity: Not on file  Stress: Not on file  Social Connections: Not on file  Intimate Partner Violence: Not on file    Past Surgical History:  Procedure Laterality Date   ABDOMINAL HYSTERECTOMY     ABDOMINAL HYSTERECTOMY     MYOMECTOMY     TONSILLECTOMY      Family History  Problem Relation Age of Onset   Ataxia Neg Hx    Chorea Neg Hx    Dementia Neg Hx    Mental retardation Neg Hx    Migraines Neg Hx    Multiple sclerosis Neg Hx    Neurofibromatosis Neg Hx    Neuropathy Neg Hx    Parkinsonism Neg Hx    Seizures Neg Hx    Stroke Neg Hx     Allergies  Allergen Reactions   Other Anaphylaxis    IV CT Scan Contrast   Isovue [Iopamidol] Itching  Eye redness/swelling and itching post contrast, hives on upper back   Latex Rash    Current Outpatient Medications on File Prior to Visit  Medication Sig Dispense Refill   butalbital-acetaminophen-caffeine (FIORICET) 50-325-40 MG tablet Take 1 tablet by mouth every 6 (six) hours as needed for headache. 14 tablet 0   cyclobenzaprine (FLEXERIL) 10 MG tablet 1 tablet p.o. nightly as needed severe headache with trapezius pain. 7 tablet 0   diclofenac (VOLTAREN) 75 MG EC tablet Take 1 tablet (75 mg total) by mouth 2 (two) times daily. 14 tablet 0   gabapentin (NEURONTIN) 300 MG capsule Take 300 mg by mouth 3 (three) times daily.     No current facility-administered medications on file prior to visit.    BP (!) 143/103   Pulse 65   Resp 18   Ht '5\' 6"'$  (1.676 m)   Wt 243 lb (110.2 kg)   LMP 09/01/2014   SpO2 96%   BMI 39.22 kg/m          Objective:   Physical  Exam  General Mental Status- Alert. General Appearance- Not in acute distress.     Chest and Lung Exam Auscultation: Breath Sounds:-Normal.  Cardiovascular Auscultation:Rythm- Regular. Murmurs & Other Heart Sounds:Auscultation of the heart reveals- No Murmurs.   Neurologic Cranial Nerve exam:- CN III-XII intact(No nystagmus), symmetric smile. Drift Test:- No drift. Romberg Exam:- Negative.  Heal to Toe Gait exam:-Normal. Finger to Nose:- Normal/Intact Strength:- 5/5 equal and symmetric strength both upper and lower extremities.       Assessment & Plan:   Patient Instructions  I do want you to go ahead and call Dr. Jaynee Eagles office and ask to be scheduled for appointment.  The number to their office is [phone].   Increasing severe headaches and more frequent headaches recently. I will prescribe Topamax with hope to prevent headaches and prescribe Zomig to take early on for migraine type headache.  Not to use Imitrex with building since its in the same class of condition.  Hoping that the Zomig will work better than imtirex.  You do also have Fioricet available but prefer that you use the above arthritis early on for migraine-like headache.  If you get headache with any motor or sensory deficits then have to recommend emergency department evaluation.  Filled out for FMLA form today during office visit.  Your blood pressure was elevated today but on previous visits blood pressure was in normal range.  We talked that you left the office and I want you to check your blood pressure when you get home and also check blood pressure 2-3 times a week.  Need to verify that your blood pressure is not coming elevated.  Please send me a MyChart blood pressure update today as you check BP.  Follow-up December 20, 2021 or sooner if needed.     Mackie Pai, PA-C   Time spent with patient today was 43 minutes which consisted of chart review, discussing diagnosis, work up,  treatment, filling out fmla form, answering questions and documentation.

## 2021-12-04 NOTE — Patient Instructions (Addendum)
I do want you to go ahead and call Dr. Jaynee Eagles office and ask to be scheduled for appointment.  The number to their office is [phone].   Increasing severe headaches and more frequent headaches recently. I will prescribe Topamax with hope to prevent headaches and prescribe Zomig to take early on for migraine type headache.  Not to use Imitrex with building since its in the same class of condition.  Hoping that the Zomig will work better than imtirex.  You do also have Fioricet available but prefer that you use the above arthritis early on for migraine-like headache.  If you get headache with any motor or sensory deficits then have to recommend emergency department evaluation.  Filled out for FMLA form today during office visit.  Your blood pressure was elevated today but on previous visits blood pressure was in normal range.  We talked that you left the office and I want you to check your blood pressure when you get home and also check blood pressure 2-3 times a week.  Need to verify that your blood pressure is not coming elevated.  Please send me a MyChart blood pressure update today as you check BP.  Follow-up December 20, 2021 or sooner if needed.

## 2021-12-04 NOTE — Telephone Encounter (Signed)
Pt came in for an appointment today and her FMLA forms were completed by the provider. The forms have been faxed and a copy given to [facility]. Once confirmation come through then it will be placed in the scan bin.

## 2021-12-25 ENCOUNTER — Other Ambulatory Visit (HOSPITAL_BASED_OUTPATIENT_CLINIC_OR_DEPARTMENT_OTHER): Payer: Self-pay

## 2021-12-25 ENCOUNTER — Ambulatory Visit (INDEPENDENT_AMBULATORY_CARE_PROVIDER_SITE_OTHER): Payer: BC Managed Care – PPO | Admitting: Medical

## 2021-12-25 VITALS — BP 130/77 | HR 68 | Resp 18 | Ht 66.0 in | Wt 240.0 lb

## 2021-12-25 DIAGNOSIS — G44209 Tension-type headache, unspecified, not intractable: Secondary | ICD-10-CM

## 2021-12-25 DIAGNOSIS — M5440 Lumbago with sciatica, unspecified side: Secondary | ICD-10-CM

## 2021-12-25 DIAGNOSIS — M5416 Radiculopathy, lumbar region: Secondary | ICD-10-CM | POA: Diagnosis not present

## 2021-12-25 DIAGNOSIS — M542 Cervicalgia: Secondary | ICD-10-CM | POA: Diagnosis not present

## 2021-12-25 MED ORDER — BUTALBITAL-APAP-CAFFEINE 50-325-40 MG PO TABS
1.0000 | ORAL_TABLET | Freq: Four times a day (QID) | ORAL | 0 refills | Status: DC | PRN
  Filled 2021-12-25: qty 14, 4d supply, fill #0

## 2021-12-25 MED ORDER — ZOLMITRIPTAN 5 MG PO TABS
5.0000 mg | ORAL_TABLET | ORAL | 0 refills | Status: DC | PRN
Start: 2021-12-25 — End: 2022-01-09
  Filled 2021-12-25: qty 10, 30d supply, fill #0

## 2021-12-25 MED ORDER — TOPIRAMATE 25 MG PO TABS
25.0000 mg | ORAL_TABLET | Freq: Two times a day (BID) | ORAL | 0 refills | Status: DC
  Filled 2021-12-25: qty 60, 30d supply, fill #0

## 2021-12-25 MED ORDER — CYCLOBENZAPRINE HCL 10 MG PO TABS
10.0000 mg | ORAL_TABLET | Freq: Every evening | ORAL | 0 refills | Status: DC | PRN
  Filled 2021-12-25: qty 7, 7d supply, fill #0
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
## 2021-12-26 ENCOUNTER — Other Ambulatory Visit (HOSPITAL_BASED_OUTPATIENT_CLINIC_OR_DEPARTMENT_OTHER): Payer: Self-pay

## 2021-12-27 ENCOUNTER — Other Ambulatory Visit (HOSPITAL_BASED_OUTPATIENT_CLINIC_OR_DEPARTMENT_OTHER): Payer: Self-pay

## 2021-12-31 ENCOUNTER — Emergency Department (HOSPITAL_BASED_OUTPATIENT_CLINIC_OR_DEPARTMENT_OTHER): Payer: BC Managed Care – PPO

## 2021-12-31 ENCOUNTER — Encounter (HOSPITAL_BASED_OUTPATIENT_CLINIC_OR_DEPARTMENT_OTHER): Payer: Self-pay | Admitting: Emergency Medicine

## 2021-12-31 ENCOUNTER — Other Ambulatory Visit: Payer: Self-pay

## 2021-12-31 ENCOUNTER — Emergency Department (HOSPITAL_BASED_OUTPATIENT_CLINIC_OR_DEPARTMENT_OTHER)
Admission: EM | Admit: 2021-12-31 | Discharge: 2021-12-31 | Disposition: A | Discharge status: Home / Self Care | Payer: BC Managed Care – PPO | Attending: Emergency Medicine | Admitting: Emergency Medicine

## 2021-12-31 DIAGNOSIS — R519 Headache, unspecified: Secondary | ICD-10-CM

## 2021-12-31 DIAGNOSIS — M545 Low back pain, unspecified: Secondary | ICD-10-CM | POA: Insufficient documentation

## 2021-12-31 DIAGNOSIS — M542 Cervicalgia: Secondary | ICD-10-CM | POA: Insufficient documentation

## 2021-12-31 DIAGNOSIS — Z91040 Latex allergy status: Secondary | ICD-10-CM | POA: Insufficient documentation

## 2021-12-31 DIAGNOSIS — Z79899 Other long term (current) drug therapy: Secondary | ICD-10-CM | POA: Insufficient documentation

## 2021-12-31 DIAGNOSIS — M47812 Spondylosis without myelopathy or radiculopathy, cervical region: Secondary | ICD-10-CM | POA: Diagnosis not present

## 2021-12-31 DIAGNOSIS — J45909 Unspecified asthma, uncomplicated: Secondary | ICD-10-CM | POA: Insufficient documentation

## 2021-12-31 DIAGNOSIS — M47816 Spondylosis without myelopathy or radiculopathy, lumbar region: Secondary | ICD-10-CM | POA: Diagnosis not present

## 2021-12-31 DIAGNOSIS — H538 Other visual disturbances: Secondary | ICD-10-CM | POA: Diagnosis not present

## 2021-12-31 MED ORDER — PROCHLORPERAZINE EDISYLATE 10 MG/2ML IJ SOLN
10.0000 mg | Freq: Once | INTRAMUSCULAR | Status: AC
  Administered 2021-12-31: 10 mg via INTRAVENOUS
  Filled 2021-12-31: qty 2

## 2021-12-31 MED ORDER — DIPHENHYDRAMINE HCL 50 MG/ML IJ SOLN
50.0000 mg | Freq: Once | INTRAMUSCULAR | Status: AC
  Administered 2021-12-31: 50 mg via INTRAVENOUS
  Filled 2021-12-31: qty 1

## 2021-12-31 MED ORDER — SODIUM CHLORIDE 0.9 % IV BOLUS
1000.0000 mL | Freq: Once | INTRAVENOUS | Status: AC
  Administered 2021-12-31: 1000 mL via INTRAVENOUS
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
## 2021-12-31 NOTE — ED Notes (Signed)
States she feels much better post IVF infusion and meds previously administered. Up to restroom, gait very steady, no neuro currently noted at this time. GCS 15 at this time.
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
## 2022-01-09 ENCOUNTER — Other Ambulatory Visit: Payer: Self-pay | Admitting: Medical

## 2022-01-10 ENCOUNTER — Other Ambulatory Visit (HOSPITAL_BASED_OUTPATIENT_CLINIC_OR_DEPARTMENT_OTHER): Payer: Self-pay

## 2022-01-10 MED ORDER — TOPIRAMATE 25 MG PO TABS
25.0000 mg | ORAL_TABLET | Freq: Two times a day (BID) | ORAL | 0 refills | Status: DC
  Filled 2022-01-10 – 2022-01-17 (×2): qty 60, 30d supply, fill #0

## 2022-01-10 MED ORDER — ZOLMITRIPTAN 5 MG PO TABS
5.0000 mg | ORAL_TABLET | ORAL | 0 refills | Status: AC | PRN
  Filled 2022-01-10 – 2022-05-08 (×2): qty 10, 30d supply, fill #0

## 2022-01-13 ENCOUNTER — Other Ambulatory Visit (HOSPITAL_BASED_OUTPATIENT_CLINIC_OR_DEPARTMENT_OTHER): Payer: Self-pay

## 2022-01-13 ENCOUNTER — Ambulatory Visit (INDEPENDENT_AMBULATORY_CARE_PROVIDER_SITE_OTHER): Payer: BC Managed Care – PPO | Admitting: Medical

## 2022-01-13 ENCOUNTER — Other Ambulatory Visit: Payer: Self-pay | Admitting: Medical

## 2022-01-13 VITALS — BP 136/66 | HR 66 | Resp 18 | Ht 66.5 in | Wt 233.0 lb

## 2022-01-13 DIAGNOSIS — G43701 Chronic migraine without aura, not intractable, with status migrainosus: Secondary | ICD-10-CM

## 2022-01-13 MED ORDER — CYCLOBENZAPRINE HCL 10 MG PO TABS
10.0000 mg | ORAL_TABLET | Freq: Every evening | ORAL | 0 refills | Status: DC | PRN
  Filled 2022-01-13: qty 7, 7d supply, fill #0

## 2022-01-13 MED ORDER — KETOROLAC TROMETHAMINE 30 MG/ML IJ SOLN
30.0000 mg | Freq: Once | INTRAMUSCULAR | Status: AC
  Administered 2022-01-13: 30 mg via INTRAMUSCULAR

## 2022-01-13 NOTE — Patient Instructions (Addendum)
Recent very frequent daily migraines. Some good response to zomig when treating early but your ha are daily. Topamax seemed not to prevent your ha. FMLA days exceeding over past 2 weeks.  You have disability form to be filled out. I think best to fill out after you see neurologist and after you try medication that neurologist recommends. Then fill out form.(Neurologist and or myself to fill out form).  Low level ha today. Toradol 30 mg IM today.   Work note written today. Explaining status and delay of your [place] disability paperwork  Follow up date to be determined response to potential new med regimen rx'd by neurologist.

## 2022-01-13 NOTE — Progress Notes (Signed)
Subjective:    Patient ID: Shirley [name], female    DOB: December 23, 1975, 46 y.o.   MRN: [id number]  HPI Pt in for follow up.  Pt states on past Thursday she had severe ha and vomiting. Pt states 12-30-21 severe ha and had to leave work. HA persisted on 12-31-21 and went to ED. IN ED she had negative ct of head and neck.   Since 01-01-2022 she had daily moderate to severe.   Today has low level ha presently. This morning took excedrin.   Pt states when she took zomig early would stop her ha but last 10-14 days did not have any zomig and ha were almost daily. She is also on topamax for prevention.    Pt states fiorcet did not help with HA's.   Pt is going to see neurologist on 01-15-2022.    Pt has short term disability form that was presented to me. Pt does not want to be on disability.  Pt has had fmla form already filled out.      Review of Systems  Constitutional:  Negative for fatigue and fever.  Respiratory:  Negative for cough, shortness of breath and wheezing.   Cardiovascular:  Negative for chest pain and palpitations.  Gastrointestinal:  Negative for abdominal pain.  Endocrine: Negative for polydipsia and polyphagia.  Musculoskeletal:  Negative for back pain, myalgias and neck stiffness.  Skin:  Negative for rash.  Neurological:  Positive for headaches.  Hematological:  Negative for adenopathy. Does not bruise/bleed easily.  Psychiatric/Behavioral:  Negative for behavioral problems and sleep disturbance.     Past Medical History:  Diagnosis Date   Asthma    Lumbar back pain    Migraine    Stress    Uterine fibroids affecting pregnancy    reports having a surgical procedure -- unsure what     Social History   Socioeconomic History   Marital status: Single    Spouse name: Not on file   Number of children: Not on file   Years of education: Not on file   Highest education level: Not on file  Occupational History   Occupation: Teacher, English as a foreign language   Tobacco Use   Smoking status: Former    Years: 25.00    Types: Cigarettes    Quit date: 12/06/2018    Years since quitting: 3.1   Smokeless tobacco: Never  Vaping Use   Vaping Use: Never used  Substance and Sexual Activity   Alcohol use: No   Drug use: No   Sexual activity: Yes    Birth control/protection: Surgical  Other Topics Concern   Not on file  Social History Narrative   Not on file   Social Determinants of Health   Financial Resource Strain: Not on file  Food Insecurity: Not on file  Transportation Needs: Not on file  Physical Activity: Not on file  Stress: Not on file  Social Connections: Not on file  Intimate Partner Violence: Not on file    Past Surgical History:  Procedure Laterality Date   ABDOMINAL HYSTERECTOMY     ABDOMINAL HYSTERECTOMY     MYOMECTOMY     TONSILLECTOMY      Family History  Problem Relation Age of Onset   Ataxia Neg Hx    Chorea Neg Hx    Dementia Neg Hx    Mental retardation Neg Hx    Migraines Neg Hx    Multiple sclerosis Neg Hx    Neurofibromatosis Neg Hx  Neuropathy Neg Hx    Parkinsonism Neg Hx    Seizures Neg Hx    Stroke Neg Hx     Allergies  Allergen Reactions   Other Anaphylaxis    IV CT Scan Contrast   Isovue [Iopamidol] Itching    Eye redness/swelling and itching post contrast, hives on upper back   Latex Rash    Current Outpatient Medications on File Prior to Visit  Medication Sig Dispense Refill   butalbital-acetaminophen-caffeine (FIORICET) 50-325-40 MG tablet Take 1 tablet by mouth every 6 (six) hours as needed for headache. 14 tablet 0   cyclobenzaprine (FLEXERIL) 10 MG tablet Take 1 tablet (10 mg total) by mouth at bedtime as needed for severe headache with trapezius pain. 7 tablet 0   diclofenac (VOLTAREN) 75 MG EC tablet Take 1 tablet (75 mg total) by mouth 2 (two) times daily. 14 tablet 0   gabapentin (NEURONTIN) 300 MG capsule Take 300 mg by mouth 3 (three) times daily.     topiramate (TOPAMAX)  25 MG tablet Take 1 tablet (25 mg total) by mouth 2 (two) times daily. 60 tablet 0   zolmitriptan (ZOMIG) 5 MG tablet Take 1 tablet (5 mg total) by mouth as needed for migraine. Repeat in 2 hours if needed. Max 2 tablets in 24 hours. 10 tablet 0   No current facility-administered medications on file prior to visit.    BP 136/66   Pulse 66   Resp 18   Ht 5' 6.5" (1.689 m)   Wt 233 lb (105.7 kg)   LMP 09/01/2014   SpO2 97%   BMI 37.04 kg/m        Objective:   Physical Exam  General Mental Status- Alert. General Appearance- Not in acute distress.   Skin General: Color- Normal Color. Moisture- Normal Moisture.  Neck Carotid Arteries- Normal color. Moisture- Normal Moisture. No carotid bruits. No JVD.  Chest and Lung Exam Auscultation: Breath Sounds:-Normal.  Cardiovascular Auscultation:Rythm- Regular. Murmurs & Other Heart Sounds:Auscultation of the heart reveals- No Murmurs.   Neurologic Cranial Nerve exam:- CN III-XII intact(No nystagmus), symmetric smile. Drift Test:- No drift. Romberg Exam:- Negative.  Heal to Toe Gait exam:-Normal. Finger to Nose:- Normal/Intact Strength:- 5/5 equal and symmetric strength both upper and lower extremities.       Assessment & Plan:   Patient Instructions  Recent very frequent daily migraines. Some good response to zomig when treating early but your ha are daily. Topamax seemed not to prevent your ha. FMLA days exceeding over past 2 weeks.  You have disability form to be filled out. I think best to fill out after you see neurologist and after you try medication that neurologist recommends. Then fill out form.(Neurologist and or myself to fill out form).  Low level ha today. Toradol 30 mg IM today.   Work note written today. Explaining status and delay of your [employer] disability paperwork  Follow up date to be determined response to potential new med regimen rx'd by neurologist.      Mackie Pai, PA-C

## 2022-01-13 NOTE — Addendum Note (Signed)
Addended by: Jeronimo Greaves on: 01/13/2022 01:50 PM   Modules accepted: Orders

## 2022-01-15 ENCOUNTER — Encounter: Payer: Self-pay | Admitting: Neurology

## 2022-01-15 ENCOUNTER — Ambulatory Visit (INDEPENDENT_AMBULATORY_CARE_PROVIDER_SITE_OTHER): Payer: BC Managed Care – PPO | Admitting: Neurology

## 2022-01-15 VITALS — BP 135/91 | HR 75 | Ht 66.0 in | Wt 237.0 lb

## 2022-01-15 DIAGNOSIS — G4440 Drug-induced headache, not elsewhere classified, not intractable: Secondary | ICD-10-CM

## 2022-01-15 DIAGNOSIS — G43711 Chronic migraine without aura, intractable, with status migrainosus: Secondary | ICD-10-CM | POA: Insufficient documentation

## 2022-01-15 MED ORDER — METHYLPREDNISOLONE 4 MG PO TBPK: ORAL_TABLET | ORAL | 1 refills | Status: DC

## 2022-01-15 MED ORDER — AJOVY 225 MG/1.5ML ~~LOC~~ SOAJ: 225.0000 mg | SUBCUTANEOUS | 0 refills | Status: DC

## 2022-01-15 NOTE — Progress Notes (Signed)
[facility]    Provider:  Dr Jaynee Eagles Requesting Provider: Elise Benne Primary Care Provider:  Mackie Pai, PA-C  CC:  01/15/2022  HPI:  Shirley [name] is a 46 y.o. female here as requested by Mackie Pai, PA-C for migraines. Migraines started at the age of 54, her aunt had migraines. She has daily migraines. She is using excedrin and goes through a big bottle every 2 weeks. Migraines lasting 24 hours every day. They are pulsating/pounding/throbbing, photophobia/phonophobia,nausea, hurts to move, nausea, dizziness. Stress can make them worse, not sleeping. Excedrin helps temporarily. Not positional or exertional no vision changes. She is on medical leave, being on computer screen can make it worse. No other focal neurologic deficits, associated symptoms, inciting events or modifiable factors.   Reviewed notes, labs and imaging from outside physicians, which showed:  From a thorough review of records, medications tried that can be used in migraine and headache management include: Tylenol, amlodipine, aspirin, Fioricet, Flexeril, Decadron, Benadryl, gabapentin, ibuprofen, ketorolac, Reglan, Robaxin, Naprosyn, nortriptyline, sumatriptan, Topamax, zolmitriptan  12/31/2021: EXAM: CT HEAD WITHOUT CONTRAST   CT CERVICAL SPINE WITHOUT CONTRAST   TECHNIQUE: Multidetector CT imaging of the head and cervical spine was performed following the standard protocol without intravenous contrast. Multiplanar CT image reconstructions of the cervical spine were also generated.   RADIATION DOSE REDUCTION: This exam was performed according to the departmental dose-optimization program which includes automated exposure control, adjustment of the mA and/or kV according to patient size and/or use of iterative reconstruction technique.   COMPARISON:  Head CT 12/08/2018.   FINDINGS: CT HEAD FINDINGS   Brain: There is no evidence of an acute infarct,  intracranial hemorrhage, mass, midline shift, or extra-axial fluid collection. The ventricles and sulci are normal.   Vascular: No hyperdense vessel.   Skull: No fracture or suspicious osseous lesion.   Sinuses/Orbits: Paranasal sinuses and mastoid air cells are clear. Unremarkable orbits.   Other: None.   CT CERVICAL SPINE FINDINGS   Alignment: Cervical spine straightening. Trace retrolisthesis of C3 on C4, likely degenerative.   Skull base and vertebrae: No acute fracture or suspicious osseous lesion.   Soft tissues and spinal canal: No prevertebral fluid or swelling. No visible canal hematoma.   Disc levels: Mild cervical spondylosis with degenerative endplate spurring being most notable at C5-6 and C6-7. No evidence of high-grade stenosis.   Upper chest: Clear lung apices.   Other: None.   IMPRESSION: No evidence of acute intracranial abnormality or cervical spine fracture.  Review of Systems: Patient complains of symptoms per HPI as well as the following symptoms migraines. Pertinent negatives and positives per HPI. All others negative.   Social History   Socioeconomic History   Marital status: Single    Spouse name: Not on file   Number of children: 2   Years of education: Not on file   Highest education level: Not on file  Occupational History   Occupation: Teacher, English as a foreign language  Tobacco Use   Smoking status: Former    Years: 25.00    Types: Cigarettes    Quit date: 12/06/2018    Years since quitting: 3.1   Smokeless tobacco: Never  Vaping Use   Vaping Use: Never used  Substance and Sexual Activity   Alcohol use: No   Drug use: No   Sexual activity: Yes    Birth control/protection: Surgical  Other Topics Concern   Not on file  Social History Narrative   Lives with 59 yr old child  Reports being ambidextrous    Caffeine: coffee 8 cups/day, soda 20 oz x 20 per day ("drink like water")   Social Determinants of Health   Financial Resource  Strain: Not on file  Food Insecurity: Not on file  Transportation Needs: Not on file  Physical Activity: Not on file  Stress: Not on file  Social Connections: Not on file  Intimate Partner Violence: Not on file    Family History  Problem Relation Age of Onset   Pancreatic cancer Father    Bipolar disorder Sister    Liver cancer Sister    Schizophrenia Sister    Epilepsy Sister    Migraines Maternal Aunt    Kidney cancer Maternal Uncle    Alcoholism Maternal Uncle    Schizophrenia Paternal Aunt    Bipolar disorder Paternal Aunt    Schizophrenia Son    Ataxia Neg Hx    Chorea Neg Hx    Dementia Neg Hx    Mental retardation Neg Hx    Multiple sclerosis Neg Hx    Neurofibromatosis Neg Hx    Neuropathy Neg Hx    Parkinsonism Neg Hx    Seizures Neg Hx    Stroke Neg Hx     Past Medical History:  Diagnosis Date   Anger    "explosive temper disorder"   Anxiety    Asthma    Depression    "extreme depression"   Lumbar back pain    Migraine    Stress    Uterine fibroids affecting pregnancy    reports having a surgical procedure -- unsure what    Patient Active Problem List   Diagnosis Date Noted   Chronic migraine without aura, with intractable migraine, so stated, with status migrainosus 01/15/2022   Syncope 12/10/2018   Obesity, Class III, BMI 40-49.9 (morbid obesity) ([place]) 12/10/2018   Upper and lower extremity pain 03/16/2016   Lumbar radiculopathy 06/11/2015   Medication overuse headache 06/02/2013   Essential hypertension 06/02/2013    Past Surgical History:  Procedure Laterality Date   ABDOMINAL HYSTERECTOMY     ABDOMINAL HYSTERECTOMY     pt states she cannot confirm or deny that she had this pressure   MYOMECTOMY     TONSILLECTOMY      Current Outpatient Medications  Medication Sig Dispense Refill   butalbital-acetaminophen-caffeine (FIORICET) 50-325-40 MG tablet Take 1 tablet by mouth every 6 (six) hours as needed for headache. 14 tablet 0    cyclobenzaprine (FLEXERIL) 10 MG tablet Take 1 tablet (10 mg total) by mouth at bedtime as needed for severe headache with trapezius pain. 7 tablet 0   Fremanezumab-vfrm (AJOVY) 225 MG/1.5ML SOAJ Inject 225 mg into the skin every 30 (thirty) days. 4.5 mL 0   methylPREDNISolone (MEDROL DOSEPAK) 4 MG TBPK tablet Take pills daily all together with food. Take the first dose (6 pills) as soon as possible. Take the rest each morning. For 6 days total 6-5-4-3-2-1. 21 tablet 1   topiramate (TOPAMAX) 25 MG tablet Take 1 tablet (25 mg total) by mouth 2 (two) times daily. 60 tablet 0   zolmitriptan (ZOMIG) 5 MG tablet Take 1 tablet (5 mg total) by mouth as needed for migraine. Repeat in 2 hours if needed. Max 2 tablets in 24 hours. 10 tablet 0   No current facility-administered medications for this visit.    Allergies as of 01/15/2022 - Review Complete 01/15/2022  Allergen Reaction Noted   Other Anaphylaxis 11/15/2021   Isovue [iopamidol] Itching 01/25/2016  Latex Rash 12/09/2012    Vitals: BP (!) 135/91 (BP Location: Left Arm, Patient Position: Sitting, Cuff Size: Large)   Pulse 75   Ht '5\' 6"'$  (1.676 m)   Wt 237 lb (107.5 kg)   LMP 09/01/2014   BMI 38.25 kg/m  Last Weight:  Wt Readings from Last 1 Encounters:  01/15/22 237 lb (107.5 kg)   Last Height:   Ht Readings from Last 1 Encounters:  01/15/22 '5\' 6"'$  (1.676 m)     Physical exam: Exam: Gen: NAD, conversant, well nourised, obese, well groomed                     CV: RRR, no MRG. No Carotid Bruits. No peripheral edema, warm, nontender Eyes: Conjunctivae clear without exudates or hemorrhage  Neuro: Detailed Neurologic Exam  Speech:    Speech is normal; fluent and spontaneous with normal comprehension.  Cognition:    The patient is oriented to person, place, and time;     recent and remote memory intact;     language fluent;     normal attention, concentration,     fund of knowledge Cranial Nerves:    The pupils are equal,  round, and reactive to light. The fundi are normal and spontaneous venous pulsations are present. Visual fields are full to finger confrontation. Extraocular movements are intact. Trigeminal sensation is intact and the muscles of mastication are normal. The face is symmetric. The palate elevates in the midline. Hearing intact. Voice is normal. Shoulder shrug is normal. The tongue has normal motion without fasciculations.   Coordination:    Normal   Gait:    normal.   Motor Observation:    No asymmetry, no atrophy, and no involuntary movements noted. Tone:    Normal muscle tone.    Posture:    Posture is normal. normal erect    Strength:    Strength is V/V in the upper and lower limbs.      Sensation: intact to LT     Reflex Exam:  DTR's:    Deep tendon reflexes in the upper and lower extremities are symmetrical bilaterally.   Toes:    The toes are downgoing bilaterally.   Clonus:    Clonus is absent.    Assessment/Plan:  Patient with chronic migraines and medication overuse  - Ajovy monthly injected today - next 12/1 then 1/1, keep in fridge. Gave her 3 samples, we will see her back in - January and if effective we can prescribe, she may be going through some financial difficulties - We will bring you to infusion center today for migraine cocktail for 7/10 migraine - we filled out paperwork and brought her then she declined, said she had to go - Stop using excedrin, discussed medication overuse and rebound   Orders Placed This Encounter  Procedures   CBC with Differential/Platelets   Comprehensive metabolic panel   TSH Rfx on Abnormal to Free T4   Meds ordered this encounter  Medications   Fremanezumab-vfrm (AJOVY) 225 MG/1.5ML SOAJ    Sig: Inject 225 mg into the skin every 30 (thirty) days.    Dispense:  4.5 mL    Refill:  0   methylPREDNISolone (MEDROL DOSEPAK) 4 MG TBPK tablet    Sig: Take pills daily all together with food. Take the first dose (6 pills) as soon  as possible. Take the rest each morning. For 6 days total 6-5-4-3-2-1.    Dispense:  21 tablet    Refill:  1   To prevent or relieve headaches, try the following: Cool Compress. Lie down and place a cool compress on your head.  Avoid headache triggers. If certain foods or odors seem to have triggered your migraines in the past, avoid them. A headache diary might help you identify triggers.  Include physical activity in your daily routine. Try a daily walk or other moderate aerobic exercise.  Manage stress. Find healthy ways to cope with the stressors, such as delegating tasks on your to-do list.  Practice relaxation techniques. Try deep breathing, yoga, massage and visualization.  Eat regularly. Eating regularly scheduled meals and maintaining a healthy diet might help prevent headaches. Also, drink plenty of fluids.  Follow a regular sleep schedule. Sleep deprivation might contribute to headaches Consider biofeedback. With this mind-body technique, you learn to control certain bodily functions -- such as muscle tension, heart rate and blood pressure -- to prevent headaches or reduce headache pain.    Proceed to emergency room if you experience new or worsening symptoms or symptoms do not resolve, if you have new neurologic symptoms or if headache is severe, or for any concerning symptom.   Provided education and documentation from American headache Society toolbox including articles on: chronic migraine medication overuse headache, chronic migraines, prevention of migraines, behavioral and other nonpharmacologic treatments for headache.  Cc: Saguier, Iris Pert,  Saguier, Percell Miller, PA-C  Sarina Ill, MD  [facility] [address] [place], [place] [address]  Phone [phone] Fax [phone]  I spent 60 minutes of face-to-face and non-face-to-face time with patient on the  1. Chronic migraine without aura, with intractable migraine, so stated, with  status migrainosus   2. Medication overuse headache    diagnosis.  This included previsit chart review, lab review, study review, order entry, electronic health record documentation, patient education on the different diagnostic and therapeutic options, counseling and coordination of care, risks and benefits of management, compliance, or risk factor reduction

## 2022-01-15 NOTE — Patient Instructions (Addendum)
Ajovy monthly - next 12/1 then 1/1, keep in fridge We will bring you to infusion center today Stop using excedrin(see below) Will try and get you nurtec or ubrelvy samples to use instead of excedrin   Analgesic Rebound Headache An analgesic rebound headache, sometimes called a medication overuse headache or a drug-induced headache, is a secondary disorder that is caused by the overuse of pain medicine (analgesic) to treat the original (primary) headache. Any type of primary headache can return as a rebound headache if a person regularly takes analgesics. The types of primary headaches that are commonly associated with rebound headaches include: Migraines. Headaches that are caused by tense muscles in the head and neck area (tension headaches). Headaches that develop and happen again on one side of the head and around the eye (cluster headaches). If rebound headaches continue, they can become long-term, daily headaches. What are the causes? This condition may be caused by frequent use of: Over-the-counter medicines such as aspirin, ibuprofen, and acetaminophen. Sinus-relief medicines and medicines that contain caffeine. Narcotic pain medicines such as codeine and oxycodone. Some prescription migraine medicines. What are the signs or symptoms? The symptoms of a rebound headache are the same as the symptoms of the original headache. Some of the symptoms of specific types of headaches include: Migraine headache Pulsing or throbbing pain on one or both sides of the head. Severe pain that interferes with daily activities. Pain that gets worse with physical activity. Nausea, vomiting, or both. Pain and sensitivity with exposure to bright light, loud noises, or strong smells. Visual changes. Numbness of one or both arms. Tension headache Pressure around the head. Dull, aching head pain. Pain felt over the front and sides of the head. Tenderness in the muscles of the head, neck, and  shoulders. Cluster headache Severe pain that begins in or around one eye or temple. Droopy or swollen eyelid, or redness and tearing in the eye on the same side as the pain. One-sided head pain. Nausea. Runny nose. Sweaty, pale facial skin. Restlessness. How is this diagnosed? This condition is diagnosed by: Reviewing your medical history. This includes the nature of your primary headaches. Reviewing the types of pain medicines that you have been using to treat your primary headaches and how often you take them. How is this treated? This condition may be treated or managed by: Discontinuing frequent use of the analgesic medicine. Doing this may worsen your headaches at first, but the pain should eventually become more manageable, less frequent, and less severe. Seeing a headache specialist. He or she may be able to help you manage your headaches and help make sure there is not another cause of the headaches. Using methods of stress relief, such as acupuncture, counseling, biofeedback, and massage. Talk with your health care provider about which methods might be good for you. Follow these instructions at home: Medicines  Take over-the-counter and prescription medicines only as told by your health care provider. Stop the repeated use of pain medicine as told by your health care provider. Stopping can be difficult. Carefully follow instructions from your health care provider. Lifestyle Follow a regular sleep schedule. Do not vary the time that you go to bed or the amount that you sleep from day to day. It is important to stay on the same schedule to help prevent headaches. Get 7-9 hours of sleep each night, or the amount recommended by your health care provider. Exercise regularly. Exercise for at least 30 minutes, 5 times each week. Limit or manage  stress. Consider stress-relief options such as acupuncture, counseling, biofeedback, and massage. Talk with your health care provider about which  methods might be good for you. Do not drink alcohol. Do not use any products that contain nicotine or tobacco, such as cigarettes, e-cigarettes, and chewing tobacco. If you need help quitting, ask your health care provider. General instructions Avoid triggers that are known to cause your primary headaches. Keep all follow-up visits as told by your health care provider. This is important. Contact a health care provider if: You continue to experience headaches after following treatments that your health care provider recommended. Get help right away if you have: New headache pain. Headache pain that is different than what you have experienced in the past. Numbness or tingling in your arms or legs. Changes in your speech or vision. Summary An analgesic rebound headache, sometimes called a medication overuse headache or a drug-induced headache, is caused by the overuse of pain medicine (analgesic) to treat the original (primary) headache. Any type of primary headache can return as a rebound headache if a person regularly takes analgesics. The types of primary headaches that are commonly associated with rebound headaches include migraines, tension headaches, and cluster headaches. Analgesic rebound headaches can occur with frequent use of over-the-counter medicines and prescription medicines. Treatment involves stopping the medicine that is being overused. This will improve headache frequency and severity. This information is not intended to replace advice given to you by your health care provider. Make sure you discuss any questions you have with your health care provider. Document Revised: 08/15/2021 Document Reviewed: 03/31/2019 Elsevier Patient Education  [place] Injection What is this medication? FREMANEZUMAB (fre ma NEZ ue mab) prevents migraines. It works by blocking a substance in the body that causes migraines. It is a monoclonal antibody. This medicine may be  used for other purposes; ask your health care provider or pharmacist if you have questions. COMMON BRAND NAME(S): AJOVY What should I tell my care team before I take this medication? They need to know if you have any of these conditions: An unusual or allergic reaction to fremanezumab, other medications, foods, dyes, or preservatives Pregnant or trying to get pregnant Breast-feeding How should I use this medication? This medication is injected under the skin. You will be taught how to prepare and give it. Take it as directed on the prescription label. Keep taking it unless your care team tells you to stop. It is important that you put your used needles and syringes in a special sharps container. Do not put them in a trash can. If you do not have a sharps container, call your pharmacist or care team to get one. Talk to your care team about the use of this medication in children. Special care may be needed. Overdosage: If you think you have taken too much of this medicine contact a poison control center or emergency room at once. NOTE: This medicine is only for you. Do not share this medicine with others. What if I miss a dose? If you miss a dose, take it as soon as you can. If it is almost time for your next dose, take only that dose. Do not take double or extra doses. What may interact with this medication? Interactions are not expected. This list may not describe all possible interactions. Give your health care provider a list of all the medicines, herbs, non-prescription drugs, or dietary supplements you use. Also tell them if you smoke, drink alcohol, or use illegal  drugs. Some items may interact with your medicine. What should I watch for while using this medication? Tell your care team if your symptoms do not start to get better or if they get worse. What side effects may I notice from receiving this medication? Side effects that you should report to your care team as soon as  possible: Allergic reactions or angioedema--skin rash, itching or hives, swelling of the face, eyes, lips, tongue, arms, or legs, trouble swallowing or breathing Side effects that usually do not require medical attention (report to your care team if they continue or are bothersome): Pain, redness, or irritation at injection site This list may not describe all possible side effects. Call your doctor for medical advice about side effects. You may report side effects to FDA at 1-800-FDA-1088. Where should I keep my medication? Keep out of the reach of children and pets. Store in a refrigerator or at room temperature between 20 and 25 degrees C (68 and 77 degrees F). Refrigeration (preferred): Store in the refrigerator. Do not freeze. Keep in the original container until you are ready to take it. Remove the dose from the carton about 30 minutes before it is time for you to use it. If the dose is not used, it may be stored in the original container at room temperature for 7 days. Get rid of any unused medication after the expiration date. Room Temperature: This medication may be stored at room temperature for up to 7 days. Keep it in the original container. Protect from light until time of use. If it is stored at room temperature, get rid of any unused medication after 7 days or after it expires, whichever is first. To get rid of medications that are no longer needed or have expired: Take the medication to a medication take-back program. Check with your pharmacy or law enforcement to find a location. If you cannot return the medication, ask your pharmacist or care team how to get rid of this medication safely. NOTE: This sheet is a summary. It may not cover all possible information. If you have questions about this medicine, talk to your doctor, pharmacist, or health care provider.  2023 Elsevier/Gold Standard (2016-12-02 00:00:00)

## 2022-01-16 LAB — CBC WITH DIFFERENTIAL/PLATELET
Basophils Absolute: 0.1 10*3/uL (ref 0.0–0.2)
Basos: 1 %
EOS (ABSOLUTE): 0.1 10*3/uL (ref 0.0–0.4)
Eos: 1 %
Hematocrit: 41.6 % (ref 34.0–46.6)
Hemoglobin: 14.3 g/dL (ref 11.1–15.9)
Immature Grans (Abs): 0 10*3/uL (ref 0.0–0.1)
Immature Granulocytes: 0 %
Lymphocytes Absolute: 3.1 10*3/uL (ref 0.7–3.1)
Lymphs: 32 %
MCH: 28.1 pg (ref 26.6–33.0)
MCHC: 34.4 g/dL (ref 31.5–35.7)
MCV: 82 fL (ref 79–97)
Monocytes Absolute: 0.6 10*3/uL (ref 0.1–0.9)
Monocytes: 6 %
Neutrophils Absolute: 6 10*3/uL (ref 1.4–7.0)
Neutrophils: 60 %
Platelets: 297 10*3/uL (ref 150–450)
RBC: 5.09 x10E6/uL (ref 3.77–5.28)
RDW: 13.2 % (ref 11.7–15.4)
WBC: 9.9 10*3/uL (ref 3.4–10.8)

## 2022-01-16 LAB — COMPREHENSIVE METABOLIC PANEL
ALT: 26 IU/L (ref 0–32)
AST: 20 IU/L (ref 0–40)
Albumin/Globulin Ratio: 1.8 (ref 1.2–2.2)
Albumin: 4.8 g/dL (ref 3.9–4.9)
Alkaline Phosphatase: 116 IU/L (ref 44–121)
BUN/Creatinine Ratio: 10 (ref 9–23)
BUN: 10 mg/dL (ref 6–24)
Bilirubin Total: 0.2 mg/dL (ref 0.0–1.2)
CO2: 25 mmol/L (ref 20–29)
Calcium: 9.6 mg/dL (ref 8.7–10.2)
Chloride: 106 mmol/L (ref 96–106)
Creatinine, Ser: 0.99 mg/dL (ref 0.57–1.00)
Globulin, Total: 2.7 g/dL (ref 1.5–4.5)
Glucose: 83 mg/dL (ref 70–99)
Potassium: 4.2 mmol/L (ref 3.5–5.2)
Sodium: 143 mmol/L (ref 134–144)
Total Protein: 7.5 g/dL (ref 6.0–8.5)
eGFR: 71 mL/min/{1.73_m2} (ref >59)

## 2022-01-16 LAB — TSH RFX ON ABNORMAL TO FREE T4: TSH: 1.35 u[IU]/mL (ref 0.450–4.500)

## 2022-01-17 ENCOUNTER — Other Ambulatory Visit (HOSPITAL_BASED_OUTPATIENT_CLINIC_OR_DEPARTMENT_OTHER): Payer: Self-pay

## 2022-02-03 ENCOUNTER — Other Ambulatory Visit: Payer: Self-pay | Admitting: Medical

## 2022-02-03 ENCOUNTER — Other Ambulatory Visit (HOSPITAL_BASED_OUTPATIENT_CLINIC_OR_DEPARTMENT_OTHER): Payer: Self-pay

## 2022-02-03 MED ORDER — TOPIRAMATE 25 MG PO TABS
25.0000 mg | ORAL_TABLET | Freq: Two times a day (BID) | ORAL | 0 refills | Status: DC
  Filled 2022-02-03: qty 60, 30d supply, fill #0

## 2022-03-03 ENCOUNTER — Other Ambulatory Visit (HOSPITAL_BASED_OUTPATIENT_CLINIC_OR_DEPARTMENT_OTHER): Payer: Self-pay

## 2022-03-03 ENCOUNTER — Ambulatory Visit (INDEPENDENT_AMBULATORY_CARE_PROVIDER_SITE_OTHER): Payer: BC Managed Care – PPO | Admitting: Medical

## 2022-03-03 VITALS — BP 130/78 | HR 69 | Resp 18 | Ht 66.0 in | Wt 239.2 lb

## 2022-03-03 DIAGNOSIS — G43711 Chronic migraine without aura, intractable, with status migrainosus: Secondary | ICD-10-CM | POA: Diagnosis not present

## 2022-03-03 MED ORDER — CYCLOBENZAPRINE HCL 5 MG PO TABS
5.0000 mg | ORAL_TABLET | Freq: Every evening | ORAL | 0 refills | Status: DC | PRN
  Filled 2022-03-03: qty 5, 5d supply, fill #0

## 2022-03-03 MED ORDER — KETOROLAC TROMETHAMINE 60 MG/2ML IM SOLN
60.0000 mg | Freq: Once | INTRAMUSCULAR | Status: AC
  Administered 2022-03-03: 60 mg via INTRAMUSCULAR

## 2022-03-03 NOTE — Patient Instructions (Addendum)
Recent ha that was partially migraine but also your rt trapezius tenderness presently indicated maybe component of tension ha. No motor or sensory deficits.  We gave toradol 60 mg injection today. Also will prescribe flexeril 5 mg to use at night prn trapezius pain. Rx advisement given.  Overall sound like Ajovy has helped decreased frequency of your ha. Hopefully current HA will resolve. If does stop ha completely let me know. If worsens/severe then advise ED evaluation.  Follow up early January for next Ajovy and keep appointment with Dr. Jaynee Eagles

## 2022-03-03 NOTE — Addendum Note (Signed)
Addended by: Kem Boroughs D on: 03/03/2022 12:22 PM   Modules accepted: Orders

## 2022-03-03 NOTE — Progress Notes (Signed)
Subjective:    Patient ID: Shirley [name], female    DOB: 1975/10/19, 46 y.o.   MRN: [id number]  HPI  Pt states this past Friday got HA. She states she was doing some intensive training in new dept at work. Finished training on Thursday. Then on Friday at work got severe rt side headache. She had some blurred vision transient and states had to leave work. Pt states she still feels light senstivite. She notes since then rt sided neck pain. No sound sensitivity. No nause or vomiting.    Pt has seen neurologist in early 01-15-2022. A/P of neurologist below in ".  "Assessment/Plan:  Patient with chronic migraines and medication overuse   - Ajovy monthly injected today - next 12/1 then 1/1, keep in fridge. Gave her 3 samples, we will see her back in - January and if effective we can prescribe, she may be going through some financial difficulties - We will bring you to infusion center today for migraine cocktail for 7/10 migraine - we filled out paperwork and brought her then she declined, said she had to go - Stop using excedrin, discussed medication overuse and rebound"  Pt states for past 6 weeks she has not had any migraine headache. Pt had second injection early this month. Scheduled for 2nd injection Mar 17, 2021. Will see neurologist.   No medication taken since Friday for headache.  Pt still has ha level 5-6/10. On Friday 8-9/10   Review of Systems  Constitutional:  Negative for chills, fatigue and fever.  Eyes:  Positive for photophobia. Negative for visual disturbance.       Some transient blurred vision on Friday. None presently.  Respiratory:  Negative for cough, chest tightness and wheezing.   Cardiovascular:  Negative for palpitations.  Gastrointestinal:  Negative for abdominal pain.  Musculoskeletal:        Rt side trapezius pain.  Skin:  Negative for rash.  Neurological:  Positive for headaches. Negative for dizziness, syncope, speech difficulty, weakness and  light-headedness.    Past Medical History:  Diagnosis Date   Anger    "explosive temper disorder"   Anxiety    Asthma    Depression    "extreme depression"   Lumbar back pain    Migraine    Stress    Uterine fibroids affecting pregnancy    reports having a surgical procedure -- unsure what     Social History   Socioeconomic History   Marital status: Single    Spouse name: Not on file   Number of children: 2   Years of education: Not on file   Highest education level: Not on file  Occupational History   Occupation: Teacher, English as a foreign language  Tobacco Use   Smoking status: Former    Years: 25.00    Types: Cigarettes    Quit date: 12/06/2018    Years since quitting: 3.2   Smokeless tobacco: Never  Vaping Use   Vaping Use: Never used  Substance and Sexual Activity   Alcohol use: No   Drug use: No   Sexual activity: Yes    Birth control/protection: Surgical  Other Topics Concern   Not on file  Social History Narrative   Lives with 71 yr old child    Reports being ambidextrous    Caffeine: coffee 8 cups/day, soda 20 oz x 20 per day ("drink like water")   Social Determinants of Health   Financial Resource Strain: Not on file  Food Insecurity: Not on file  Transportation Needs: Not on file  Physical Activity: Not on file  Stress: Not on file  Social Connections: Not on file  Intimate Partner Violence: Not on file    Past Surgical History:  Procedure Laterality Date   ABDOMINAL HYSTERECTOMY     ABDOMINAL HYSTERECTOMY     pt states she cannot confirm or deny that she had this pressure   MYOMECTOMY     TONSILLECTOMY      Family History  Problem Relation Age of Onset   Pancreatic cancer Father    Bipolar disorder Sister    Liver cancer Sister    Schizophrenia Sister    Epilepsy Sister    Migraines Maternal Aunt    Kidney cancer Maternal Uncle    Alcoholism Maternal Uncle    Schizophrenia Paternal Aunt    Bipolar disorder Paternal Aunt    Schizophrenia Son     Ataxia Neg Hx    Chorea Neg Hx    Dementia Neg Hx    Mental retardation Neg Hx    Multiple sclerosis Neg Hx    Neurofibromatosis Neg Hx    Neuropathy Neg Hx    Parkinsonism Neg Hx    Seizures Neg Hx    Stroke Neg Hx     Allergies  Allergen Reactions   Other Anaphylaxis    IV CT Scan Contrast   Isovue [Iopamidol] Itching    Eye redness/swelling and itching post contrast, hives on upper back   Latex Rash    Current Outpatient Medications on File Prior to Visit  Medication Sig Dispense Refill   butalbital-acetaminophen-caffeine (FIORICET) 50-325-40 MG tablet Take 1 tablet by mouth every 6 (six) hours as needed for headache. 14 tablet 0   cyclobenzaprine (FLEXERIL) 10 MG tablet Take 1 tablet (10 mg total) by mouth at bedtime as needed for severe headache with trapezius pain. 7 tablet 0   Fremanezumab-vfrm (AJOVY) 225 MG/1.5ML SOAJ Inject 225 mg into the skin every 30 (thirty) days. 4.5 mL 0   methylPREDNISolone (MEDROL DOSEPAK) 4 MG TBPK tablet Take pills daily all together with food. Take the first dose (6 pills) as soon as possible. Take the rest each morning. For 6 days total 6-5-4-3-2-1. 21 tablet 1   topiramate (TOPAMAX) 25 MG tablet Take 1 tablet (25 mg total) by mouth 2 (two) times daily. 60 tablet 0   zolmitriptan (ZOMIG) 5 MG tablet Take 1 tablet (5 mg total) by mouth as needed for migraine. Repeat in 2 hours if needed. Max 2 tablets in 24 hours. 10 tablet 0   No current facility-administered medications on file prior to visit.    BP 130/78 Comment: 120/70 before last check.  Pulse 69   Resp 18   Ht '5\' 6"'$  (1.676 m)   Wt 239 lb 3.2 oz (108.5 kg)   SpO2 99%   BMI 38.61 kg/m        Objective:   Physical Exam  General Mental Status- Alert. General Appearance- Not in acute distress.   Skin General: Color- Normal Color. Moisture- Normal Moisture.  Neck Carotid Arteries- Normal color. Moisture- Normal Moisture. No carotid bruits. No JVD. Rt side trapezius very  tender to palpatoin throughout.   Chest and Lung Exam Auscultation: Breath Sounds:-Normal.  Cardiovascular Auscultation:Rythm- Regular. Murmurs & Other Heart Sounds:Auscultation of the heart reveals- No Murmurs.  Abdomen Inspection:-Inspeection Normal. Palpation/Percussion:Note:No mass. Palpation and Percussion of the abdomen reveal- Non Tender, Non Distended + BS, no rebound or guarding.    Neurologic Cranial Nerve exam:- CN  III-XII intact(No nystagmus), symmetric smile. Drift Test:- No drift. Romberg Exam:- Negative.  Heal to Toe Gait exam:-Normal. Finger to Nose:- Normal/Intact Strength:- 5/5 equal and symmetric strength both upper and lower extremities.       Assessment & Plan:   Patient Instructions  Recent ha that was partially migraine but also your rt trapezius tenderness presently indicated maybe component of tension ha. No motor or sensory deficits.  We gave toradol 60 mg injection today. Also will prescribe flexeril 5 mg to use at night prn trapezius pain. Rx advisement given.  Overall sound like Ajovy has helped decreased frequency of your ha. Hopefully current HA will resolve. If does stop ha completely let me know. If worsens/severe then advise ED evaluation.  Follow up early January for next Ajovy and keep appointment with Dr. Charlsie Quest Kyshon Tolliver, PA-C

## 2022-03-11 ENCOUNTER — Other Ambulatory Visit (HOSPITAL_BASED_OUTPATIENT_CLINIC_OR_DEPARTMENT_OTHER): Payer: Self-pay

## 2022-04-01 ENCOUNTER — Ambulatory Visit: Payer: No Typology Code available for payment source | Admitting: Medical

## 2022-04-01 ENCOUNTER — Other Ambulatory Visit (HOSPITAL_BASED_OUTPATIENT_CLINIC_OR_DEPARTMENT_OTHER): Payer: Self-pay

## 2022-04-01 ENCOUNTER — Ambulatory Visit: Payer: Self-pay | Admitting: Medical

## 2022-04-01 VITALS — BP 145/80 | HR 60 | Resp 18 | Ht 66.0 in | Wt 243.0 lb

## 2022-04-01 DIAGNOSIS — G43701 Chronic migraine without aura, not intractable, with status migrainosus: Secondary | ICD-10-CM | POA: Diagnosis not present

## 2022-04-01 DIAGNOSIS — R030 Elevated blood-pressure reading, without diagnosis of hypertension: Secondary | ICD-10-CM

## 2022-04-01 MED ORDER — CYCLOBENZAPRINE HCL 5 MG PO TABS
5.0000 mg | ORAL_TABLET | Freq: Every evening | ORAL | 0 refills | Status: DC | PRN
  Filled 2022-04-01: qty 5, 5d supply, fill #0

## 2022-04-01 MED ORDER — KETOROLAC TROMETHAMINE 30 MG/ML IJ SOLN
30.0000 mg | Freq: Once | INTRAMUSCULAR | Status: AC
  Administered 2022-04-01: 30 mg via INTRAMUSCULAR

## 2022-04-01 MED ORDER — TOPIRAMATE 25 MG PO TABS
25.0000 mg | ORAL_TABLET | Freq: Two times a day (BID) | ORAL | 0 refills | Status: DC
Start: 2022-04-01 — End: 2022-05-08
  Filled 2022-04-01: qty 60, 30d supply, fill #0

## 2022-04-01 NOTE — Patient Instructions (Signed)
Migraines with flare since 2 weeks. Last injection ajovy did not help. Will give you toradol low dose 30 mg IM injection today. Refill you topmax daily and make flexeril available if you were to have trapezius pain as in addition to migraines do think you may have tension ha at times.  Keep appointment with neurologist upcoming.   Bp mild elevated. Initially was high but bp was checked first with machine and very thick sweat shirt. On recheck better. We need to get readings of bp when you don't have headache.  If ha does not respond to toradol then have to advise ED evaluation.  Follow up with me in 2 months or sooner if needed.

## 2022-04-01 NOTE — Addendum Note (Signed)
Addended by: Jeronimo Greaves on: 04/01/2022 03:48 PM   Modules accepted: Orders

## 2022-04-01 NOTE — Progress Notes (Signed)
Subjective:    Patient ID: Shirley [name], female    DOB: 16-Oct-1975, 47 y.o.   MRN: [id number]  HPI  Pt in for follow up on migraine ha. Last visit hpi below in "  "Recent ha that was partially migraine but also your rt trapezius tenderness presently indicated maybe component of tension ha. No motor or sensory deficits.   We gave toradol 60 mg injection today. Also will prescribe flexeril 5 mg to use at night prn trapezius pain. Rx advisement given.   Overall sound like Ajovy has helped decreased frequency of your ha. Hopefully current HA will resolve. If does stop ha completely let me know. If worsens/severe then advise ED evaluation.   Follow up early January for next Ajovy and keep appointment with Dr. Jaynee Eagles"  Pt states she has appointment upcoming appointment Apr 14, 2022.  Pt states that the injection that she used only lasted one our. It did not help her headache.  Pt states her migraines are worse. On review of last note pt was given ajovy. Pt states was given samples.   She states November and December injection helped. When she injected on 1/1 did not help at all per pt. She state had ha every day since 03/17/2021. Pt states this past Friday had to miss work due to ha.   Pt states still has severe headache.    In the past I had written topiramate 25 mg daily. Per pt speciaslist did not dc this but she did run out. Also in past I had written flexeril since I had concern for tension ha component.    Review of Systems  Constitutional:  Negative for fatigue and fever.  Respiratory:  Negative for chest tightness, shortness of breath and wheezing.   Cardiovascular:  Negative for chest pain and palpitations.  Gastrointestinal:  Negative for abdominal pain.  Genitourinary:  Negative for dysuria.  Skin:  Negative for rash.  Neurological:  Positive for light-headedness and headaches. Negative for dizziness, syncope and weakness.  Psychiatric/Behavioral:  Negative for  behavioral problems and suicidal ideas. The patient is not nervous/anxious.     Past Medical History:  Diagnosis Date   Anger    "explosive temper disorder"   Anxiety    Asthma    Depression    "extreme depression"   Lumbar back pain    Migraine    Stress    Uterine fibroids affecting pregnancy    reports having a surgical procedure -- unsure what     Social History   Socioeconomic History   Marital status: Single    Spouse name: Not on file   Number of children: 2   Years of education: Not on file   Highest education level: Not on file  Occupational History   Occupation: Teacher, English as a foreign language  Tobacco Use   Smoking status: Former    Years: 25.00    Types: Cigarettes    Quit date: 12/06/2018    Years since quitting: 3.3   Smokeless tobacco: Never  Vaping Use   Vaping Use: Never used  Substance and Sexual Activity   Alcohol use: No   Drug use: No   Sexual activity: Yes    Birth control/protection: Surgical  Other Topics Concern   Not on file  Social History Narrative   Lives with 40 yr old child    Reports being ambidextrous    Caffeine: coffee 8 cups/day, soda 20 oz x 20 per day ("drink like water")   Social Determinants of  Health   Financial Resource Strain: Not on file  Food Insecurity: Not on file  Transportation Needs: Not on file  Physical Activity: Not on file  Stress: Not on file  Social Connections: Not on file  Intimate Partner Violence: Not on file    Past Surgical History:  Procedure Laterality Date   ABDOMINAL HYSTERECTOMY     ABDOMINAL HYSTERECTOMY     pt states she cannot confirm or deny that she had this pressure   MYOMECTOMY     TONSILLECTOMY      Family History  Problem Relation Age of Onset   Pancreatic cancer Father    Bipolar disorder Sister    Liver cancer Sister    Schizophrenia Sister    Epilepsy Sister    Migraines Maternal Aunt    Kidney cancer Maternal Uncle    Alcoholism Maternal Uncle    Schizophrenia Paternal Aunt     Bipolar disorder Paternal Aunt    Schizophrenia Son    Ataxia Neg Hx    Chorea Neg Hx    Dementia Neg Hx    Mental retardation Neg Hx    Multiple sclerosis Neg Hx    Neurofibromatosis Neg Hx    Neuropathy Neg Hx    Parkinsonism Neg Hx    Seizures Neg Hx    Stroke Neg Hx     Allergies  Allergen Reactions   Other Anaphylaxis    IV CT Scan Contrast   Isovue [Iopamidol] Itching    Eye redness/swelling and itching post contrast, hives on upper back   Latex Rash    Current Outpatient Medications on File Prior to Visit  Medication Sig Dispense Refill   butalbital-acetaminophen-caffeine (FIORICET) 50-325-40 MG tablet Take 1 tablet by mouth every 6 (six) hours as needed for headache. 14 tablet 0   cyclobenzaprine (FLEXERIL) 5 MG tablet Take 1 tablet (5 mg total) by mouth at bedtime as needed for trapezius pain with headache. 5 tablet 0   Fremanezumab-vfrm (AJOVY) 225 MG/1.5ML SOAJ Inject 225 mg into the skin every 30 (thirty) days. 4.5 mL 0   methylPREDNISolone (MEDROL DOSEPAK) 4 MG TBPK tablet Take pills daily all together with food. Take the first dose (6 pills) as soon as possible. Take the rest each morning. For 6 days total 6-5-4-3-2-1. 21 tablet 1   topiramate (TOPAMAX) 25 MG tablet Take 1 tablet (25 mg total) by mouth 2 (two) times daily. 60 tablet 0   zolmitriptan (ZOMIG) 5 MG tablet Take 1 tablet (5 mg total) by mouth as needed for migraine. Repeat in 2 hours if needed. Max 2 tablets in 24 hours. 10 tablet 0   No current facility-administered medications on file prior to visit.    BP (!) 145/80 Comment: checked twice.. one reading 140/80  Pulse 60   Resp 18   Ht '5\' 6"'$  (1.676 m)   Wt 243 lb (110.2 kg)   SpO2 100%   BMI 39.22 kg/m        Objective:   Physical Exam  General Mental Status- Alert. General Appearance- Not in acute distress.   Skin General: Color- Normal Color. Moisture- Normal Moisture.  Neck Carotid Arteries- Normal color. Moisture- Normal  Moisture. No carotid bruits. No JVD.  Chest and Lung Exam Auscultation: Breath Sounds:-Normal.  Cardiovascular Auscultation:Rythm- Regular. Murmurs & Other Heart Sounds:Auscultation of the heart reveals- No Murmurs.  Abdomen Inspection:-Inspeection Normal. Palpation/Percussion:Note:No mass. Palpation and Percussion of the abdomen reveal- Non Tender, Non Distended + BS, no rebound or guarding.  Neurologic Cranial Nerve exam:- CN III-XII intact(No nystagmus), symmetric smile. Drift Test:- No drift. Romberg Exam:- Negative.  Heal to Toe Gait exam:-Normal. Finger to Nose:- Normal/Intact Strength:- 5/5 equal and symmetric strength both upper and lower extremities.       Assessment & Plan:   Patient Instructions  Migraines with flare since 2 weeks. Last injection ajovy did not help. Will give you toradol low dose 30 mg IM injection today. Refill you topmax daily and make flexeril available if you were to have trapezius pain as in addition to migraines do think you may have tension ha at times.  Keep appointment with neurologist upcoming.   Bp mild elevated. Initially was high but bp was checked first with machine and very thick sweat shirt. On recheck better. We need to get readings of bp when you don't have headache.  If ha does not respond to toradol then have to advise ED evaluation.  Follow up with me in 2 months or sooner if needed.      Mackie Pai, PA-C

## 2022-04-02 ENCOUNTER — Encounter: Payer: Self-pay | Admitting: Medical

## 2022-04-11 ENCOUNTER — Encounter: Payer: Self-pay | Admitting: Medical

## 2022-04-14 ENCOUNTER — Other Ambulatory Visit (HOSPITAL_BASED_OUTPATIENT_CLINIC_OR_DEPARTMENT_OTHER): Payer: Self-pay

## 2022-04-14 ENCOUNTER — Ambulatory Visit (INDEPENDENT_AMBULATORY_CARE_PROVIDER_SITE_OTHER): Payer: No Typology Code available for payment source | Admitting: Neurology

## 2022-04-14 ENCOUNTER — Encounter: Payer: Self-pay | Admitting: *Deleted

## 2022-04-14 ENCOUNTER — Encounter: Payer: Self-pay | Admitting: Neurology

## 2022-04-14 VITALS — BP 180/89 | HR 66 | Ht 66.5 in | Wt 242.0 lb

## 2022-04-14 DIAGNOSIS — G43009 Migraine without aura, not intractable, without status migrainosus: Secondary | ICD-10-CM | POA: Diagnosis not present

## 2022-04-14 DIAGNOSIS — G43711 Chronic migraine without aura, intractable, with status migrainosus: Secondary | ICD-10-CM | POA: Diagnosis not present

## 2022-04-14 MED ORDER — AJOVY 225 MG/1.5ML ~~LOC~~ SOAJ
225.0000 mg | SUBCUTANEOUS | 11 refills | Status: DC
  Filled 2022-04-14: qty 1.5, 28d supply, fill #0
  Filled 2022-05-08 – 2022-07-16 (×3): qty 1.5, 28d supply, fill #1
  Filled 2022-08-09: qty 1.5, 28d supply, fill #2

## 2022-04-14 MED ORDER — ONDANSETRON 4 MG PO TBDP
4.0000 mg | ORAL_TABLET | Freq: Three times a day (TID) | ORAL | 3 refills | Status: AC | PRN
  Filled 2022-04-14: qty 18, 21d supply, fill #0

## 2022-04-14 MED ORDER — UBRELVY 100 MG PO TABS: 100.0000 mg | ORAL_TABLET | ORAL | 11 refills | Status: DC | PRN

## 2022-04-14 MED ORDER — RIZATRIPTAN BENZOATE 10 MG PO TBDP
10.0000 mg | ORAL_TABLET | ORAL | 11 refills | Status: AC | PRN
  Filled 2022-04-14: qty 9, 9d supply, fill #0

## 2022-04-14 NOTE — Patient Instructions (Addendum)
Preventative: Ajovy (may need to change to emgality based on insurance). Fill out copay cards.  Emergency: AS SOON AS POSSIBLE try 2 strategies; Rizatriptan and ondansetron at onset: Please take at the onset of your headache. If it does not improve the symptoms repeat Ubrelvy: May take with ondansetron and Rizatriptan or alone: Please take one tablet at the onset of your headache. If it does not improve the symptoms please take one additional tablet.   Ondansetron: Nausea, can take alone for nause or take with Rizatriptan and Veronia Beets: Will come from [facility], they will call. Does NOT cause rebound so can take up to 16 pills a month.   Computer at work: [employer] and can try FL-41 glasses for light sensitivity  Letter for accomodations:  patient is under my care for chronic migraines. Migraines can be triggered by both physical as well as environmental factors. I recommend an ergonomic work desk with a sit-stand feature to allow for her neck and back to not be strained as this can trigger a migraine. If possible, please provide a screen dimmer/anti-flare filter/blue light filter for her computer monitor to decrease the brightness of the screen. Providing natural light and limiting fluorescent lighting or dimming that type of lighting will also be beneficial as fluorescent lighting can trigger migraines as well. If possible, please allow patient to wear sunglasses or anti-glare glasses(fl-41, theraspecs etc) in the work area. Also a telephone headset may be beneficial if patient is on the phone a considerable amount of time. Encouragement of a fragrance-free environment may help as well. Any possibility of working from home should be considered.   Meds ordered this encounter  Medications   Fremanezumab-vfrm (AJOVY) 225 MG/1.5ML SOAJ    Sig: Inject 225 mg into the skin every 30 (thirty) days.    Dispense:  1.5 mL    Refill:  11   rizatriptan (MAXALT-MLT) 10 MG disintegrating  tablet    Sig: Take 1 tablet (10 mg total) by mouth as needed for migraine. May repeat in 2 hours if needed    Dispense:  9 tablet    Refill:  11   ondansetron (ZOFRAN-ODT) 4 MG disintegrating tablet    Sig: Take 1-2 tablets (4-8 mg total) by mouth every 8 (eight) hours as needed.    Dispense:  30 tablet    Refill:  3   Ubrogepant (UBRELVY) 100 MG TABS    Sig: Take 1 tablet (100 mg total) by mouth every 2 (two) hours as needed. Maximum '200mg'$  a day.    Dispense:  16 tablet    Refill:  11    At this time having between 4-7 migraine days a month and < 10 total headache days a month. tried zomig, imitrex, rizatriptan     Ubrogepant Tablets What is this medication? UBROGEPANT (ue BROE je pant) treats migraines. It works by blocking a substance in the body that causes migraines. It is not used to prevent migraines. This medicine may be used for other purposes; ask your health care provider or pharmacist if you have questions. COMMON BRAND NAME(S): Roselyn Meier What should I tell my care team before I take this medication? They need to know if you have any of these conditions: Kidney disease Liver disease An unusual or allergic reaction to ubrogepant, other medications, foods, dyes, or preservatives Pregnant or trying to get pregnant Breast-feeding How should I use this medication? Take this medication by mouth with a glass of water. Take it as directed  on the prescription label. You can take it with or without food. If it upsets your stomach, take it with food. Keep taking it unless your care team tells you to stop. Talk to your care team about the use of this medication in children. Special care may be needed. Overdosage: If you think you have taken too much of this medicine contact a poison control center or emergency room at once. NOTE: This medicine is only for you. Do not share this medicine with others. What if I miss a dose? This does not apply. This medication is not for regular  use. What may interact with this medication? Do not take this medication with any of the following: Adagrasib Ceritinib Certain antibiotics, such as chloramphenicol, clarithromycin, telithromycin Certain antivirals for HIV, such as atazanavir, cobicistat, darunavir, delavirdine, fosamprenavir, indinavir, ritonavir Certain medications for fungal infections, such as itraconazole, ketoconazole, posaconazole, voriconazole Conivaptan Grapefruit Idelalisib Mifepristone Nefazodone Ribociclib This medication may also interact with the following: Carvedilol Certain medications for seizures, such as phenobarbital, phenytoin Ciprofloxacin Cyclosporine Eltrombopag Fluconazole Fluvoxamine Quinidine Rifampin St. John's wort Verapamil This list may not describe all possible interactions. Give your health care provider a list of all the medicines, herbs, non-prescription drugs, or dietary supplements you use. Also tell them if you smoke, drink alcohol, or use illegal drugs. Some items may interact with your medicine. What should I watch for while using this medication? Visit your care team for regular checks on your progress. Tell your care team if your symptoms do not start to get better or if they get worse. Your mouth may get dry. Chewing sugarless gum or sucking hard candy and drinking plenty of water may help. Contact your care team if the problem does not go away or is severe. What side effects may I notice from receiving this medication? Side effects that you should report to your care team as soon as possible: Allergic reactions--skin rash, itching, hives, swelling of the face, lips, tongue, or throat Side effects that usually do not require medical attention (report to your care team if they continue or are bothersome): Drowsiness Dry mouth Fatigue Nausea This list may not describe all possible side effects. Call your doctor for medical advice about side effects. You may report side  effects to FDA at 1-800-FDA-1088. Where should I keep my medication? Keep out of the reach of children and pets. Store between 15 and 30 degrees C (59 and 86 degrees F). Get rid of any unused medication after the expiration date. To get rid of medications that are no longer needed or have expired: Take the medication to a medication take-back program. Check with your pharmacy or law enforcement to find a location. If you cannot return the medication, check the label or package insert to see if the medication should be thrown out in the garbage or flushed down the toilet. If you are not sure, ask your care team. If it is safe to put it in the trash, pour the medication out of the container. Mix the medication with cat litter, dirt, coffee grounds, or other unwanted substance. Seal the mixture in a bag or container. Put it in the trash. NOTE: This sheet is a summary. It may not cover all possible information. If you have questions about this medicine, talk to your doctor, pharmacist, or health care provider.  2023 Elsevier/Gold Standard (2021-04-24 00:00:00) Ondansetron Dissolving Tablets What is this medication? ONDANSETRON (on DAN se tron) prevents nausea and vomiting from chemotherapy, radiation, or surgery. It  works by blocking substances in the body that may cause nausea or vomiting. It belongs to a group of medications called antiemetics. This medicine may be used for other purposes; ask your health care provider or pharmacist if you have questions. COMMON BRAND NAME(S): Zofran ODT What should I tell my care team before I take this medication? They need to know if you have any of these conditions: Heart disease History of irregular heartbeat Liver disease Low levels of magnesium or potassium in the blood An unusual or allergic reaction to ondansetron, granisetron, other medications, foods, dyes, or preservatives Pregnant or trying to get pregnant Breast-feeding How should I use this  medication? These tablets are made to dissolve in the mouth. Do not try to push the tablet through the foil backing. With dry hands, peel away the foil backing and gently remove the tablet. Place the tablet in the mouth and allow it to dissolve, then swallow. While you may take these tablets with water, it is not necessary to do so. Talk to your care team regarding the use of this medication in children. Special care may be needed. Overdosage: If you think you have taken too much of this medicine contact a poison control center or emergency room at once. NOTE: This medicine is only for you. Do not share this medicine with others. What if I miss a dose? If you miss a dose, take it as soon as you can. If it is almost time for your next dose, take only that dose. Do not take double or extra doses. What may interact with this medication? Do not take this medication with any of the following: Apomorphine Certain medications for fungal infections like fluconazole, itraconazole, ketoconazole, posaconazole, voriconazole Cisapride Dronedarone Pimozide Thioridazine This medication may also interact with the following: Carbamazepine Certain medications for depression, anxiety, or psychotic disturbances Fentanyl Linezolid MAOIs like Carbex, Eldepryl, Marplan, Nardil, and Parnate Methylene blue (injected into a vein) Other medications that prolong the QT interval (cause an abnormal heart rhythm) like dofetilide, ziprasidone Phenytoin Rifampicin Tramadol This list may not describe all possible interactions. Give your health care provider a list of all the medicines, herbs, non-prescription drugs, or dietary supplements you use. Also tell them if you smoke, drink alcohol, or use illegal drugs. Some items may interact with your medicine. What should I watch for while using this medication? Check with your care team as soon as you can if you have any sign of an allergic reaction. What side effects may I  notice from receiving this medication? Side effects that you should report to your care team as soon as possible: Allergic reactions--skin rash, itching, hives, swelling of the face, lips, tongue, or throat Bowel blockage--stomach cramping, unable to have a bowel movement or pass gas, loss of appetite, vomiting Chest pain (angina)--pain, pressure, or tightness in the chest, neck, back, or arms Heart rhythm changes--fast or irregular heartbeat, dizziness, feeling faint or lightheaded, chest pain, trouble breathing Irritability, confusion, fast or irregular heartbeat, muscle stiffness, twitching muscles, sweating, high fever, seizure, chills, vomiting, diarrhea, which may be signs of serotonin syndrome Side effects that usually do not require medical attention (report to your care team if they continue or are bothersome): Constipation Diarrhea General discomfort and fatigue Headache This list may not describe all possible side effects. Call your doctor for medical advice about side effects. You may report side effects to FDA at 1-800-FDA-1088. Where should I keep my medication? Keep out of the reach of children and pets. Store  between 2 and 30 degrees C (36 and 86 degrees F). Throw away any unused medication after the expiration date. NOTE: This sheet is a summary. It may not cover all possible information. If you have questions about this medicine, talk to your doctor, pharmacist, or health care provider.  2023 Elsevier/Gold Standard (2007-04-24 00:00:00) Rizatriptan Disintegrating Tablets What is this medication? RIZATRIPTAN (rye za TRIP tan) treats migraines. It works by blocking pain signals and narrowing blood vessels in the brain. It belongs to a group of medications called triptans. It is not used to prevent migraines. This medicine may be used for other purposes; ask your health care provider or pharmacist if you have questions. COMMON BRAND NAME(S): Maxalt-MLT What should I tell my  care team before I take this medication? They need to know if you have any of these conditions: Circulation problems in fingers and toes Diabetes Heart disease High blood pressure High cholesterol History of irregular heartbeat History of stroke Stomach or intestine problems Tobacco use An unusual or allergic reaction to rizatriptan, other medications, foods, dyes, or preservatives Pregnant or trying to get pregnant Breast-feeding How should I use this medication? Take this medication by mouth. Take it as directed on the prescription label. You do not need water to take this medication. Leave the tablet in the sealed pack until you are ready to take it. With dry hands, open the pack and gently remove the tablet. If the tablet breaks or crumbles, throw it away. Use a new tablet. Place the tablet on the tongue and allow it to dissolve. Then, swallow it. Do not cut, crush, or chew this medication. Do not use it more often than directed. Talk to your care team about the use of this medication in children. While it may be prescribed for children as young as 6 years for selected conditions, precautions do apply. Overdosage: If you think you have taken too much of this medicine contact a poison control center or emergency room at once. NOTE: This medicine is only for you. Do not share this medicine with others. What if I miss a dose? This does not apply. This medication is not for regular use. What may interact with this medication? Do not take this medication with any of the following: Ergot alkaloids, such as dihydroergotamine, ergotamine MAOIs, such as Marplan, Nardil, Parnate Other medications for migraine headache, such as almotriptan, eletriptan, frovatriptan, naratriptan, sumatriptan, zolmitriptan This medication may also interact with the following: Certain medications for depression, anxiety, or other mental health conditions Propranolol This list may not describe all possible  interactions. Give your health care provider a list of all the medicines, herbs, non-prescription drugs, or dietary supplements you use. Also tell them if you smoke, drink alcohol, or use illegal drugs. Some items may interact with your medicine. What should I watch for while using this medication? Visit your care team for regular checks on your progress. Tell your care team if your symptoms do not start to get better or if they get worse. This medication may affect your coordination, reaction time, or judgment. Do not drive or operate machinery until you know how this medication affects you. Sit up or stand slowly to reduce the risk of dizzy or fainting spells. If you take migraine medications for 10 or more days a month, your migraines may get worse. Keep a diary of headache days and medication use. Contact your care team if your migraine attacks occur more frequently. What side effects may I notice from receiving this  medication? Side effects that you should report to your care team as soon as possible: Allergic reactions--skin rash, itching, hives, swelling of the face, lips, tongue, or throat Burning, pain, tingling, or color changes in the hands, arms, legs, or feet Heart attack--pain or tightness in the chest, shoulders, arms, or jaw, nausea, shortness of breath, cold or clammy skin, feeling faint or lightheaded Heart rhythm changes--fast or irregular heartbeat, dizziness, feeling faint or lightheaded, chest pain, trouble breathing Increase in blood pressure Irritability, confusion, fast or irregular heartbeat, muscle stiffness, twitching muscles, sweating, high fever, seizure, chills, vomiting, diarrhea, which may be signs of serotonin syndrome Raynaud syndrome--cool, numb, or painful fingers or toes that may change color from pale, to blue, to red Seizures Stroke--sudden numbness or weakness of the face, arm, or leg, trouble speaking, confusion, trouble walking, loss of balance or  coordination, dizziness, severe headache, change in vision Sudden or severe stomach pain, bloody diarrhea, fever, nausea, vomiting Vision loss Side effects that usually do not require medical attention (report to your care team if they continue or are bothersome): Dizziness Unusual weakness or fatigue This list may not describe all possible side effects. Call your doctor for medical advice about side effects. You may report side effects to FDA at 1-800-FDA-1088. Where should I keep my medication? Keep out of the reach of children and pets. Store at room temperature between 15 and 30 degrees C (59 and 86 degrees F). Protect from light and moisture. Get rid of any unused medication after the expiration date. To get rid of medications that are no longer needed or have expired: Take the medication to a medication take-back program. Check with your pharmacy or law enforcement to find a location. If you cannot return the medication, check the label or package insert to see if the medication should be thrown out in the garbage or flushed down the toilet. If you are not sure, ask your care team. If it is safe to put it in the trash, empty the medication out of the container. Mix the medication with cat litter, dirt, coffee grounds, or other unwanted substance. Seal the mixture in a bag or container. Put it in the trash. NOTE: This sheet is a summary. It may not cover all possible information. If you have questions about this medicine, talk to your doctor, pharmacist, or health care provider.  2023 Elsevier/Gold Standard (2021-07-04 00:00:00)

## 2022-04-14 NOTE — Progress Notes (Signed)
[facility]    Provider:  Dr Jaynee Eagles Requesting Provider: Elise Benne Primary Care Provider:  Mackie Pai, PA-C  CC:  migraines  Follow-up April 14, 2022: Patient is here for follow-up for migraines we initially saw her in November 2023 for the same.  At the time we brought her to infusion and started her on a CGRP injectable.  She is here for follow-up.  Patient doing great on Ajovy, will prescribe, has significantly improved from daily migraines to < 8 and < 10 days a month headaches today will discuss trying a different acute management medication such as ubrelvy or nurtec. Triptans are not working. She had a great response to Ajovy, but till having migraines and needs acute management we discussed. Also getting the migraines at work, may need special accomodations  Patient complains of symptoms per HPI as well as the following symptoms: migraines . Pertinent negatives and positives per HPI. All others negative    HPI 01/15/2022:  Shirley [name] is a 47 y.o. female here as requested by Mackie Pai, PA-C for migraines. Migraines started at the age of 54, her aunt had migraines. She has daily migraines. She is using excedrin and goes through a big bottle every 2 weeks. Migraines lasting 24 hours every day. They are pulsating/pounding/throbbing, photophobia/phonophobia,nausea, hurts to move, nausea, dizziness. Stress can make them worse, not sleeping. Excedrin helps temporarily. Not positional or exertional no vision changes. She is on medical leave, being on computer screen can make it worse. No other focal neurologic deficits, associated symptoms, inciting events or modifiable factors.   Reviewed notes, labs and imaging from outside physicians, which showed:  From a thorough review of records, medications tried that can be used in migraine and headache management include: Tylenol, amlodipine, aspirin, Fioricet, Flexeril, Decadron, Benadryl, gabapentin,  ibuprofen, ketorolac, Reglan, Robaxin, Naprosyn, nortriptyline, sumatriptan, Topamax, zolmitriptan, maxalt, ondansetron, ubrelvy, aimovig contraindicated due to constipation, Ajovy extremely effective. Cannot tolerate propranolol due to bradycardia and usually has low blood pressure today in pain and is elevated.   12/31/2021: EXAM: CT HEAD WITHOUT CONTRAST   CT CERVICAL SPINE WITHOUT CONTRAST   TECHNIQUE: Multidetector CT imaging of the head and cervical spine was performed following the standard protocol without intravenous contrast. Multiplanar CT image reconstructions of the cervical spine were also generated.   RADIATION DOSE REDUCTION: This exam was performed according to the departmental dose-optimization program which includes automated exposure control, adjustment of the mA and/or kV according to patient size and/or use of iterative reconstruction technique.   COMPARISON:  Head CT 12/08/2018.   FINDINGS: CT HEAD FINDINGS   Brain: There is no evidence of an acute infarct, intracranial hemorrhage, mass, midline shift, or extra-axial fluid collection. The ventricles and sulci are normal.   Vascular: No hyperdense vessel.   Skull: No fracture or suspicious osseous lesion.   Sinuses/Orbits: Paranasal sinuses and mastoid air cells are clear. Unremarkable orbits.   Other: None.   CT CERVICAL SPINE FINDINGS   Alignment: Cervical spine straightening. Trace retrolisthesis of C3 on C4, likely degenerative.   Skull base and vertebrae: No acute fracture or suspicious osseous lesion.   Soft tissues and spinal canal: No prevertebral fluid or swelling. No visible canal hematoma.   Disc levels: Mild cervical spondylosis with degenerative endplate spurring being most notable at C5-6 and C6-7. No evidence of high-grade stenosis.   Upper chest: Clear lung apices.   Other: None.   IMPRESSION: No evidence of acute intracranial abnormality or cervical  spine fracture.  Review of Systems: Patient complains of symptoms per HPI as well as the following symptoms migraines. Pertinent negatives and positives per HPI. All others negative.   Social History   Socioeconomic History   Marital status: Single    Spouse name: Not on file   Number of children: 2   Years of education: Not on file   Highest education level: Not on file  Occupational History   Occupation: Teacher, English as a foreign language  Tobacco Use   Smoking status: Former    Years: 25.00    Types: Cigarettes    Quit date: 12/06/2018    Years since quitting: 3.3   Smokeless tobacco: Never  Vaping Use   Vaping Use: Never used  Substance and Sexual Activity   Alcohol use: No   Drug use: No   Sexual activity: Yes    Birth control/protection: Surgical  Other Topics Concern   Not on file  Social History Narrative   Lives with 52 yr old child    Reports being ambidextrous    Caffeine: coffee 8 cups/day, soda 20 oz x 20 per day ("drink like water")      04/14/22 no soda for last 3 weeks, drinking water instead    Social Determinants of Health   Financial Resource Strain: Not on file  Food Insecurity: Not on file  Transportation Needs: Not on file  Physical Activity: Not on file  Stress: Not on file  Social Connections: Not on file  Intimate Partner Violence: Not on file    Family History  Problem Relation Age of Onset   Pancreatic cancer Father    Bipolar disorder Sister    Liver cancer Sister    Schizophrenia Sister    Epilepsy Sister    Migraines Maternal Aunt    Kidney cancer Maternal Uncle    Alcoholism Maternal Uncle    Schizophrenia Paternal Aunt    Bipolar disorder Paternal Aunt    Schizophrenia Son    Ataxia Neg Hx    Chorea Neg Hx    Dementia Neg Hx    Mental retardation Neg Hx    Multiple sclerosis Neg Hx    Neurofibromatosis Neg Hx    Neuropathy Neg Hx    Parkinsonism Neg Hx    Seizures Neg Hx    Stroke Neg Hx     Past Medical History:  Diagnosis  Date   Anger    "explosive temper disorder"   Anxiety    Asthma    Depression    "extreme depression"   Lumbar back pain    Migraine    Stress    Uterine fibroids affecting pregnancy    reports having a surgical procedure -- unsure what    Patient Active Problem List   Diagnosis Date Noted   Chronic migraine without aura, with intractable migraine, so stated, with status migrainosus 01/15/2022   Syncope 12/10/2018   Obesity, Class III, BMI 40-49.9 (morbid obesity) ([place]) 12/10/2018   Upper and lower extremity pain 03/16/2016   Lumbar radiculopathy 06/11/2015   Medication overuse headache 06/02/2013   Essential hypertension 06/02/2013    Past Surgical History:  Procedure Laterality Date   ABDOMINAL HYSTERECTOMY     ABDOMINAL HYSTERECTOMY     pt states she cannot confirm or deny that she had this pressure   MYOMECTOMY     TONSILLECTOMY      Current Outpatient Medications  Medication Sig Dispense Refill   cyclobenzaprine (FLEXERIL) 5 MG tablet Take 1 tablet (5 mg total) by mouth at  bedtime as needed for trapezius pain with headache. 5 tablet 0   Fremanezumab-vfrm (AJOVY) 225 MG/1.5ML SOAJ Inject 225 mg into the skin every 30 (thirty) days. 4.5 mL 0   Fremanezumab-vfrm (AJOVY) 225 MG/1.5ML SOAJ Inject 225 mg into the skin every 30 (thirty) days. 1.5 mL 11   ondansetron (ZOFRAN-ODT) 4 MG disintegrating tablet Take 1-2 tablets (4-8 mg total) by mouth every 8 (eight) hours as needed. 30 tablet 3   rizatriptan (MAXALT-MLT) 10 MG disintegrating tablet Take 1 tablet (10 mg total) by mouth as needed for migraine. May repeat in 2 hours if needed 9 tablet 11   topiramate (TOPAMAX) 25 MG tablet Take 1 tablet (25 mg total) by mouth 2 (two) times daily. 60 tablet 0   Ubrogepant (UBRELVY) 100 MG TABS Take 1 tablet (100 mg total) by mouth every 2 (two) hours as needed. Maximum '200mg'$  a day. 16 tablet 11   zolmitriptan (ZOMIG) 5 MG tablet Take 1 tablet (5 mg total) by mouth as needed for  migraine. Repeat in 2 hours if needed. Max 2 tablets in 24 hours. 10 tablet 0   No current facility-administered medications for this visit.    Allergies as of 04/14/2022 - Review Complete 04/14/2022  Allergen Reaction Noted   Other Anaphylaxis 11/15/2021   Isovue [iopamidol] Itching 01/25/2016   Latex Rash 12/09/2012    Vitals: BP (!) 180/89 (BP Location: Right Arm, Patient Position: Sitting, Cuff Size: Large)   Pulse 66   Ht 5' 6.5" (1.689 m)   Wt 242 lb (109.8 kg)   BMI 38.47 kg/m  Last Weight:  Wt Readings from Last 1 Encounters:  04/14/22 242 lb (109.8 kg)   Last Height:   Ht Readings from Last 1 Encounters:  04/14/22 5' 6.5" (1.689 m)   Physical exam: Exam: Gen: NAD, conversant, well nourised, obese, well groomed                     CV: RRR, no MRG. No Carotid Bruits. No peripheral edema, warm, nontender Eyes: Conjunctivae clear without exudates or hemorrhage  Neuro: Detailed Neurologic Exam  Speech:    Speech is normal; fluent and spontaneous with normal comprehension.  Cognition:    The patient is oriented to person, place, and time;     recent and remote memory intact;     language fluent;     normal attention, concentration,     fund of knowledge Cranial Nerves:    The pupils are equal, round, and reactive to light. The fundi are normal and spontaneous venous pulsations are present. Visual fields are full to finger confrontation. Extraocular movements are intact. Trigeminal sensation is intact and the muscles of mastication are normal. The face is symmetric. The palate elevates in the midline. Hearing intact. Voice is normal. Shoulder shrug is normal. The tongue has normal motion without fasciculations.   Coordination:    Normal finger to nose and heel to shin. Normal rapid alternating movements.   Gait:    Heel-toe and tandem gait are normal.   Motor Observation:    No asymmetry, no atrophy, and no involuntary movements noted. Tone:    Normal muscle  tone.    Posture:    Posture is normal. normal erect    Strength:    Strength is V/V in the upper and lower limbs.      Sensation: intact to LT     Reflex Exam:  DTR's:    Deep tendon reflexes in the upper and  lower extremities are normal bilaterally.   Toes:    The toes are downgoing bilaterally.   Clonus:    Clonus is absent.    Assessment/Plan:  Patient with chronic migraines and medication overuse  - Patient is here for follow-up for migraines we initially saw her in November 2023 for the same.  At the time we brought her to infusion and started her on a CGRP injectable.  She is here for follow-up.   - Patient doing great on Ajovy, will prescribe, has significantly improved from daily migraines to < 8 and < 10 days a month headaches today will discuss trying a different acute management medication such as ubrelvy or nurtec. Triptans are not working. She had a great response to Ajovy, but till having migraines and needs acute management we discussed. Also getting the migraines at work, may need special accomodations  Preventative: Ajovy (may need to change to emgality based on insurance). Fill out copay cards.  Emergency: AS SOON AS POSSIBLE try 2 strategies; Rizatriptan and ondansetron at onset: Please take one tablet at the onset of your headache. If it does not improve the symptoms please take one additional tablet.  Roselyn Meier: May take with ondansetron and Rizatriptan or alone: Please take one tablet at the onset of your headache. If it does not improve the symptoms please take one additional tablet.   Ondansetron: Nausea, can take alone for nause or take with Rizatriptan and Veronia Beets: Will come from [facility], they will call   Computer at work: [employer] and can try FL-41 glasses for light sensitivity  Letter for accomodations:  patient is under my care for chronic migraines. Migraines can be triggered by both physical as well as environmental factors. I  recommend an ergonomic work desk with a sit-stand feature to allow for her neck and back to not be strained as this can trigger a migraine. If possible, please provide a screen dimmer/anti-flare filter/blue light filter for her computer monitor to decrease the brightness of the screen. Providing natural light and limiting fluorescent lighting or dimming that type of lighting will also be beneficial as fluorescent lighting can trigger migraines as well. If possible, please allow patient to wear sunglasses or anti-glare glasses(fl-41, theraspecs etc) in the work area. Also a telephone headset may be beneficial if patient is on the phone a considerable amount of time. Encouragement of a fragrance-free environment may help as well. Any possibility of working from home should be considered.   Meds ordered this encounter  Medications   Fremanezumab-vfrm (AJOVY) 225 MG/1.5ML SOAJ    Sig: Inject 225 mg into the skin every 30 (thirty) days.    Dispense:  1.5 mL    Refill:  11   rizatriptan (MAXALT-MLT) 10 MG disintegrating tablet    Sig: Take 1 tablet (10 mg total) by mouth as needed for migraine. May repeat in 2 hours if needed    Dispense:  9 tablet    Refill:  11   ondansetron (ZOFRAN-ODT) 4 MG disintegrating tablet    Sig: Take 1-2 tablets (4-8 mg total) by mouth every 8 (eight) hours as needed.    Dispense:  30 tablet    Refill:  3   Ubrogepant (UBRELVY) 100 MG TABS    Sig: Take 1 tablet (100 mg total) by mouth every 2 (two) hours as needed. Maximum '200mg'$  a day.    Dispense:  16 tablet    Refill:  11    At this time having  between 4-7 migraine days a month and < 10 total headache days a month. tried zomig, imitrex, rizatriptan   To prevent or relieve headaches, try the following: Cool Compress. Lie down and place a cool compress on your head.  Avoid headache triggers. If certain foods or odors seem to have triggered your migraines in the past, avoid them. A headache diary might help you identify  triggers.  Include physical activity in your daily routine. Try a daily walk or other moderate aerobic exercise.  Manage stress. Find healthy ways to cope with the stressors, such as delegating tasks on your to-do list.  Practice relaxation techniques. Try deep breathing, yoga, massage and visualization.  Eat regularly. Eating regularly scheduled meals and maintaining a healthy diet might help prevent headaches. Also, drink plenty of fluids.  Follow a regular sleep schedule. Sleep deprivation might contribute to headaches Consider biofeedback. With this mind-body technique, you learn to control certain bodily functions -- such as muscle tension, heart rate and blood pressure -- to prevent headaches or reduce headache pain.    Proceed to emergency room if you experience new or worsening symptoms or symptoms do not resolve, if you have new neurologic symptoms or if headache is severe, or for any concerning symptom.   Provided education and documentation from American headache Society toolbox including articles on: chronic migraine medication overuse headache, chronic migraines, prevention of migraines, behavioral and other nonpharmacologic treatments for headache.  Cc: Saguier, Iris Pert,  Saguier, Percell Miller, PA-C  Sarina Ill, MD  [facility] [address] [place], [place] [address]  Phone [phone] Fax [phone]  I spent over 45 minutes of face-to-face and non-face-to-face time with patient on the  1. Chronic migraine without aura, with intractable migraine, so stated, with status migrainosus   2. Migraine without aura and without status migrainosus, not intractable     diagnosis.  This included previsit chart review, lab review, study review, order entry, electronic health record documentation, patient education on the different diagnostic and therapeutic options, counseling and coordination of care, risks and benefits of management, compliance, or  risk factor reduction

## 2022-04-15 ENCOUNTER — Ambulatory Visit (INDEPENDENT_AMBULATORY_CARE_PROVIDER_SITE_OTHER): Payer: No Typology Code available for payment source | Admitting: Medical

## 2022-04-15 ENCOUNTER — Other Ambulatory Visit (HOSPITAL_BASED_OUTPATIENT_CLINIC_OR_DEPARTMENT_OTHER): Payer: Self-pay

## 2022-04-15 VITALS — BP 130/80 | HR 78 | Resp 18 | Ht 66.5 in | Wt 242.0 lb

## 2022-04-15 DIAGNOSIS — Z1211 Encounter for screening for malignant neoplasm of colon: Secondary | ICD-10-CM | POA: Diagnosis not present

## 2022-04-15 DIAGNOSIS — G43711 Chronic migraine without aura, intractable, with status migrainosus: Secondary | ICD-10-CM

## 2022-04-15 NOTE — Progress Notes (Signed)
Subjective:    Patient ID: Shirley [name], female    DOB: 03/13/1976, 47 y.o.   MRN: [id number]  HPI Pt in for follow up.  She needs a note for days she missed work. She missed work from  January 12 thru January 20 th. Pt want to return to work on Apr 03, 2022 but that morning had head ache and could not go to work.    Pt just saw neurologist and plan in place with work modification and medication plan.   Yesterday visit with neurologist A/P  "Assessment/Plan:  Patient with chronic migraines and medication overuse   - Patient is here for follow-up for migraines we initially saw her in November 2023 for the same.  At the time we brought her to infusion and started her on a CGRP injectable.  She is here for follow-up.   - Patient doing great on Ajovy, will prescribe, has significantly improved from daily migraines to < 8 and < 10 days a month headaches today will discuss trying a different acute management medication such as ubrelvy or nurtec. Triptans are not working. She had a great response to Ajovy, but till having migraines and needs acute management we discussed. Also getting the migraines at work, may need special accomodations   Preventative: Ajovy (may need to change to emgality based on insurance). Fill out copay cards.  Emergency: AS SOON AS POSSIBLE try 2 strategies; Rizatriptan and ondansetron at onset: Please take one tablet at the onset of your headache. If it does not improve the symptoms please take one additional tablet.  Roselyn Meier: May take with ondansetron and Rizatriptan or alone: Please take one tablet at the onset of your headache. If it does not improve the symptoms please take one additional tablet.    Ondansetron: Nausea, can take alone for nause or take with Rizatriptan and Veronia Beets: Will come from [facility], they will call    Computer at work: [employer] and can try FL-41 glasses for light sensitivity   Letter for accomodations:  patient  is under my care for chronic migraines. Migraines can be triggered by both physical as well as environmental factors. I recommend an ergonomic work desk with a sit-stand feature to allow for her neck and back to not be strained as this can trigger a migraine. If possible, please provide a screen dimmer/anti-flare filter/blue light filter for her computer monitor to decrease the brightness of the screen. Providing natural light and limiting fluorescent lighting or dimming that type of lighting will also be beneficial as fluorescent lighting can trigger migraines as well. If possible, please allow patient to wear sunglasses or anti-glare glasses(fl-41, theraspecs etc) in the work area. Also a telephone headset may be beneficial if patient is on the phone a considerable amount of time. Encouragement of a fragrance-free environment may help as well. Any possibility of working from home should be considered."   Pt has fmla in place. Paperwork already.   Review of Systems  Constitutional:  Negative for chills, diaphoresis and fatigue.  Respiratory:  Negative for cough, choking, wheezing and stridor.   Cardiovascular:  Negative for chest pain and palpitations.  Gastrointestinal:  Negative for abdominal pain.  Musculoskeletal:  Negative for back pain, joint swelling and myalgias.  Skin:  Negative for rash.  Neurological:  Negative for dizziness, seizures, speech difficulty, weakness and headaches.       No headache presently.  Hematological:  Negative for adenopathy. Does not bruise/bleed easily.  Psychiatric/Behavioral:  Negative for behavioral problems, decreased concentration and dysphoric mood.     Past Medical History:  Diagnosis Date   Anger    "explosive temper disorder"   Anxiety    Asthma    Depression    "extreme depression"   Lumbar back pain    Migraine    Stress    Uterine fibroids affecting pregnancy    reports having a surgical procedure -- unsure what     Social History    Socioeconomic History   Marital status: Single    Spouse name: Not on file   Number of children: 2   Years of education: Not on file   Highest education level: Not on file  Occupational History   Occupation: Teacher, English as a foreign language  Tobacco Use   Smoking status: Former    Years: 25.00    Types: Cigarettes    Quit date: 12/06/2018    Years since quitting: 3.3   Smokeless tobacco: Never  Vaping Use   Vaping Use: Never used  Substance and Sexual Activity   Alcohol use: No   Drug use: No   Sexual activity: Yes    Birth control/protection: Surgical  Other Topics Concern   Not on file  Social History Narrative   Lives with 73 yr old child    Reports being ambidextrous    Caffeine: coffee 8 cups/day, soda 20 oz x 20 per day ("drink like water")      04/14/22 no soda for last 3 weeks, drinking water instead    Social Determinants of Health   Financial Resource Strain: Not on file  Food Insecurity: Not on file  Transportation Needs: Not on file  Physical Activity: Not on file  Stress: Not on file  Social Connections: Not on file  Intimate Partner Violence: Not on file    Past Surgical History:  Procedure Laterality Date   ABDOMINAL HYSTERECTOMY     ABDOMINAL HYSTERECTOMY     pt states she cannot confirm or deny that she had this pressure   MYOMECTOMY     TONSILLECTOMY      Family History  Problem Relation Age of Onset   Pancreatic cancer Father    Bipolar disorder Sister    Liver cancer Sister    Schizophrenia Sister    Epilepsy Sister    Migraines Maternal Aunt    Kidney cancer Maternal Uncle    Alcoholism Maternal Uncle    Schizophrenia Paternal Aunt    Bipolar disorder Paternal Aunt    Schizophrenia Son    Ataxia Neg Hx    Chorea Neg Hx    Dementia Neg Hx    Mental retardation Neg Hx    Multiple sclerosis Neg Hx    Neurofibromatosis Neg Hx    Neuropathy Neg Hx    Parkinsonism Neg Hx    Seizures Neg Hx    Stroke Neg Hx     Allergies  Allergen  Reactions   Other Anaphylaxis    IV CT Scan Contrast   Isovue [Iopamidol] Itching    Eye redness/swelling and itching post contrast, hives on upper back   Latex Rash    Current Outpatient Medications on File Prior to Visit  Medication Sig Dispense Refill   cyclobenzaprine (FLEXERIL) 5 MG tablet Take 1 tablet (5 mg total) by mouth at bedtime as needed for trapezius pain with headache. 5 tablet 0   Fremanezumab-vfrm (AJOVY) 225 MG/1.5ML SOAJ Inject 225 mg into the skin every 30 (thirty) days. 4.5 mL 0  Fremanezumab-vfrm (AJOVY) 225 MG/1.5ML SOAJ Inject 225 mg into the skin every 30 (thirty) days. 1.5 mL 11   ondansetron (ZOFRAN-ODT) 4 MG disintegrating tablet Take 1-2 tablets (4-8 mg total) by mouth every 8 (eight) hours as needed. 30 tablet 3   rizatriptan (MAXALT-MLT) 10 MG disintegrating tablet Take 1 tablet (10 mg total) by mouth as needed for migraine. May repeat in 2 hours if needed 9 tablet 11   topiramate (TOPAMAX) 25 MG tablet Take 1 tablet (25 mg total) by mouth 2 (two) times daily. 60 tablet 0   Ubrogepant (UBRELVY) 100 MG TABS Take 1 tablet (100 mg total) by mouth every 2 (two) hours as needed. Maximum '200mg'$  a day. 16 tablet 11   zolmitriptan (ZOMIG) 5 MG tablet Take 1 tablet (5 mg total) by mouth as needed for migraine. Repeat in 2 hours if needed. Max 2 tablets in 24 hours. 10 tablet 0   No current facility-administered medications on file prior to visit.    BP 130/80   Pulse 78   Resp 18   Ht 5' 6.5" (1.689 m)   Wt 242 lb (109.8 kg)   SpO2 95%   BMI 38.47 kg/m        Objective:   Physical Exam  General Mental Status- Alert. General Appearance- Not in acute distress.   Skin General: Color- Normal Color. Moisture- Normal Moisture.  Neck Carotid Arteries- Normal color. Moisture- Normal Moisture. No carotid bruits. No JVD.  Chest and Lung Exam Auscultation: Breath Sounds:-Normal.  Cardiovascular Auscultation:Rythm- Regular. Murmurs & Other Heart  Sounds:Auscultation of the heart reveals- No Murmurs.  Abdomen Inspection:-Inspeection Normal. Palpation/Percussion:Note:No mass. Palpation and Percussion of the abdomen reveal- Non Tender, Non Distended + BS, no rebound or guarding.  Neurologic Cranial Nerve exam:- CN III-XII intact(No nystagmus), symmetric smile. Strength:- 5/5 equal and symmetric strength both upper and lower extremities.       Assessment & Plan:   Patient Instructions  Migraine ha and under management of neurologist. Plan in place and hopefully you will get the accomodation.  If your fmla paperwork runs out can refill paperwork.  Writing work note today covering days you missed March 28, 2022  thru April 05, 2022.  Follow up 1-2 months wellness exam or sooner if needed.    Mackie Pai, PA-C

## 2022-04-15 NOTE — Patient Instructions (Addendum)
Migraine ha and under management of neurologist. Plan in place and hopefully you will get the accomodations.  If your fmla paperwork runs out can refill paperwork.  Writing work note today covering days you missed March 28, 2022  thru April 05, 2022.  Follow up 1-2 months wellness exam or sooner if needed.

## 2022-04-16 ENCOUNTER — Other Ambulatory Visit (HOSPITAL_COMMUNITY): Payer: Self-pay

## 2022-04-17 ENCOUNTER — Telehealth: Payer: Self-pay

## 2022-04-17 DIAGNOSIS — Z0289 Encounter for other administrative examinations: Secondary | ICD-10-CM

## 2022-04-17 NOTE — Telephone Encounter (Signed)
Patient made form payment, given billing. Form brought to [employer] for completion. Patient states form is due 2/14 but would like it completed sooner if possible and would like a copy mailed to her home address.

## 2022-04-17 NOTE — Telephone Encounter (Signed)
Received a [employer] Leave and Absence Form patient. Called patient regarding form fee and had to leave voicemail asking for a call back

## 2022-04-28 ENCOUNTER — Telehealth: Payer: Self-pay | Admitting: *Deleted

## 2022-04-28 NOTE — Telephone Encounter (Signed)
Form was completed on 2/8 and sent to medical records for processing.

## 2022-04-28 NOTE — Telephone Encounter (Signed)
Pt [employer] form faxed on 04/24/2022. Copy mailed to pt

## 2022-05-08 ENCOUNTER — Other Ambulatory Visit (HOSPITAL_COMMUNITY): Payer: Self-pay

## 2022-05-08 ENCOUNTER — Other Ambulatory Visit: Payer: Self-pay | Admitting: Medical

## 2022-05-08 ENCOUNTER — Other Ambulatory Visit: Payer: Self-pay

## 2022-05-08 ENCOUNTER — Encounter (HOSPITAL_COMMUNITY): Payer: Self-pay

## 2022-05-08 ENCOUNTER — Other Ambulatory Visit: Payer: Self-pay | Admitting: Neurology

## 2022-05-08 DIAGNOSIS — G43711 Chronic migraine without aura, intractable, with status migrainosus: Secondary | ICD-10-CM

## 2022-05-08 MED ORDER — CYCLOBENZAPRINE HCL 5 MG PO TABS
5.0000 mg | ORAL_TABLET | Freq: Every evening | ORAL | 0 refills | Status: DC | PRN
  Filled 2022-05-08: qty 5, 5d supply, fill #0

## 2022-05-08 MED ORDER — AJOVY 225 MG/1.5ML ~~LOC~~ SOAJ
225.0000 mg | SUBCUTANEOUS | 1 refills | Status: DC
  Filled 2022-05-08: qty 4.5, 84d supply, fill #0
  Filled 2022-07-16: qty 4.5, 90d supply, fill #0
  Filled 2022-07-30: qty 4.5, 84d supply, fill #0
  Filled 2022-08-12: qty 4.5, 90d supply, fill #0

## 2022-05-08 MED ORDER — TOPIRAMATE 25 MG PO TABS
25.0000 mg | ORAL_TABLET | Freq: Two times a day (BID) | ORAL | 0 refills | Status: DC
  Filled 2022-05-08: qty 60, 30d supply, fill #0

## 2022-05-09 ENCOUNTER — Other Ambulatory Visit: Payer: Self-pay

## 2022-05-09 ENCOUNTER — Other Ambulatory Visit (HOSPITAL_COMMUNITY): Payer: Self-pay

## 2022-05-26 ENCOUNTER — Telehealth: Payer: Self-pay | Admitting: Neurology

## 2022-05-26 NOTE — Telephone Encounter (Signed)
Called pt to offer sooner appt as per requested on schedule request for 07-07-22 3pm.

## 2022-06-05 ENCOUNTER — Other Ambulatory Visit: Payer: Self-pay

## 2022-06-12 ENCOUNTER — Other Ambulatory Visit: Payer: Self-pay

## 2022-06-16 ENCOUNTER — Other Ambulatory Visit (HOSPITAL_COMMUNITY): Payer: Self-pay

## 2022-06-21 ENCOUNTER — Other Ambulatory Visit (HOSPITAL_COMMUNITY): Payer: Self-pay

## 2022-06-23 ENCOUNTER — Other Ambulatory Visit: Payer: Self-pay

## 2022-06-24 ENCOUNTER — Other Ambulatory Visit: Payer: Self-pay

## 2022-06-24 ENCOUNTER — Other Ambulatory Visit: Payer: Self-pay | Admitting: Medical

## 2022-06-24 MED ORDER — CYCLOBENZAPRINE HCL 5 MG PO TABS
5.0000 mg | ORAL_TABLET | Freq: Every evening | ORAL | 0 refills | Status: DC | PRN
  Filled 2022-06-24 – 2022-07-07 (×2): qty 5, 5d supply, fill #0

## 2022-06-24 MED ORDER — TOPIRAMATE 25 MG PO TABS
25.0000 mg | ORAL_TABLET | Freq: Two times a day (BID) | ORAL | 0 refills | Status: DC
  Filled 2022-06-24 – 2022-07-07 (×2): qty 60, 30d supply, fill #0

## 2022-06-25 ENCOUNTER — Encounter (HOSPITAL_COMMUNITY): Payer: Self-pay

## 2022-06-25 ENCOUNTER — Other Ambulatory Visit: Payer: Self-pay

## 2022-06-25 ENCOUNTER — Other Ambulatory Visit (HOSPITAL_COMMUNITY): Payer: Self-pay

## 2022-06-29 NOTE — Telephone Encounter (Signed)
I have old form dated 12-31-2021.  Pt sees neurologist and I remember speciaist offered to fill out similar type paperwork if not exact same and pt decline if I remember correctly. If she wants form filled out in future would prefer that neurologist fill out.

## 2022-07-01 ENCOUNTER — Other Ambulatory Visit: Payer: Self-pay

## 2022-07-04 ENCOUNTER — Other Ambulatory Visit: Payer: Self-pay

## 2022-07-07 ENCOUNTER — Telehealth: Payer: Self-pay | Admitting: Neurology

## 2022-07-07 ENCOUNTER — Other Ambulatory Visit (HOSPITAL_COMMUNITY): Payer: Self-pay

## 2022-07-07 NOTE — Telephone Encounter (Signed)
Sent mychart msg informing pt of appointment change due to provider template changing 

## 2022-07-17 ENCOUNTER — Other Ambulatory Visit: Payer: Self-pay

## 2022-07-17 ENCOUNTER — Other Ambulatory Visit (HOSPITAL_COMMUNITY): Payer: Self-pay

## 2022-07-18 ENCOUNTER — Ambulatory Visit (INDEPENDENT_AMBULATORY_CARE_PROVIDER_SITE_OTHER): Payer: No Typology Code available for payment source | Admitting: Medical

## 2022-07-18 VITALS — BP 118/90 | HR 56 | Temp 98.0°F | Resp 18 | Ht 66.0 in | Wt 239.0 lb

## 2022-07-18 DIAGNOSIS — G43701 Chronic migraine without aura, not intractable, with status migrainosus: Secondary | ICD-10-CM | POA: Diagnosis not present

## 2022-07-18 NOTE — Patient Instructions (Addendum)
Severe migraine headaches followed by neurology.  Most recent migraine on Wednesday and is currently tapering off to a very low level now.  Patient declines Toradol presently as she just took her Ajovy.  Patient has been trying to get work accommodations but was not unable to do so.  Now she is trying to be able to work from home.  Patient goal was to get FMLA paperwork filled today but on review she does not have standard paperwork.  I am asking her to get her company to fax it over and I will fill it out similarly to last time.  Follow-up date to be determined.  She notes that she has an upcoming appointment with neurologist early next month.

## 2022-07-18 NOTE — Progress Notes (Signed)
Subjective:    Patient ID: Shirley [name], female    DOB: 1975/12/21, 47 y.o.   MRN: [id number]  HPI  Pt in for follow up.  Pt has migraines that can be severe. Most recent severe ha was on Wednesday.    " Assessment/Plan:  Patient with chronic migraines and medication overuse   - Patient is here for follow-up for migraines we initially saw her in November 2023 for the same.  At the time we brought her to infusion and started her on a CGRP injectable.  She is here for follow-up.   - Patient doing great on Ajovy, will prescribe, has significantly improved from daily migraines to < 8 and < 10 days a month headaches today will discuss trying a different acute management medication such as ubrelvy or nurtec. Triptans are not working. She had a great response to Ajovy, but till having migraines and needs acute management we discussed. Also getting the migraines at work, may need special accomodations   Preventative: Ajovy (may need to change to emgality based on insurance). Fill out copay cards.  Emergency: AS SOON AS POSSIBLE try 2 strategies; Rizatriptan and ondansetron at onset: Please take one tablet at the onset of your headache. If it does not improve the symptoms please take one additional tablet.  Bernita Raisin: May take with ondansetron and Rizatriptan or alone: Please take one tablet at the onset of your headache. If it does not improve the symptoms please take one additional tablet."  Pt was trying  to get work [employer] but work did not accommodate. Now she is trying to be able to work from home.   Wed had severe migraine. Pt sates now pain is residual/almost resolved.  Pt stats has to work tomorrow from 7 am- 5 pm. Before wed her last ha was 3 weeks ago.  Pt brought fmla form for me to fill out but looks like employer portion.(Not provider form)  There is accomadation form for me to fill out but I am not filling today as want it be filled out by neurologist. I want to review  how she would fill out and the fill out similarly so  no contradictoin  Also she had disability in her packet but she states she is not trying to get disability.  Pt just took adjovi this morning so she declines toradol injection.    Review of Systems  Constitutional:  Negative for chills and fever.  HENT:  Negative for congestion, ear discharge and ear pain.   Respiratory:  Negative for cough, chest tightness, shortness of breath and wheezing.   Cardiovascular:  Negative for chest pain and palpitations.  Gastrointestinal:  Negative for abdominal pain.  Genitourinary:  Negative for dysuria, frequency and hematuria.  Musculoskeletal:  Negative for back pain, joint swelling and myalgias.  Neurological:  Positive for headaches. Negative for dizziness, speech difficulty and weakness.       Low level ha presently.  Hematological:  Negative for adenopathy. Does not bruise/bleed easily.  Psychiatric/Behavioral:  Negative for behavioral problems, confusion and decreased concentration.    Past Medical History:  Diagnosis Date   Anger    "explosive temper disorder"   Anxiety    Asthma    Depression    "extreme depression"   Lumbar back pain    Migraine    Stress    Uterine fibroids affecting pregnancy    reports having a surgical procedure -- unsure what     Social History   Socioeconomic History  Marital status: Single    Spouse name: Not on file   Number of children: 2   Years of education: Not on file   Highest education level: Not on file  Occupational History   Occupation: Sales promotion account executive  Tobacco Use   Smoking status: Former    Years: 25    Types: Cigarettes    Quit date: 12/06/2018    Years since quitting: 3.6   Smokeless tobacco: Never  Vaping Use   Vaping Use: Never used  Substance and Sexual Activity   Alcohol use: No   Drug use: No   Sexual activity: Yes    Birth control/protection: Surgical  Other Topics Concern   Not on file  Social History Narrative    Lives with 65 yr old child    Reports being ambidextrous    Caffeine: coffee 8 cups/day, soda 20 oz x 20 per day ("drink like water")      04/14/22 no soda for last 3 weeks, drinking water instead    Social Determinants of Health   Financial Resource Strain: Not on file  Food Insecurity: Not on file  Transportation Needs: Not on file  Physical Activity: Not on file  Stress: Not on file  Social Connections: Not on file  Intimate Partner Violence: Not on file    Past Surgical History:  Procedure Laterality Date   ABDOMINAL HYSTERECTOMY     ABDOMINAL HYSTERECTOMY     pt states she cannot confirm or deny that she had this pressure   MYOMECTOMY     TONSILLECTOMY      Family History  Problem Relation Age of Onset   Pancreatic cancer Father    Bipolar disorder Sister    Liver cancer Sister    Schizophrenia Sister    Epilepsy Sister    Migraines Maternal Aunt    Kidney cancer Maternal Uncle    Alcoholism Maternal Uncle    Schizophrenia Paternal Aunt    Bipolar disorder Paternal Aunt    Schizophrenia Son    Ataxia Neg Hx    Chorea Neg Hx    Dementia Neg Hx    Mental retardation Neg Hx    Multiple sclerosis Neg Hx    Neurofibromatosis Neg Hx    Neuropathy Neg Hx    Parkinsonism Neg Hx    Seizures Neg Hx    Stroke Neg Hx     Allergies  Allergen Reactions   Other Anaphylaxis    IV CT Scan Contrast   Isovue [Iopamidol] Itching    Eye redness/swelling and itching post contrast, hives on upper back   Latex Rash    Current Outpatient Medications on File Prior to Visit  Medication Sig Dispense Refill   cyclobenzaprine (FLEXERIL) 5 MG tablet Take 1 tablet (5 mg total) by mouth at bedtime as needed for trapezius pain with headache. 5 tablet 0   Fremanezumab-vfrm (AJOVY) 225 MG/1.5ML SOAJ Inject 225 mg into the skin every 30 (thirty) days. 1.5 mL 11   Fremanezumab-vfrm (AJOVY) 225 MG/1.5ML SOAJ Inject 225 mg into the skin every 30 (thirty) days. 4.5 mL 1   ondansetron  (ZOFRAN-ODT) 4 MG disintegrating tablet Take 1-2 tablets (4-8 mg total) by mouth every 8 (eight) hours as needed. 30 tablet 3   rizatriptan (MAXALT-MLT) 10 MG disintegrating tablet Take 1 tablet (10 mg total) by mouth as needed for migraine. May repeat in 2 hours if needed 9 tablet 11   topiramate (TOPAMAX) 25 MG tablet Take 1 tablet (25 mg total) by  mouth 2 (two) times daily. 60 tablet 0   Ubrogepant (UBRELVY) 100 MG TABS Take 1 tablet (100 mg total) by mouth every 2 (two) hours as needed. Maximum 200mg  a day. 16 tablet 11   zolmitriptan (ZOMIG) 5 MG tablet Take 1 tablet (5 mg total) by mouth as needed for migraine. Repeat in 2 hours if needed. Max 2 tablets in 24 hours. 10 tablet 0   No current facility-administered medications on file prior to visit.    BP (!) 118/90   Pulse (!) 56   Temp 98 F (36.7 C)   Resp 18   Ht 5\' 6"  (1.676 m)   Wt 239 lb (108.4 kg)   SpO2 98%   BMI 38.58 kg/m        Objective:   Physical Exam  General Mental Status- Alert. General Appearance- Not in acute distress.   Skin General: Color- Normal Color. Moisture- Normal Moisture.  Neck Carotid Arteries- Normal color. Moisture- Normal Moisture. No carotid bruits. No JVD.  Chest and Lung Exam Auscultation: Breath Sounds:-Normal.  Cardiovascular Auscultation:Rythm- Regular. Murmurs & Other Heart Sounds:Auscultation of the heart reveals- No Murmurs.   Neurologic Cranial Nerve exam:- CN III-XII intact(No nystagmus), symmetric smile. Strength:- 5/5 equal and symmetric strength both upper and lower extremities.       Assessment & Plan:   Patient Instructions  Severe migraine headaches followed by neurology.  Most recent migraine on Wednesday and is currently tapering off to a very low level now.  Patient declines Toradol presently as she just took her Ajovy.  Patient has been trying to get work accommodations but was not unable to do so.  Now she is trying to be able to work from  home.  Patient goal was to get FMLA paperwork filled today but on review she does not have standard paperwork.  I am asking her to get her company to fax it over and I will fill it out similarly to last time.  Follow-up date to be determined.  She notes that she has an upcoming appointment with neurologist early next month.   Esperanza Richters, PA-C

## 2022-07-21 ENCOUNTER — Encounter: Payer: Self-pay | Admitting: Medical

## 2022-07-23 ENCOUNTER — Other Ambulatory Visit (HOSPITAL_COMMUNITY): Payer: Self-pay

## 2022-07-23 ENCOUNTER — Encounter: Payer: Self-pay | Admitting: Neurology

## 2022-07-30 ENCOUNTER — Other Ambulatory Visit: Payer: Self-pay

## 2022-07-30 ENCOUNTER — Other Ambulatory Visit: Payer: Self-pay | Admitting: Medical

## 2022-07-30 ENCOUNTER — Other Ambulatory Visit (HOSPITAL_COMMUNITY): Payer: Self-pay

## 2022-07-30 MED ORDER — TOPIRAMATE 25 MG PO TABS
25.0000 mg | ORAL_TABLET | Freq: Two times a day (BID) | ORAL | 0 refills | Status: DC
  Filled 2022-07-30 – 2022-07-31 (×2): qty 60, 30d supply, fill #0

## 2022-07-30 MED ORDER — CYCLOBENZAPRINE HCL 5 MG PO TABS
5.0000 mg | ORAL_TABLET | Freq: Every evening | ORAL | 0 refills | Status: DC | PRN
  Filled 2022-07-30: qty 5, 5d supply, fill #0

## 2022-07-31 ENCOUNTER — Other Ambulatory Visit: Payer: Self-pay

## 2022-07-31 ENCOUNTER — Other Ambulatory Visit (HOSPITAL_COMMUNITY): Payer: Self-pay

## 2022-08-09 ENCOUNTER — Other Ambulatory Visit (HOSPITAL_COMMUNITY): Payer: Self-pay

## 2022-08-12 ENCOUNTER — Other Ambulatory Visit (HOSPITAL_COMMUNITY): Payer: Self-pay

## 2022-08-12 ENCOUNTER — Other Ambulatory Visit: Payer: Self-pay

## 2022-08-13 ENCOUNTER — Other Ambulatory Visit (HOSPITAL_COMMUNITY): Payer: Self-pay

## 2022-08-14 ENCOUNTER — Emergency Department (HOSPITAL_BASED_OUTPATIENT_CLINIC_OR_DEPARTMENT_OTHER)
Admission: EM | Admit: 2022-08-14 | Discharge: 2022-08-14 | Disposition: A | Discharge status: Home / Self Care | Payer: No Typology Code available for payment source | Attending: Emergency Medicine | Admitting: Emergency Medicine

## 2022-08-14 ENCOUNTER — Encounter (HOSPITAL_BASED_OUTPATIENT_CLINIC_OR_DEPARTMENT_OTHER): Payer: Self-pay | Admitting: Emergency Medicine

## 2022-08-14 DIAGNOSIS — Z87891 Personal history of nicotine dependence: Secondary | ICD-10-CM | POA: Diagnosis not present

## 2022-08-14 DIAGNOSIS — J45909 Unspecified asthma, uncomplicated: Secondary | ICD-10-CM | POA: Diagnosis not present

## 2022-08-14 DIAGNOSIS — G43909 Migraine, unspecified, not intractable, without status migrainosus: Secondary | ICD-10-CM | POA: Insufficient documentation

## 2022-08-14 MED ORDER — KETOROLAC TROMETHAMINE 15 MG/ML IJ SOLN
15.0000 mg | Freq: Once | INTRAMUSCULAR | Status: AC
  Administered 2022-08-14: 15 mg via INTRAVENOUS
  Filled 2022-08-14: qty 1

## 2022-08-14 MED ORDER — PROCHLORPERAZINE EDISYLATE 10 MG/2ML IJ SOLN
10.0000 mg | Freq: Once | INTRAMUSCULAR | Status: AC
  Administered 2022-08-14: 10 mg via INTRAVENOUS
  Filled 2022-08-14: qty 2

## 2022-08-14 MED ORDER — SODIUM CHLORIDE 0.9 % IV BOLUS
1000.0000 mL | Freq: Once | INTRAVENOUS | Status: AC
  Administered 2022-08-14: 1000 mL via INTRAVENOUS

## 2022-08-14 MED ORDER — DIPHENHYDRAMINE HCL 50 MG/ML IJ SOLN
25.0000 mg | Freq: Once | INTRAMUSCULAR | Status: AC
  Administered 2022-08-14: 25 mg via INTRAVENOUS
  Filled 2022-08-14: qty 1

## 2022-08-14 NOTE — ED Provider Notes (Signed)
Patient was initially seen by Dr. Pilar Plate.  Patient presented with symptoms suggestive of a migraine headache.  She does have history of migraines.  Patient states her headache is feeling better.  She has been able to get up walk around and feels well enough to go home at this time.   Linwood Dibbles, MD 08/14/22 [phone]

## 2022-08-14 NOTE — ED Triage Notes (Signed)
Pt states has Hx of migraines and is followed for same. Has had new episode for 2 days, home meds have not helped. Headache, neck pain radiation down left arm.

## 2022-08-14 NOTE — ED Provider Notes (Signed)
[facility] Emergency Department Provider Note MRN:  [id number]  Arrival date & time: 08/14/22     Chief Complaint   Migraine   History of Present Illness   Shirley [name] is a 47 y.o. year-old female with a history of migraines presenting to the ED with chief complaint of migraine.  Gradual onset headache 2 days ago, not going away.  Typical of her migraines in the past but lasting a bit longer.  No numbness or weakness to the arms or legs, no fever.  Review of Systems  A thorough review of systems was obtained and all systems are negative except as noted in the HPI and PMH.   Patient's Health History    Past Medical History:  Diagnosis Date   Anger    "explosive temper disorder"   Anxiety    Asthma    Depression    "extreme depression"   Lumbar back pain    Migraine    Stress    Uterine fibroids affecting pregnancy    reports having a surgical procedure -- unsure what    Past Surgical History:  Procedure Laterality Date   ABDOMINAL HYSTERECTOMY     ABDOMINAL HYSTERECTOMY     pt states she cannot confirm or deny that she had this pressure   MYOMECTOMY     TONSILLECTOMY      Family History  Problem Relation Age of Onset   Pancreatic cancer Father    Bipolar disorder Sister    Liver cancer Sister    Schizophrenia Sister    Epilepsy Sister    Migraines Maternal Aunt    Kidney cancer Maternal Uncle    Alcoholism Maternal Uncle    Schizophrenia Paternal Aunt    Bipolar disorder Paternal Aunt    Schizophrenia Son    Ataxia Neg Hx    Chorea Neg Hx    Dementia Neg Hx    Mental retardation Neg Hx    Multiple sclerosis Neg Hx    Neurofibromatosis Neg Hx    Neuropathy Neg Hx    Parkinsonism Neg Hx    Seizures Neg Hx    Stroke Neg Hx     Social History   Socioeconomic History   Marital status: Single    Spouse name: Not on file   Number of children: 2   Years of education: Not on file   Highest education level: Not on file   Occupational History   Occupation: Sales promotion account executive  Tobacco Use   Smoking status: Former    Years: 25    Types: Cigarettes    Quit date: 12/06/2018    Years since quitting: 3.6   Smokeless tobacco: Never  Vaping Use   Vaping Use: Never used  Substance and Sexual Activity   Alcohol use: No   Drug use: No   Sexual activity: Yes    Birth control/protection: Surgical  Other Topics Concern   Not on file  Social History Narrative   Lives with 79 yr old child    Reports being ambidextrous    Caffeine: coffee 8 cups/day, soda 20 oz x 20 per day ("drink like water")      04/14/22 no soda for last 3 weeks, drinking water instead    Social Determinants of Health   Financial Resource Strain: Not on file  Food Insecurity: Not on file  Transportation Needs: Not on file  Physical Activity: Not on file  Stress: Not on file  Social Connections: Not on file  Intimate Partner Violence: Not on file     Physical Exam   Vitals:   08/14/22 0633  BP: (!) 138/97  Pulse: 75  Resp: 18  Temp: 98.4 F (36.9 C)  SpO2: 98%    CONSTITUTIONAL: Well-appearing, NAD NEURO/PSYCH:  Alert and oriented x 3, normal and symmetric strength and sensation, normal coordination, normal speech EYES:  eyes equal and reactive ENT/NECK:  no LAD, no JVD CARDIO: Regular rate, well-perfused, normal S1 and S2 PULM:  CTAB no wheezing or rhonchi GI/GU:  non-distended, non-tender MSK/SPINE:  No gross deformities, no edema SKIN:  no rash, atraumatic   *Additional and/or pertinent findings included in MDM below  Diagnostic and Interventional Summary    EKG Interpretation  Date/Time:    Ventricular Rate:    PR Interval:    QRS Duration:   QT Interval:    QTC Calculation:   R Axis:     Text Interpretation:         Labs Reviewed - No data to display  No orders to display    Medications  sodium chloride 0.9 % bolus 1,000 mL (has no administration in time range)  prochlorperazine (COMPAZINE)  injection 10 mg (has no administration in time range)  diphenhydrAMINE (BENADRYL) injection 25 mg (has no administration in time range)  ketorolac (TORADOL) 15 MG/ML injection 15 mg (has no administration in time range)     Procedures  /  Critical Care Procedures  ED Course and Medical Decision Making  Initial Impression and Ddx Suspect migraine, history of the same.  No obvious complicating features, reassuring exam.  Providing migraine cocktail and will reassess.  Past medical/surgical history that increases complexity of ED encounter: Migraines  Interpretation of Diagnostics Laboratory and/or imaging options to aid in the diagnosis/care of the patient were considered.  After careful history and physical examination, it was determined that there was no indication for diagnostics at this time.  Patient Reassessment and Ultimate Disposition/Management     Signed out to oncoming provider pending reassessment.  Patient management required discussion with the following services or consulting groups:  None  Complexity of Problems Addressed Acute illness or injury that poses threat of life of bodily function  Additional Data Reviewed and Analyzed Further history obtained from: None  Additional Factors Impacting ED Encounter Risk None  Elmer Sow. Pilar Plate, MD [facility] Emergency Medicine [facility] [email or web address]  Final Clinical Impressions(s) / ED Diagnoses     ICD-10-CM   1. Migraine without status migrainosus, not intractable, unspecified migraine type  G43.909       ED Discharge Orders     None        Discharge Instructions Discussed with and Provided to Patient:   Discharge Instructions   None      Sabas Sous, MD 08/14/22 [phone]

## 2022-08-14 NOTE — Discharge Instructions (Signed)
Continue your current medications.  Return to the ED as needed for recurrent symptoms °

## 2022-08-15 ENCOUNTER — Other Ambulatory Visit: Payer: Self-pay

## 2022-08-18 ENCOUNTER — Telehealth: Payer: No Typology Code available for payment source | Admitting: Neurology

## 2022-08-20 ENCOUNTER — Telehealth: Payer: No Typology Code available for payment source | Admitting: Neurology

## 2022-08-20 ENCOUNTER — Other Ambulatory Visit (HOSPITAL_COMMUNITY): Payer: Self-pay

## 2022-08-20 DIAGNOSIS — G43711 Chronic migraine without aura, intractable, with status migrainosus: Secondary | ICD-10-CM | POA: Diagnosis not present

## 2022-08-20 MED ORDER — AJOVY 225 MG/1.5ML ~~LOC~~ SOAJ
225.0000 mg | SUBCUTANEOUS | 11 refills | Status: AC
Start: 2022-08-20 — End: ?
  Filled 2022-08-20: qty 1.5, 28d supply, fill #0
  Filled 2022-09-15: qty 1.5, 28d supply, fill #1
  Filled 2022-10-12 – 2022-10-23 (×2): qty 1.5, 28d supply, fill #2
  Filled 2022-11-20: qty 1.5, 28d supply, fill #3
  Filled 2022-12-17: qty 1.5, 28d supply, fill #4
  Filled 2023-01-01 – 2023-01-27 (×3): qty 1.5, 28d supply, fill #5
  Filled 2023-02-12 – 2023-02-20 (×2): qty 1.5, 28d supply, fill #6
  Filled 2023-03-20 – 2023-07-11 (×6): qty 1.5, 28d supply, fill #7

## 2022-08-20 NOTE — Patient Instructions (Signed)
Do not get pregnant for 6 months until after stopping  Fremanezumab Injection What is this medication? FREMANEZUMAB (fre ma NEZ ue mab) prevents migraines. It works by blocking a substance in the body that causes migraines. It is a monoclonal antibody. This medicine may be used for other purposes; ask your health care provider or pharmacist if you have questions. COMMON BRAND NAME(S): AJOVY What should I tell my care team before I take this medication? They need to know if you have any of these conditions: An unusual or allergic reaction to fremanezumab, other medications, foods, dyes, or preservatives Pregnant or trying to get pregnant Breast-feeding How should I use this medication? This medication is injected under the skin. You will be taught how to prepare and give it. Take it as directed on the prescription label. Keep taking it unless your care team tells you to stop. It is important that you put your used needles and syringes in a special sharps container. Do not put them in a trash can. If you do not have a sharps container, call your pharmacist or care team to get one. Talk to your care team about the use of this medication in children. Special care may be needed. Overdosage: If you think you have taken too much of this medicine contact a poison control center or emergency room at once. NOTE: This medicine is only for you. Do not share this medicine with others. What if I miss a dose? If you miss a dose, take it as soon as you can. If it is almost time for your next dose, take only that dose. Do not take double or extra doses. What may interact with this medication? Interactions are not expected. This list may not describe all possible interactions. Give your health care provider a list of all the medicines, herbs, non-prescription drugs, or dietary supplements you use. Also tell them if you smoke, drink alcohol, or use illegal drugs. Some items may interact with your medicine. What  should I watch for while using this medication? Tell your care team if your symptoms do not start to get better or if they get worse. What side effects may I notice from receiving this medication? Side effects that you should report to your care team as soon as possible: Allergic reactions or angioedema--skin rash, itching or hives, swelling of the face, eyes, lips, tongue, arms, or legs, trouble swallowing or breathing Side effects that usually do not require medical attention (report to your care team if they continue or are bothersome): Pain, redness, or irritation at injection site This list may not describe all possible side effects. Call your doctor for medical advice about side effects. You may report side effects to FDA at 1-800-FDA-1088. Where should I keep my medication? Keep out of the reach of children and pets. Store in a refrigerator or at room temperature between 20 and 25 degrees C (68 and 77 degrees F). Refrigeration (preferred): Store in the refrigerator. Do not freeze. Keep in the original container until you are ready to take it. Remove the dose from the carton about 30 minutes before it is time for you to use it. If the dose is not used, it may be stored in the original container at room temperature for 7 days. Get rid of any unused medication after the expiration date. Room Temperature: This medication may be stored at room temperature for up to 7 days. Keep it in the original container. Protect from light until time of use. If it  is stored at room temperature, get rid of any unused medication after 7 days or after it expires, whichever is first. To get rid of medications that are no longer needed or have expired: Take the medication to a medication take-back program. Check with your pharmacy or law enforcement to find a location. If you cannot return the medication, ask your pharmacist or care team how to get rid of this medication safely. NOTE: This sheet is a summary. It may  not cover all possible information. If you have questions about this medicine, talk to your doctor, pharmacist, or health care provider.  2024 Elsevier/Gold Standard (2021-04-26 00:00:00)

## 2022-08-20 NOTE — Progress Notes (Addendum)
[facility]    Provider:  Dr Lucia Gaskins Requesting Provider: Marisue Brooklyn Primary Care Provider:  Esperanza Richters, PA-C  Virtual Visit via Video Note  I connected with Shirley [name] on 08/25/22 at  2:00 PM EDT by a video enabled telemedicine application and verified that I am speaking with the correct person using two identifiers.  Location: Patient: home Provider: office   I discussed the limitations of evaluation and management by telemedicine and the availability of in person appointments. The patient expressed understanding and agreed to proceed.   Follow Up Instructions:    I discussed the assessment and treatment plan with the patient. The patient was provided an opportunity to ask questions and all were answered. The patient agreed with the plan and demonstrated an understanding of the instructions.   The patient was advised to call back or seek an in-person evaluation if the symptoms worsen or if the condition fails to improve as anticipated.  I provided 30 minutes of non-face-to-face time during this encounter.   Anson Fret, MD   CC:  migraines  08/20/2022: She was doing great on the Ajovy. When she doesn't have it she is in trouble. She has requested it from [place] but having problems. Will leave her some samples. She does great with it. Will leave her 6 months of samples up front.  She had to miss work. When she is on Ajovy she does great. It is wonderful she is good no problems doesn't even have to use acute meds. I placed 6 months at the front desk for her so we can figure it out.   Need to fix FMLA: She was out wed may 26th, Thursday 27th, and her primary gives her 2 whole days a month and also needs 3 days a month FMLA for absencess for illness.  Sent a message to pcp because they don;t charge, and this is fine for [place].   We represcribed the Ajovy and filled out copay card and put it in the pescrition will call [place] and  make sure it gets filled  She says she feels better on Ajovy in 5 minutes.  Patient complains of symptoms per HPI as well as the following symptoms: migraine . Pertinent negatives and positives per HPI. All others negative   Follow-up April 14, 2022: Patient is here for follow-up for migraines we initially saw her in November 2023 for the same.  At the time we brought her to infusion and started her on a CGRP injectable.  She is here for follow-up.  Patient doing great on Ajovy, will prescribe, has significantly improved from daily migraines to < 8 and < 10 days a month headaches today will discuss trying a different acute management medication such as ubrelvy or nurtec. Triptans are not working. She had a great response to Ajovy, but till having migraines and needs acute management we discussed. Also getting the migraines at work, may need special accomodations  Patient complains of symptoms per HPI as well as the following symptoms: migraines . Pertinent negatives and positives per HPI. All others negative  HPI 01/15/2022:  Shirley [name] is a 47 y.o. female here as requested by Esperanza Richters, PA-C for migraines. Migraines started at the age of 40, her aunt had migraines. She has daily migraines. She is using excedrin and goes through a big bottle every 2 weeks. Migraines lasting 24 hours every day. They are pulsating/pounding/throbbing, photophobia/phonophobia,nausea, hurts to move, nausea, dizziness. Stress can make them  worse, not sleeping. Excedrin helps temporarily. Not positional or exertional no vision changes. She is on medical leave, being on computer screen can make it worse. No other focal neurologic deficits, associated symptoms, inciting events or modifiable factors.  Reviewed notes, labs and imaging from outside physicians, which showed:  From a thorough review of records, medications tried that can be used in migraine and headache management include: Tylenol, amlodipine, aspirin,  Fioricet, Flexeril, Decadron, Benadryl, gabapentin, ibuprofen, ketorolac, Reglan, Robaxin, Naprosyn, nortriptyline, sumatriptan, Topamax, zolmitriptan, maxalt, ondansetron, ubrelvy, aimovig contraindicated due to constipation, Ajovy extremely effective. Cannot tolerate propranolol due to bradycardia and usually has low blood pressure today in pain and is elevated.   12/31/2021: EXAM: CT HEAD WITHOUT CONTRAST   CT CERVICAL SPINE WITHOUT CONTRAST   TECHNIQUE: Multidetector CT imaging of the head and cervical spine was performed following the standard protocol without intravenous contrast. Multiplanar CT image reconstructions of the cervical spine were also generated.   RADIATION DOSE REDUCTION: This exam was performed according to the departmental dose-optimization program which includes automated exposure control, adjustment of the mA and/or kV according to patient size and/or use of iterative reconstruction technique.   COMPARISON:  Head CT 12/08/2018.   FINDINGS: CT HEAD FINDINGS   Brain: There is no evidence of an acute infarct, intracranial hemorrhage, mass, midline shift, or extra-axial fluid collection. The ventricles and sulci are normal.   Vascular: No hyperdense vessel.   Skull: No fracture or suspicious osseous lesion.   Sinuses/Orbits: Paranasal sinuses and mastoid air cells are clear. Unremarkable orbits.   Other: None.   CT CERVICAL SPINE FINDINGS   Alignment: Cervical spine straightening. Trace retrolisthesis of C3 on C4, likely degenerative.   Skull base and vertebrae: No acute fracture or suspicious osseous lesion.   Soft tissues and spinal canal: No prevertebral fluid or swelling. No visible canal hematoma.   Disc levels: Mild cervical spondylosis with degenerative endplate spurring being most notable at C5-6 and C6-7. No evidence of high-grade stenosis.   Upper chest: Clear lung apices.   Other: None.   IMPRESSION: No evidence of acute  intracranial abnormality or cervical spine fracture.  Review of Systems: Patient complains of symptoms per HPI as well as the following symptoms migraines. Pertinent negatives and positives per HPI. All others negative.   Social History   Socioeconomic History   Marital status: Single    Spouse name: Not on file   Number of children: 2   Years of education: Not on file   Highest education level: Not on file  Occupational History   Occupation: Sales promotion account executive  Tobacco Use   Smoking status: Former    Years: 25    Types: Cigarettes    Quit date: 12/06/2018    Years since quitting: 3.7   Smokeless tobacco: Never  Vaping Use   Vaping Use: Never used  Substance and Sexual Activity   Alcohol use: No   Drug use: No   Sexual activity: Yes    Birth control/protection: Surgical  Other Topics Concern   Not on file  Social History Narrative   Lives with 51 yr old child    Reports being ambidextrous    Caffeine: coffee 8 cups/day, soda 20 oz x 20 per day ("drink like water")      04/14/22 no soda for last 3 weeks, drinking water instead    Social Determinants of Health   Financial Resource Strain: Not on file  Food Insecurity: Not on file  Transportation Needs: Not on file  Physical Activity: Not on file  Stress: Not on file  Social Connections: Not on file  Intimate Partner Violence: Not on file    Family History  Problem Relation Age of Onset   Pancreatic cancer Father    Bipolar disorder Sister    Liver cancer Sister    Schizophrenia Sister    Epilepsy Sister    Migraines Maternal Aunt    Kidney cancer Maternal Uncle    Alcoholism Maternal Uncle    Schizophrenia Paternal Aunt    Bipolar disorder Paternal Aunt    Schizophrenia Son    Ataxia Neg Hx    [place] Neg Hx    Dementia Neg Hx    Mental retardation Neg Hx    Multiple sclerosis Neg Hx    Neurofibromatosis Neg Hx    Neuropathy Neg Hx    Parkinsonism Neg Hx    Seizures Neg Hx    Stroke Neg Hx      Past Medical History:  Diagnosis Date   Anger    "explosive temper disorder"   Anxiety    Asthma    Depression    "extreme depression"   Lumbar back pain    Migraine    Stress    Uterine fibroids affecting pregnancy    reports having a surgical procedure -- unsure what    Patient Active Problem List   Diagnosis Date Noted   Chronic migraine without aura, with intractable migraine, so stated, with status migrainosus 01/15/2022   Syncope 12/10/2018   Obesity, Class III, BMI 40-49.9 (morbid obesity) (HCC) 12/10/2018   Upper and lower extremity pain 03/16/2016   Lumbar radiculopathy 06/11/2015   Medication overuse headache 06/02/2013   Essential hypertension 06/02/2013    Past Surgical History:  Procedure Laterality Date   ABDOMINAL HYSTERECTOMY     ABDOMINAL HYSTERECTOMY     pt states she cannot confirm or deny that she had this pressure   MYOMECTOMY     TONSILLECTOMY      Current Outpatient Medications  Medication Sig Dispense Refill   cyclobenzaprine (FLEXERIL) 5 MG tablet Take 1 tablet (5 mg total) by mouth at bedtime as needed for trapezius pain with headache. 5 tablet 0   Fremanezumab-vfrm (AJOVY) 225 MG/1.5ML SOAJ Inject 225 mg into the skin every 30 (thirty) days. 1.5 mL 11   ondansetron (ZOFRAN-ODT) 4 MG disintegrating tablet Take 1-2 tablets (4-8 mg total) by mouth every 8 (eight) hours as needed. 30 tablet 3   rizatriptan (MAXALT-MLT) 10 MG disintegrating tablet Take 1 tablet (10 mg total) by mouth as needed for migraine. May repeat in 2 hours if needed 9 tablet 11   topiramate (TOPAMAX) 25 MG tablet Take 1 tablet (25 mg total) by mouth 2 (two) times daily. 60 tablet 0   Ubrogepant (UBRELVY) 100 MG TABS Take 1 tablet (100 mg total) by mouth every 2 (two) hours as needed. Maximum 200mg  a day. 16 tablet 11   zolmitriptan (ZOMIG) 5 MG tablet Take 1 tablet (5 mg total) by mouth as needed for migraine. Repeat in 2 hours if needed. Max 2 tablets in 24 hours. 10  tablet 0   No current facility-administered medications for this visit.    Allergies as of 08/20/2022 - Review Complete 08/14/2022  Allergen Reaction Noted   Other Anaphylaxis 11/15/2021   Isovue [iopamidol] Itching 01/25/2016   Latex Rash 12/09/2012    Vitals: LMP  (LMP Unknown)  Last Weight:  Wt Readings from Last 1 Encounters:  08/14/22 212 lb (96.2  kg)   Last Height:   Ht Readings from Last 1 Encounters:  08/14/22 5\' 6"  (1.676 m)   Physical exam: Exam: Gen: NAD, conversant      CV: nopalpitations or chest pain or SOB. VS: Breathing at a normal rate. Not febrile. Eyes: Conjunctivae clear without exudates or hemorrhage  Neuro: Detailed Neurologic Exam  Speech:    Speech is normal; fluent and spontaneous with normal comprehension.  Cognition:    The patient is oriented to person, place, and time;     recent and remote memory intact;     language fluent;     normal attention, concentration,     fund of knowledge Cranial Nerves:    The pupils are equal, round, and reactive to light. Visual fields are full . Extraocular movements are intact.  The face is symmetric with normal sensation. The palate elevates in the midline. Hearing intact. Voice is normal. Shoulder shrug is normal. The tongue has normal motion without fasciculations.   Coordination:    Normal finger to nose  Gait:    Normal native gait  Motor Observation:   no involuntary movements noted. Tone:    Appears normal  Posture:    Posture is normal. normal erect    Strength:    Strength is anti-gravity and symmetric in the upper and lower limbs.        Assessment/Plan:  Patient with chronic migraines and medication overuse  - Patient is here for follow-up for migraines we initially saw her in November 2023 for the same.  At the time we brought her to infusion and started her on a CGRP injectable.  She is here for follow-up.    08/20/2022: She was doing great on the Ajovy. When she doesn't have it  she is in trouble. She has requested it from [place] but having problems. Will leave her some samples. She does great with it. Will leave her 6 months of samples up front.  She had to miss work. When she is on Ajovy she does great. It is wonderful she is good no problems doesn't even have to use acute meds. I placed 6 months at the front desk for her so we can figure it out.  Right now she has > 15 migraine days a month and daily headaches. When on Ajovy she has 4 migraine days a month and < 6 total headache days a month and uses [place].   Need to fix FMLA: She was out wed may 26th, Thursday 27th, and her primary gives her 2 whole days a month and also needs 3 days a month FMLA for absencess for illness.  Sent a message to pcp because they don;t charge, and this is fine for [place].   We represcribed the Ajovy and filled out copay card and put it in the pescrition today and I called [place] and make sure it gets filled  She says she feels better on Ajovy in 5 minutes.  Patient complains of symptoms per HPI as well as the following symptoms: migraine . Pertinent negatives and positives per HPI. All others negative  Preventative: Ajovy (may need to change to emgality based on insurance). Fill out copay cards.  Emergency: AS SOON AS POSSIBLE try 2 strategies; Rizatriptan and ondansetron at onset: Please take one tablet at the onset of your headache. If it does not improve the symptoms please take one additional tablet.  Bernita Raisin: May take with ondansetron and Rizatriptan or alone: Please take one tablet at  the onset of your headache. If it does not improve the symptoms please take one additional tablet.   Ondansetron: Nausea, can take alone for nause or take with Rizatriptan and Stark Bray: Will come from [facility], they will call   Computer at work: [employer] and can try FL-41 glasses for light sensitivity  Letter for accomodations:  patient is under my care for chronic  migraines. Migraines can be triggered by both physical as well as environmental factors. I recommend an ergonomic work desk with a sit-stand feature to allow for her neck and back to not be strained as this can trigger a migraine. If possible, please provide a screen dimmer/anti-flare filter/blue light filter for her computer monitor to decrease the brightness of the screen. Providing natural light and limiting fluorescent lighting or dimming that type of lighting will also be beneficial as fluorescent lighting can trigger migraines as well. If possible, please allow patient to wear sunglasses or anti-glare glasses(fl-41, theraspecs etc) in the work area. Also a telephone headset may be beneficial if patient is on the phone a considerable amount of time. Encouragement of a fragrance-free environment may help as well. Any possibility of working from home should be considered.   Meds ordered this encounter  Medications   Fremanezumab-vfrm (AJOVY) 225 MG/1.5ML SOAJ    Sig: Inject 225 mg into the skin every 30 (thirty) days.    Dispense:  1.5 mL    Refill:  11    BIN#: [id number] PCN#: [id number] GROUP#: [id number] ID#: [id number] EXPIRES: 03/17/2023   To prevent or relieve headaches, try the following: Cool Compress. Lie down and place a cool compress on your head.  Avoid headache triggers. If certain foods or odors seem to have triggered your migraines in the past, avoid them. A headache diary might help you identify triggers.  Include physical activity in your daily routine. Try a daily walk or other moderate aerobic exercise.  Manage stress. Find healthy ways to cope with the stressors, such as delegating tasks on your to-do list.  Practice relaxation techniques. Try deep breathing, yoga, massage and visualization.  Eat regularly. Eating regularly scheduled meals and maintaining a healthy diet might help prevent headaches. Also, drink plenty of fluids.  Follow a regular sleep schedule. Sleep deprivation  might contribute to headaches Consider biofeedback. With this mind-body technique, you learn to control certain bodily functions -- such as muscle tension, heart rate and blood pressure -- to prevent headaches or reduce headache pain.    Proceed to emergency room if you experience new or worsening symptoms or symptoms do not resolve, if you have new neurologic symptoms or if headache is severe, or for any concerning symptom.   Provided education and documentation from American headache Society toolbox including articles on: chronic migraine medication overuse headache, chronic migraines, prevention of migraines, behavioral and other nonpharmacologic treatments for headache.  Cc: Saguier, Kateri Mc,  Saguier, Ramon Dredge, PA-C  Naomie Dean, MD  [facility] [address] [place], [place] [address]  Phone [phone] Fax [phone]

## 2022-08-21 ENCOUNTER — Other Ambulatory Visit: Payer: Self-pay

## 2022-08-22 ENCOUNTER — Telehealth: Payer: Self-pay

## 2022-08-22 NOTE — Telephone Encounter (Signed)
-----   Message from Esperanza Richters, PA-C sent at 08/21/2022 11:50 AM EDT ----- Regarding: FW: can you fill out FMLA for this patient for financial reaons please? Let pt know I can fill out her fmla on office visit. Dr Lucia Gaskins is deferring to me. I will charge if not done during office visit. But usually don't have time after hours so she needs appointment. Also want to discuss days off as neurologist states 3 days per month. Want pt to be aware/on same page. ----- Message ----- From: Anson Fret, MD Sent: 08/20/2022   2:35 PM EDT To: Esperanza Richters, PA-C Subject: can you fill out FMLA for this patient for f#  Ramon Dredge, We charge $50 for FMLA and you don;t charge, would you mind filling out hr FMLA so she can save $50? I'm ok with 3 days FMLA every month for illness and she was out wed may 26th, Thursday 27th and that needs to be noted so she doesn't get any points. Would you mind? Thanks, Dr. Lucia Gaskins - Sheralyn Boatman

## 2022-08-22 NOTE — Telephone Encounter (Signed)
Sent pt a mychart message. 

## 2022-08-27 ENCOUNTER — Ambulatory Visit (INDEPENDENT_AMBULATORY_CARE_PROVIDER_SITE_OTHER): Payer: No Typology Code available for payment source | Admitting: Medical

## 2022-08-27 VITALS — BP 152/72 | HR 56 | Resp 18 | Ht 66.0 in | Wt 237.0 lb

## 2022-08-27 DIAGNOSIS — G43711 Chronic migraine without aura, intractable, with status migrainosus: Secondary | ICD-10-CM

## 2022-08-27 NOTE — Patient Instructions (Signed)
Migraine Headaches: Improved frequency and duration with current regimen (Ajovy and Topamax). Noted exacerbation of symptoms in office environment due to triggers (fluorescent light, noise, perfume). Recent missed doses of Ajovy led to increased frequency of migraines and missed work days. -Continue Ajovy and Topamax as prescribed by neurologist. -Use Flexeril as needed for tension headache component. -Completed accommodation request form for employer to consider changes in office environment or allow work from home. -Check in with employer regarding accommodation request. -Follow up in 2 months or sooner if needed, and maintain appointments with neurologist.

## 2022-08-27 NOTE — Progress Notes (Signed)
Subjective:    Patient ID: Shirley [name], female    DOB: Apr 16, 1975, 47 y.o.   MRN: [id number]  HPI Discussed the use of AI scribe software for clinical note transcription with the patient, who gave verbal consent to proceed.  History of Present Illness   The patient, with a history of chronic migraines, reports a recent exacerbation of symptoms due to an issue with medication availability. She had been managing her migraines effectively with a combination of Ajovy injections and supplemental medications. However, she ran out of Ajovy for approximately which led to a resurgence of frequent and intense migraines. The supplemental medication was not as effective in controlling the migraines as the Ajovy injections.  The patient's migraines are triggered by various environmental factors at her workplace, including fluorescent lighting, noise, and perfume. 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 However, upon returning to the office, her migraines worsened due to the aforementioned triggers.  The patient's migraines are severe enough to cause missed work and necessitate emergency room visits. She recently missed four days of work due to her migraines, including two days of bed rest following an emergency room visit.  The patient also reports that her employer has requested updated documentation for her accommodation request, which had previously been ignored. She had submitted a letter from her neurologist outlining her need for accommodations, but it was not acted upon. The patient has now resubmitted the necessary documentation and is awaiting a response.       Past Medical History:  Diagnosis Date   Anger    "explosive temper disorder"   Anxiety    Asthma    Depression    "extreme depression"   Lumbar back pain    Migraine    Stress    Uterine fibroids  affecting pregnancy    reports having a surgical procedure -- unsure what     Social History   Socioeconomic History   Marital status: Single    Spouse name: Not on file   Number of children: 2   Years of education: Not on file   Highest education level: Master's degree (e.g., MA, MS, MEng, MEd, MSW, MBA)  Occupational History   Occupation: Sales promotion account executive  Tobacco Use   Smoking status: Former    Years: 25    Types: Cigarettes    Quit date: 12/06/2018    Years since quitting: 3.7   Smokeless tobacco: Never  Vaping Use   Vaping Use: Never used  Substance and Sexual Activity   Alcohol use: No   Drug use: No   Sexual activity: Yes    Birth control/protection: Surgical  Other Topics Concern   Not on file  Social History Narrative   Lives with 74 yr old child    Reports being ambidextrous    Caffeine: coffee 8 cups/day, soda 20 oz x 20 per day ("drink like water")      04/14/22 no soda for last 3 weeks, drinking water instead    Social Determinants of Health   Financial Resource Strain: Medium Risk (08/27/2022)   Overall Financial Resource Strain (CARDIA)    Difficulty of Paying Living Expenses: Somewhat hard  Food Insecurity: Food Insecurity Present (08/27/2022)   Hunger Vital Sign    Worried About Running Out of Food in the Last Year: Often true    Ran Out of Food in the  Last Year: Often true  Transportation Needs: No Transportation Needs (08/27/2022)   PRAPARE - Administrator, Civil Service (Medical): No    Lack of Transportation (Non-Medical): No  Physical Activity: Sufficiently Active (08/27/2022)   Exercise Vital Sign    Days of Exercise per Week: 7 days    Minutes of Exercise per Session: 30 min  Stress: Stress Concern Present (08/27/2022)   [employer] of Occupational Health - Occupational Stress Questionnaire    Feeling of Stress : Very much  Social Connections: Socially Isolated (08/27/2022)   Social Connection and Isolation Panel [NHANES]     Frequency of Communication with Friends and Family: Once a week    Frequency of Social Gatherings with Friends and Family: Never    Attends Religious Services: 1 to 4 times per year    Active Member of [employer] or Organizations: No    Attends Engineer, structural: Not on file    Marital Status: Never married  Catering manager Violence: Not on file    Past Surgical History:  Procedure Laterality Date   ABDOMINAL HYSTERECTOMY     ABDOMINAL HYSTERECTOMY     pt states she cannot confirm or deny that she had this pressure   MYOMECTOMY     TONSILLECTOMY      Family History  Problem Relation Age of Onset   Pancreatic cancer Father    Bipolar disorder Sister    Liver cancer Sister    Schizophrenia Sister    Epilepsy Sister    Migraines Maternal Aunt    Kidney cancer Maternal Uncle    Alcoholism Maternal Uncle    Schizophrenia Paternal Aunt    Bipolar disorder Paternal Aunt    Schizophrenia Son    Ataxia Neg Hx    Chorea Neg Hx    Dementia Neg Hx    Mental retardation Neg Hx    Multiple sclerosis Neg Hx    Neurofibromatosis Neg Hx    Neuropathy Neg Hx    Parkinsonism Neg Hx    Seizures Neg Hx    Stroke Neg Hx     Allergies  Allergen Reactions   Other Anaphylaxis    IV CT Scan Contrast   Isovue [Iopamidol] Itching    Eye redness/swelling and itching post contrast, hives on upper back   Latex Rash    Current Outpatient Medications on File Prior to Visit  Medication Sig Dispense Refill   cyclobenzaprine (FLEXERIL) 5 MG tablet Take 1 tablet (5 mg total) by mouth at bedtime as needed for trapezius pain with headache. 5 tablet 0   Fremanezumab-vfrm (AJOVY) 225 MG/1.5ML SOAJ Inject 225 mg into the skin every 30 (thirty) days. 1.5 mL 11   ondansetron (ZOFRAN-ODT) 4 MG disintegrating tablet Take 1-2 tablets (4-8 mg total) by mouth every 8 (eight) hours as needed. 30 tablet 3   rizatriptan (MAXALT-MLT) 10 MG disintegrating tablet Take 1 tablet (10 mg total) by mouth as  needed for migraine. May repeat in 2 hours if needed 9 tablet 11   topiramate (TOPAMAX) 25 MG tablet Take 1 tablet (25 mg total) by mouth 2 (two) times daily. 60 tablet 0   Ubrogepant (UBRELVY) 100 MG TABS Take 1 tablet (100 mg total) by mouth every 2 (two) hours as needed. Maximum 200mg  a day. 16 tablet 11   zolmitriptan (ZOMIG) 5 MG tablet Take 1 tablet (5 mg total) by mouth as needed for migraine. Repeat in 2 hours if needed. Max 2 tablets in 24 hours.  10 tablet 0   No current facility-administered medications on file prior to visit.    BP (!) 152/72   Pulse (!) 56   Resp 18   Ht 5\' 6"  (1.676 m)   Wt 237 lb (107.5 kg)   LMP  (LMP Unknown)   SpO2 95%   BMI 38.25 kg/m     Review of Systems See hpi.    Objective:   Physical Exam  General Mental Status- Alert. General Appearance- Not in acute distress.    Chest and Lung Exam Auscultation: Breath Sounds:-Normal.  Cardiovascular Auscultation:Rythm- Regular.  Neurologic Cranial Nerve exam:- CN III-XII intact(No nystagmus), symmetric smile. Strength:- 5/5 equal and symmetric strength both upper and lower extremities.       Assessment & Plan:  Assessment and Plan    Migraine Headaches: Improved frequency and duration with current regimen (Ajovy and Topamax). Noted exacerbation of symptoms in office environment due to triggers (fluorescent light, noise, perfume). Recent missed doses of Ajovy led to increased frequency of migraines and missed work days. -Continue Ajovy and Topamax as prescribed by neurologist. -Use Flexeril as needed for tension headache component. -Completed accommodation request form for employer to consider changes in office environment or allow work from home. -Check in with employer regarding accommodation request. -Follow up in 2 months or sooner if needed, and maintain appointments with neurologist.        Esperanza Richters, PA-C    Time spent with patient today was  49 minutes which consisted of  chart revdiew, discussing diagnosis, work up, treatment and documentation. Filled out form on site. Lengthy and complex form filled out at request /recommendation of neurologist.

## 2022-08-29 ENCOUNTER — Other Ambulatory Visit: Payer: Self-pay | Admitting: Medical

## 2022-09-01 ENCOUNTER — Other Ambulatory Visit (HOSPITAL_COMMUNITY): Payer: Self-pay

## 2022-09-01 MED ORDER — CYCLOBENZAPRINE HCL 5 MG PO TABS
5.0000 mg | ORAL_TABLET | Freq: Every evening | ORAL | 1 refills | Status: DC | PRN
  Filled 2022-09-01: qty 5, 5d supply, fill #0
  Filled 2022-09-30: qty 5, 5d supply, fill #1

## 2022-09-01 MED ORDER — TOPIRAMATE 25 MG PO TABS
25.0000 mg | ORAL_TABLET | Freq: Two times a day (BID) | ORAL | 11 refills | Status: AC
  Filled 2022-09-01: qty 60, 30d supply, fill #0
  Filled 2022-09-30: qty 60, 30d supply, fill #1
  Filled 2022-10-31: qty 60, 30d supply, fill #2
  Filled 2022-11-20 – 2022-11-25 (×2): qty 60, 30d supply, fill #3
  Filled 2023-01-01: qty 60, 30d supply, fill #4
  Filled 2023-02-12: qty 60, 30d supply, fill #5

## 2022-09-01 NOTE — Telephone Encounter (Signed)
Forms faxed

## 2022-09-01 NOTE — Telephone Encounter (Signed)
Rx refill sent to pt pharmacy 

## 2022-09-02 ENCOUNTER — Other Ambulatory Visit: Payer: Self-pay

## 2022-09-02 ENCOUNTER — Other Ambulatory Visit (HOSPITAL_COMMUNITY): Payer: Self-pay

## 2022-09-15 ENCOUNTER — Other Ambulatory Visit: Payer: Self-pay

## 2022-09-16 ENCOUNTER — Other Ambulatory Visit: Payer: Self-pay

## 2022-10-01 ENCOUNTER — Other Ambulatory Visit: Payer: Self-pay

## 2022-10-12 ENCOUNTER — Other Ambulatory Visit: Payer: Self-pay | Admitting: Medical

## 2022-10-13 ENCOUNTER — Other Ambulatory Visit: Payer: Self-pay

## 2022-10-13 ENCOUNTER — Other Ambulatory Visit (HOSPITAL_COMMUNITY): Payer: Self-pay

## 2022-10-13 MED ORDER — CYCLOBENZAPRINE HCL 5 MG PO TABS
5.0000 mg | ORAL_TABLET | Freq: Every evening | ORAL | 1 refills | Status: DC | PRN
  Filled 2022-10-13: qty 5, 5d supply, fill #0
  Filled 2022-10-31: qty 5, 5d supply, fill #1

## 2022-10-16 ENCOUNTER — Other Ambulatory Visit: Payer: Self-pay

## 2022-10-23 ENCOUNTER — Other Ambulatory Visit (HOSPITAL_COMMUNITY): Payer: Self-pay

## 2022-10-24 ENCOUNTER — Other Ambulatory Visit: Payer: Self-pay

## 2022-11-01 ENCOUNTER — Other Ambulatory Visit (HOSPITAL_COMMUNITY): Payer: Self-pay

## 2022-11-20 ENCOUNTER — Other Ambulatory Visit: Payer: Self-pay | Admitting: Family

## 2022-11-20 ENCOUNTER — Other Ambulatory Visit (HOSPITAL_COMMUNITY): Payer: Self-pay

## 2022-11-21 ENCOUNTER — Other Ambulatory Visit: Payer: Self-pay

## 2022-12-03 ENCOUNTER — Encounter: Payer: Self-pay | Admitting: Neurology

## 2022-12-03 ENCOUNTER — Ambulatory Visit (INDEPENDENT_AMBULATORY_CARE_PROVIDER_SITE_OTHER): Payer: No Typology Code available for payment source | Admitting: Medical

## 2022-12-03 ENCOUNTER — Other Ambulatory Visit (HOSPITAL_BASED_OUTPATIENT_CLINIC_OR_DEPARTMENT_OTHER): Payer: Self-pay

## 2022-12-03 ENCOUNTER — Encounter: Payer: Self-pay | Admitting: Medical

## 2022-12-03 VITALS — BP 116/70 | HR 62 | Temp 98.1°F | Resp 18 | Ht 66.0 in | Wt 232.8 lb

## 2022-12-03 DIAGNOSIS — G44209 Tension-type headache, unspecified, not intractable: Secondary | ICD-10-CM

## 2022-12-03 DIAGNOSIS — G43701 Chronic migraine without aura, not intractable, with status migrainosus: Secondary | ICD-10-CM

## 2022-12-03 DIAGNOSIS — G43009 Migraine without aura, not intractable, without status migrainosus: Secondary | ICD-10-CM

## 2022-12-03 MED ORDER — KETOROLAC TROMETHAMINE 30 MG/ML IJ SOLN
30.0000 mg | Freq: Once | INTRAMUSCULAR | Status: AC
Start: 2022-12-03 — End: 2022-12-03
  Administered 2022-12-03: 30 mg via INTRAMUSCULAR

## 2022-12-03 MED ORDER — UBRELVY 100 MG PO TABS
100.0000 mg | ORAL_TABLET | ORAL | 11 refills | Status: AC | PRN
Start: 2022-12-03 — End: ?

## 2022-12-03 MED ORDER — CYCLOBENZAPRINE HCL 5 MG PO TABS
5.0000 mg | ORAL_TABLET | Freq: Every evening | ORAL | 1 refills | Status: AC | PRN
  Filled 2022-12-03: qty 12, 12d supply, fill #0
  Filled 2023-01-01: qty 12, 12d supply, fill #1

## 2022-12-03 NOTE — Patient Instructions (Addendum)
Migraine Persistent headache for the past three days with typical migraine features. Patient is on Ajovy injection once a month and Topamax 25mg  twice daily. Patient also has Bernita Raisin for acute migraine attacks but is currently unable to refill this medication. -Administer Toradol 30mg  injection for current migraine. -Continue Ajovy monthly injections and Topamax 25mg  twice daily. -Coordinate with neurologist's office to facilitate Ubrelvy refill, possibly requiring prior authorization. -If ha with motor or sensory deficits be seen in ED. -keep follow up with neurologist as regularly scheduled.  Tension Headache Occasional tension headache component noted, with patient using cyclobenzaprine (Flexeril) 5mg  as needed. -Continue cyclobenzaprine (Flexeril) 5mg  as needed for tension headaches. -Provide 12 tablets for monthly use(if needed sparingly. late in day when planning not to drive) -Advise patient to evaluate headache type and use appropriate medication.  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 Previous attempts to modify work environment have been partially successful. Patient continue to think work at home would be beneficial  -Continue to advocate for further work environment modifications to reduce migraine triggers.   Follow up as regularly scheduled with neurologist or sooner if needed.  On discussion asking you to go ahead and get scheduled for wellness exam for  fasting labs. Today recommended referral for colonosocpy and  you declined. Please reconsider discuss further on wellness exam.

## 2022-12-03 NOTE — Progress Notes (Signed)
Subjective:    Patient ID: Shirley [name], female    DOB: [birth year], 47 y.o.   MRN: [id number]  HPI  Discussed the use of AI scribe software for clinical note transcription with the patient, who gave verbal consent to proceed.  History of Present Illness   The patient, with a history of chronic migraines, reports a recent exacerbation of her typical migraine symptoms, including light sensitivity and nausea, over the past three days. moderate to severe intensitvity but no motor or sensory deficits. She attributes this increase in intensity to a recent change in work hours and a seven-day work stretch. Despite previous attempts to modify her work environment, including the provision of black screen protectors and a stand-up desk, the patient's migraines persist. The stand-up desk reportedly exacerbates the migraines due to proximity to overhead lighting. Other accomadation not granted per pt.  The patient is currently on a regimen of Ajovy injections once a month and Ubrelvy for acute migraine attacks, both prescribed by a neurologist. However, she reports difficulty in obtaining a refill of Ubrelvy. She also takes Topamax 25 mg twice daily. The patient notes that the Ajovy injections cause drowsiness, necessitating administration on her day off.  In addition to migraines, the patient also experiences occasional tension headaches, for which she takes cyclobenzaprine (Flexeril) 5 mg as needed. She reports that these tension headaches often present with trapezius pain.       Review of Systems  Constitutional:  Negative for chills, fatigue and fever.  Respiratory:  Negative for cough, chest tightness and wheezing.   Cardiovascular:  Negative for chest pain and palpitations.  Gastrointestinal:  Negative for abdominal pain.  Musculoskeletal:  Negative for back pain.  Neurological:  Positive for headaches. Negative for dizziness, weakness and light-headedness.       Light and sound senstive.   Hematological:  Negative for adenopathy.  Psychiatric/Behavioral:  Negative for suicidal ideas. The patient is not nervous/anxious.        Stress related to ha.    Past Medical History:  Diagnosis Date   Anger    "explosive temper disorder"   Anxiety    Asthma    Depression    "extreme depression"   Lumbar back pain    Migraine    Stress    Uterine fibroids affecting pregnancy    reports having a surgical procedure -- unsure what     Social History   Socioeconomic History   Marital status: Single    Spouse name: Not on file   Number of children: 2   Years of education: Not on file   Highest education level: Master's degree (e.g., MA, MS, MEng, MEd, MSW, MBA)  Occupational History   Occupation: Sales promotion account executive  Tobacco Use   Smoking status: Former    Current packs/day: 0.00    Types: Cigarettes    Start date: 12/05/1993    Quit date: 12/06/2018    Years since quitting: 3.9   Smokeless tobacco: Never  Vaping Use   Vaping status: Never Used  Substance and Sexual Activity   Alcohol use: No   Drug use: No   Sexual activity: Yes    Birth control/protection: Surgical  Other Topics Concern   Not on file  Social History Narrative   Lives with 75 yr old child    Reports being ambidextrous    Caffeine: coffee 8 cups/day, soda 20 oz x 20 per day ("drink like water")      04/14/22 no soda for last  3 weeks, drinking water instead    Social Determinants of Health   Financial Resource Strain: Medium Risk (08/27/2022)   Overall Financial Resource Strain (CARDIA)    Difficulty of Paying Living Expenses: Somewhat hard  Food Insecurity: Food Insecurity Present (08/27/2022)   Hunger Vital Sign    Worried About Running Out of Food in the Last Year: Often true    Ran Out of Food in the Last Year: Often true  Transportation Needs: No Transportation Needs (08/27/2022)   PRAPARE - Administrator, Civil Service (Medical): No    Lack of Transportation (Non-Medical):  No  Physical Activity: Sufficiently Active (08/27/2022)   Exercise Vital Sign    Days of Exercise per Week: 7 days    Minutes of Exercise per Session: 30 min  Stress: Stress Concern Present (08/27/2022)   [employer] of Occupational Health - Occupational Stress Questionnaire    Feeling of Stress : Very much  Social Connections: Socially Isolated (08/27/2022)   Social Connection and Isolation Panel [NHANES]    Frequency of Communication with Friends and Family: Once a week    Frequency of Social Gatherings with Friends and Family: Never    Attends Religious Services: 1 to 4 times per year    Active Member of [employer] or Organizations: No    Attends Engineer, structural: Not on file    Marital Status: Never married  Intimate Partner Violence: Unknown (06/21/2021)   Received from [employer], [facility]   HITS    Physically Hurt: Not on file    Insult or Talk Down To: Not on file    Threaten Physical Harm: Not on file    Scream or Curse: Not on file    Past Surgical History:  Procedure Laterality Date   ABDOMINAL HYSTERECTOMY     ABDOMINAL HYSTERECTOMY     pt states she cannot confirm or deny that she had this pressure   MYOMECTOMY     TONSILLECTOMY      Family History  Problem Relation Age of Onset   Pancreatic cancer Father    Bipolar disorder Sister    Liver cancer Sister    Schizophrenia Sister    Epilepsy Sister    Migraines Maternal Aunt    Kidney cancer Maternal Uncle    Alcoholism Maternal Uncle    Schizophrenia Paternal Aunt    Bipolar disorder Paternal Aunt    Schizophrenia Son    Ataxia Neg Hx    Chorea Neg Hx    Dementia Neg Hx    Mental retardation Neg Hx    Multiple sclerosis Neg Hx    Neurofibromatosis Neg Hx    Neuropathy Neg Hx    Parkinsonism Neg Hx    Seizures Neg Hx    Stroke Neg Hx     Allergies  Allergen Reactions   Other Anaphylaxis    IV CT Scan Contrast   Isovue [Iopamidol] Itching    Eye redness/swelling and itching  post contrast, hives on upper back   Latex Rash    Current Outpatient Medications on File Prior to Visit  Medication Sig Dispense Refill   Fremanezumab-vfrm (AJOVY) 225 MG/1.5ML SOAJ Inject 225 mg into the skin every 30 (thirty) days. 1.5 mL 11   ondansetron (ZOFRAN-ODT) 4 MG disintegrating tablet Take 1-2 tablets (4-8 mg total) by mouth every 8 (eight) hours as needed. 30 tablet 3   rizatriptan (MAXALT-MLT) 10 MG disintegrating tablet Take 1 tablet (10 mg total) by mouth as needed  for migraine. May repeat in 2 hours if needed 9 tablet 11   topiramate (TOPAMAX) 25 MG tablet Take 1 tablet (25 mg total) by mouth 2 (two) times daily. 60 tablet 11   Ubrogepant (UBRELVY) 100 MG TABS Take 1 tablet (100 mg total) by mouth every 2 (two) hours as needed. Maximum 200mg  a day. 16 tablet 11   zolmitriptan (ZOMIG) 5 MG tablet Take 1 tablet (5 mg total) by mouth as needed for migraine. Repeat in 2 hours if needed. Max 2 tablets in 24 hours. 10 tablet 0   No current facility-administered medications on file prior to visit.    BP 116/70 (BP Location: Left Arm, Patient Position: Sitting, Cuff Size: Large)   Pulse 62   Temp 98.1 F (36.7 C) (Oral)   Resp 18   Ht 5\' 6"  (1.676 m)   Wt 232 lb 12.8 oz (105.6 kg)   SpO2 97%   BMI 37.57 kg/m        Objective:   Physical Exam  General Mental Status- Alert. General Appearance- Not in acute distress.   Skin General: Color- Normal Color. Moisture- Normal Moisture.  Neck Carotid Arteries- Normal color. Moisture- Normal Moisture. No carotid bruits. No JVD.  Chest and Lung Exam Auscultation: Breath Sounds:-Normal.  Cardiovascular Auscultation:Rythm- Regular. Murmurs & Other Heart Sounds:Auscultation of the heart reveals- No Murmurs.  Abdomen Inspection:-Inspeection Normal. Palpation/Percussion:Note:No mass. Palpation and Percussion of the abdomen reveal- Non Tender, Non Distended + BS, no rebound or guarding.   Neurologic Cranial Nerve exam:-  CN III-XII intact(No nystagmus), symmetric smile. Light sensitivity. Drift Test:- No drift. Finger to Nose:- Normal/Intact Strength:- 5/5 equal and symmetric strength both upper and lower extremities.       Assessment & Plan:   Assessment and Plan    Migraine Persistent headache for the past three days with typical migraine features. Patient is on Ajovy injection once a month and Topamax 25mg  twice daily. Patient also has Bernita Raisin for acute migraine attacks but is currently unable to refill this medication. -Administer Toradol 30mg  injection for current migraine. -Continue Ajovy monthly injections and Topamax 25mg  twice daily. -Coordinate with neurologist's office to facilitate Ubrelvy refill, possibly requiring prior authorization. -If ha with motor or sensory deficits be seen in ED. -keep follow up with neurologist as regularly scheduled.  Tension Headache Occasional tension headache component noted, with patient using cyclobenzaprine (Flexeril) 5mg  as needed. -Continue cyclobenzaprine (Flexeril) 5mg  as needed for tension headaches. -Provide 12 tablets for monthly use(if needed sparingly. late in day when planning not to drive) -Advise patient to evaluate headache type and use appropriate medication.  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 Previous attempts to modify work environment have been partially successful. Patient continue to think work at home would be beneficial  -Continue to advocate for further work environment modifications to reduce migraine triggers.   Follow up as regularly scheduled with neurologist or sooner if needed.        Esperanza Richters, PA-C

## 2022-12-17 ENCOUNTER — Other Ambulatory Visit: Payer: Self-pay

## 2023-01-01 ENCOUNTER — Other Ambulatory Visit: Payer: Self-pay

## 2023-01-14 ENCOUNTER — Telehealth: Payer: Self-pay | Admitting: *Deleted

## 2023-01-14 NOTE — Telephone Encounter (Signed)
Bernita Raisin PA needed. Pt trying to fill at pharmacy.

## 2023-01-16 ENCOUNTER — Other Ambulatory Visit (HOSPITAL_COMMUNITY): Payer: Self-pay

## 2023-01-16 NOTE — Telephone Encounter (Signed)
     I got a paid successful claim-no PA needed.

## 2023-01-19 NOTE — Telephone Encounter (Signed)
Noted  

## 2023-01-27 ENCOUNTER — Other Ambulatory Visit (HOSPITAL_COMMUNITY): Payer: Self-pay

## 2023-02-13 ENCOUNTER — Other Ambulatory Visit (HOSPITAL_COMMUNITY): Payer: Self-pay

## 2023-02-13 ENCOUNTER — Other Ambulatory Visit: Payer: Self-pay

## 2023-02-20 ENCOUNTER — Other Ambulatory Visit: Payer: Self-pay

## 2023-03-20 ENCOUNTER — Other Ambulatory Visit: Payer: Self-pay

## 2023-03-20 ENCOUNTER — Other Ambulatory Visit (HOSPITAL_COMMUNITY): Payer: Self-pay

## 2023-03-22 ENCOUNTER — Other Ambulatory Visit (HOSPITAL_COMMUNITY): Payer: Self-pay

## 2023-03-23 ENCOUNTER — Other Ambulatory Visit: Payer: Self-pay

## 2023-03-25 ENCOUNTER — Encounter: Payer: Self-pay | Admitting: Neurology

## 2023-03-25 ENCOUNTER — Encounter: Payer: Self-pay | Admitting: *Deleted

## 2023-03-25 ENCOUNTER — Other Ambulatory Visit (HOSPITAL_COMMUNITY): Payer: Self-pay

## 2023-03-25 ENCOUNTER — Other Ambulatory Visit: Payer: Self-pay

## 2023-03-25 ENCOUNTER — Encounter (HOSPITAL_COMMUNITY): Payer: Self-pay

## 2023-03-26 ENCOUNTER — Other Ambulatory Visit: Payer: Self-pay

## 2023-04-02 ENCOUNTER — Other Ambulatory Visit (HOSPITAL_COMMUNITY): Payer: Self-pay

## 2023-04-02 NOTE — Telephone Encounter (Signed)
LMVM for pt that  calling as had not received response from email , after her initial email from [place].

## 2023-04-12 ENCOUNTER — Other Ambulatory Visit (HOSPITAL_COMMUNITY): Payer: Self-pay

## 2023-04-13 ENCOUNTER — Other Ambulatory Visit: Payer: Self-pay

## 2023-04-16 ENCOUNTER — Other Ambulatory Visit: Payer: Self-pay

## 2023-04-29 ENCOUNTER — Other Ambulatory Visit (HOSPITAL_BASED_OUTPATIENT_CLINIC_OR_DEPARTMENT_OTHER): Payer: Self-pay

## 2023-04-29 ENCOUNTER — Encounter: Payer: Self-pay | Admitting: Medical

## 2023-04-29 ENCOUNTER — Other Ambulatory Visit (HOSPITAL_COMMUNITY): Payer: Self-pay

## 2023-04-29 ENCOUNTER — Other Ambulatory Visit: Payer: Self-pay

## 2023-04-30 ENCOUNTER — Other Ambulatory Visit: Payer: Self-pay

## 2023-04-30 ENCOUNTER — Other Ambulatory Visit (HOSPITAL_COMMUNITY): Payer: Self-pay

## 2023-05-04 ENCOUNTER — Encounter: Payer: Self-pay | Admitting: *Deleted

## 2023-05-06 ENCOUNTER — Other Ambulatory Visit: Payer: Self-pay

## 2023-05-19 NOTE — Telephone Encounter (Signed)
 Spoke with Dr Lucia Gaskins. Ok to fill out fmla for this new job. Pt will be due for appt June however. She does not have one scheduled. Doesn't look like we have received pmt yet for the form. I called pt to discuss. Also wanted to clarify if she is requesting intermittent days off for migraine vs work from home. We had written this accomodation request before but she sent a message in January stating she could no longer do this because her job determined her condition wasn't severe enough.

## 2023-05-20 NOTE — Telephone Encounter (Signed)
 Patient paid form fee. Placing in POD 4 to review

## 2023-05-26 NOTE — Telephone Encounter (Signed)
 Just tried to call patient again but couldn't reach her. LVM asking her to check her mychart message and call us back. See above if patient calls back. Will hold on finishing her forms right now since we still haven't heard from her.

## 2023-07-11 ENCOUNTER — Other Ambulatory Visit (HOSPITAL_COMMUNITY): Payer: Self-pay

## 2023-07-11 ENCOUNTER — Encounter: Payer: Self-pay | Admitting: Neurology

## 2023-07-14 DIAGNOSIS — Z0289 Encounter for other administrative examinations: Secondary | ICD-10-CM
